# Patient Record
Sex: Female | Born: 1945 | Race: White | Hispanic: No | Smoking: Never smoker
Health system: Southern US, Community
[De-identification: ages and names within clinical notes are randomized; demographics above are authoritative.]

## PROBLEM LIST (undated history)

## (undated) ENCOUNTER — Emergency Department (HOSPITAL_COMMUNITY): Discharge status: Absent without leave | Payer: Medicare Other

## (undated) DIAGNOSIS — G459 Transient cerebral ischemic attack, unspecified: Secondary | ICD-10-CM

## (undated) DIAGNOSIS — I10 Essential (primary) hypertension: Secondary | ICD-10-CM

## (undated) DIAGNOSIS — E559 Vitamin D deficiency, unspecified: Secondary | ICD-10-CM

## (undated) DIAGNOSIS — M75100 Unspecified rotator cuff tear or rupture of unspecified shoulder, not specified as traumatic: Secondary | ICD-10-CM

## (undated) DIAGNOSIS — D649 Anemia, unspecified: Secondary | ICD-10-CM

## (undated) DIAGNOSIS — E039 Hypothyroidism, unspecified: Secondary | ICD-10-CM

## (undated) DIAGNOSIS — N200 Calculus of kidney: Secondary | ICD-10-CM

## (undated) DIAGNOSIS — E785 Hyperlipidemia, unspecified: Secondary | ICD-10-CM

## (undated) DIAGNOSIS — J309 Allergic rhinitis, unspecified: Secondary | ICD-10-CM

## (undated) DIAGNOSIS — E079 Disorder of thyroid, unspecified: Secondary | ICD-10-CM

## (undated) DIAGNOSIS — E119 Type 2 diabetes mellitus without complications: Secondary | ICD-10-CM

## (undated) DIAGNOSIS — K219 Gastro-esophageal reflux disease without esophagitis: Secondary | ICD-10-CM

## (undated) DIAGNOSIS — I4891 Unspecified atrial fibrillation: Secondary | ICD-10-CM

## (undated) HISTORY — PX: COLONOSCOPY: SHX174

## (undated) HISTORY — PX: CHOLECYSTECTOMY: SHX55

---

## 2005-10-05 ENCOUNTER — Ambulatory Visit: Payer: Self-pay | Admitting: Nurse Practitioner

## 2011-11-25 ENCOUNTER — Inpatient Hospital Stay (HOSPITAL_COMMUNITY)
Admission: EM | Admit: 2011-11-25 | Discharge: 2011-11-26 | DRG: 309 | Disposition: A | Discharge status: Home / Self Care | Payer: Medicare Other | Attending: Internal Medicine | Admitting: Internal Medicine

## 2011-11-25 ENCOUNTER — Inpatient Hospital Stay (HOSPITAL_COMMUNITY): Payer: Medicare Other

## 2011-11-25 ENCOUNTER — Encounter (HOSPITAL_COMMUNITY): Payer: Self-pay

## 2011-11-25 ENCOUNTER — Emergency Department (HOSPITAL_COMMUNITY): Payer: Medicare Other

## 2011-11-25 DIAGNOSIS — E871 Hypo-osmolality and hyponatremia: Secondary | ICD-10-CM

## 2011-11-25 DIAGNOSIS — E861 Hypovolemia: Secondary | ICD-10-CM

## 2011-11-25 DIAGNOSIS — R110 Nausea: Secondary | ICD-10-CM

## 2011-11-25 DIAGNOSIS — D649 Anemia, unspecified: Secondary | ICD-10-CM | POA: Diagnosis present

## 2011-11-25 DIAGNOSIS — I10 Essential (primary) hypertension: Secondary | ICD-10-CM

## 2011-11-25 DIAGNOSIS — Z7982 Long term (current) use of aspirin: Secondary | ICD-10-CM

## 2011-11-25 DIAGNOSIS — E785 Hyperlipidemia, unspecified: Secondary | ICD-10-CM | POA: Diagnosis present

## 2011-11-25 DIAGNOSIS — Z8673 Personal history of transient ischemic attack (TIA), and cerebral infarction without residual deficits: Secondary | ICD-10-CM

## 2011-11-25 DIAGNOSIS — Z882 Allergy status to sulfonamides status: Secondary | ICD-10-CM

## 2011-11-25 DIAGNOSIS — R112 Nausea with vomiting, unspecified: Secondary | ICD-10-CM | POA: Diagnosis present

## 2011-11-25 DIAGNOSIS — M25559 Pain in unspecified hip: Secondary | ICD-10-CM | POA: Diagnosis present

## 2011-11-25 DIAGNOSIS — E559 Vitamin D deficiency, unspecified: Secondary | ICD-10-CM | POA: Diagnosis present

## 2011-11-25 DIAGNOSIS — E119 Type 2 diabetes mellitus without complications: Secondary | ICD-10-CM | POA: Diagnosis present

## 2011-11-25 DIAGNOSIS — Z79899 Other long term (current) drug therapy: Secondary | ICD-10-CM

## 2011-11-25 DIAGNOSIS — E860 Dehydration: Secondary | ICD-10-CM

## 2011-11-25 DIAGNOSIS — I4891 Unspecified atrial fibrillation: Secondary | ICD-10-CM

## 2011-11-25 DIAGNOSIS — Z794 Long term (current) use of insulin: Secondary | ICD-10-CM

## 2011-11-25 DIAGNOSIS — R197 Diarrhea, unspecified: Secondary | ICD-10-CM | POA: Diagnosis present

## 2011-11-25 DIAGNOSIS — I369 Nonrheumatic tricuspid valve disorder, unspecified: Secondary | ICD-10-CM

## 2011-11-25 DIAGNOSIS — K219 Gastro-esophageal reflux disease without esophagitis: Secondary | ICD-10-CM | POA: Diagnosis present

## 2011-11-25 HISTORY — DX: Anemia, unspecified: D64.9

## 2011-11-25 HISTORY — DX: Essential (primary) hypertension: I10

## 2011-11-25 HISTORY — DX: Calculus of kidney: N20.0

## 2011-11-25 HISTORY — DX: Vitamin D deficiency, unspecified: E55.9

## 2011-11-25 HISTORY — DX: Gastro-esophageal reflux disease without esophagitis: K21.9

## 2011-11-25 HISTORY — DX: Hyperlipidemia, unspecified: E78.5

## 2011-11-25 HISTORY — DX: Allergic rhinitis, unspecified: J30.9

## 2011-11-25 HISTORY — DX: Transient cerebral ischemic attack, unspecified: G45.9

## 2011-11-25 HISTORY — DX: Type 2 diabetes mellitus without complications: E11.9

## 2011-11-25 LAB — CARDIAC PANEL(CRET KIN+CKTOT+MB+TROPI)
CK, MB: 2.3 ng/mL (ref 0.3–4.0)
Troponin I: <0.3 ng/mL (ref <0.30)

## 2011-11-25 LAB — CBC WITH DIFFERENTIAL/PLATELET
Basophils Absolute: 0 10*3/uL (ref 0.0–0.1)
HCT: 37.6 % (ref 36.0–46.0)
Lymphocytes Relative: 16 % (ref 12–46)
Lymphs Abs: 2.1 10*3/uL (ref 0.7–4.0)
Monocytes Absolute: 0.9 10*3/uL (ref 0.1–1.0)
Neutro Abs: 10.3 10*3/uL — ABNORMAL HIGH (ref 1.7–7.7)
RBC: 4.22 MIL/uL (ref 3.87–5.11)
RDW: 13.8 % (ref 11.5–15.5)
WBC: 13.5 10*3/uL — ABNORMAL HIGH (ref 4.0–10.5)

## 2011-11-25 LAB — URINALYSIS, ROUTINE W REFLEX MICROSCOPIC
Nitrite: NEGATIVE
Specific Gravity, Urine: 1.025 (ref 1.005–1.030)
pH: 5.5 (ref 5.0–8.0)

## 2011-11-25 LAB — URINE MICROSCOPIC-ADD ON

## 2011-11-25 LAB — BASIC METABOLIC PANEL
CO2: 23 mEq/L (ref 19–32)
Chloride: 91 mEq/L — ABNORMAL LOW (ref 96–112)
Creatinine, Ser: 0.65 mg/dL (ref 0.50–1.10)
Sodium: 130 mEq/L — ABNORMAL LOW (ref 135–145)

## 2011-11-25 LAB — TROPONIN I: Troponin I: <0.3 ng/mL (ref <0.30)

## 2011-11-25 LAB — HEPATIC FUNCTION PANEL
ALT: 9 U/L (ref 0–35)
AST: 11 U/L (ref 0–37)
Bilirubin, Direct: <0.1 mg/dL (ref 0.0–0.3)
Total Bilirubin: 0.4 mg/dL (ref 0.3–1.2)

## 2011-11-25 LAB — GLUCOSE, CAPILLARY

## 2011-11-25 MED ORDER — POTASSIUM CHLORIDE IN NACL 40-0.9 MEQ/L-% IV SOLN: INTRAVENOUS | Status: DC

## 2011-11-25 MED ORDER — MAGNESIUM SULFATE 40 MG/ML IJ SOLN
1.0000 g | INTRAMUSCULAR | Status: AC
  Administered 2011-11-25: 1 g via INTRAVENOUS
  Filled 2011-11-25: qty 50

## 2011-11-25 MED ORDER — SODIUM CHLORIDE 0.9 % IV BOLUS (SEPSIS)
500.0000 mL | Freq: Once | INTRAVENOUS | Status: AC
  Administered 2011-11-25: 500 mL via INTRAVENOUS

## 2011-11-25 MED ORDER — DIGOXIN 0.25 MG/ML IJ SOLN
0.1250 mg | Freq: Once | INTRAMUSCULAR | Status: AC
  Administered 2011-11-25: 0.125 mg via INTRAVENOUS
  Filled 2011-11-25: qty 2

## 2011-11-25 MED ORDER — METOPROLOL TARTRATE 50 MG PO TABS
25.0000 mg | ORAL_TABLET | Freq: Once | ORAL | Status: AC
  Administered 2011-11-25: 25 mg via ORAL
  Filled 2011-11-25: qty 1

## 2011-11-25 MED ORDER — CARVEDILOL 12.5 MG PO TABS
25.0000 mg | ORAL_TABLET | Freq: Two times a day (BID) | ORAL | Status: DC
  Administered 2011-11-25 – 2011-11-26 (×2): 25 mg via ORAL
  Filled 2011-11-25 (×2): qty 2

## 2011-11-25 MED ORDER — LEVALBUTEROL HCL 0.63 MG/3ML IN NEBU: 0.6300 mg | INHALATION_SOLUTION | Freq: Four times a day (QID) | RESPIRATORY_TRACT | Status: DC | PRN

## 2011-11-25 MED ORDER — PANTOPRAZOLE SODIUM 40 MG PO TBEC
40.0000 mg | DELAYED_RELEASE_TABLET | Freq: Every day | ORAL | Status: DC
  Administered 2011-11-25 – 2011-11-26 (×2): 40 mg via ORAL
  Filled 2011-11-25 (×2): qty 1

## 2011-11-25 MED ORDER — ONDANSETRON HCL 4 MG/2ML IJ SOLN: 4.0000 mg | Freq: Four times a day (QID) | INTRAMUSCULAR | Status: DC | PRN

## 2011-11-25 MED ORDER — METOPROLOL TARTRATE 1 MG/ML IV SOLN: 5.0000 mg | INTRAVENOUS | Status: DC | PRN

## 2011-11-25 MED ORDER — ENOXAPARIN SODIUM 40 MG/0.4ML ~~LOC~~ SOLN
40.0000 mg | SUBCUTANEOUS | Status: DC
  Administered 2011-11-25: 40 mg via SUBCUTANEOUS
  Filled 2011-11-25: qty 0.4

## 2011-11-25 MED ORDER — DICLOFENAC SODIUM 1 % TD GEL
Freq: Three times a day (TID) | TRANSDERMAL | Status: DC
  Administered 2011-11-25: 22:00:00 via TOPICAL
  Filled 2011-11-25: qty 100

## 2011-11-25 MED ORDER — POTASSIUM CHLORIDE CRYS ER 20 MEQ PO TBCR
30.0000 meq | EXTENDED_RELEASE_TABLET | Freq: Once | ORAL | Status: AC
  Administered 2011-11-25: 30 meq via ORAL
  Filled 2011-11-25: qty 2

## 2011-11-25 MED ORDER — MAGNESIUM SULFATE 50 % IJ SOLN: 1.0000 g | INTRAVENOUS | Status: DC

## 2011-11-25 MED ORDER — POTASSIUM CHLORIDE IN NACL 20-0.9 MEQ/L-% IV SOLN
INTRAVENOUS | Status: DC
  Administered 2011-11-25 – 2011-11-26 (×3): via INTRAVENOUS

## 2011-11-25 MED ORDER — ACETAMINOPHEN 650 MG RE SUPP: 650.0000 mg | Freq: Four times a day (QID) | RECTAL | Status: DC | PRN

## 2011-11-25 MED ORDER — MAGNESIUM SULFATE IN D5W 10-5 MG/ML-% IV SOLN: 1.0000 g | INTRAVENOUS | Status: DC

## 2011-11-25 MED ORDER — INSULIN ASPART 100 UNIT/ML ~~LOC~~ SOLN
0.0000 [IU] | Freq: Three times a day (TID) | SUBCUTANEOUS | Status: DC
  Administered 2011-11-25 – 2011-11-26 (×3): 3 [IU] via SUBCUTANEOUS

## 2011-11-25 MED ORDER — DIGOXIN 125 MCG PO TABS
0.1250 mg | ORAL_TABLET | Freq: Every day | ORAL | Status: DC
  Administered 2011-11-25 – 2011-11-26 (×2): 0.125 mg via ORAL
  Filled 2011-11-25 (×2): qty 1

## 2011-11-25 MED ORDER — ACETAMINOPHEN 325 MG PO TABS
650.0000 mg | ORAL_TABLET | Freq: Four times a day (QID) | ORAL | Status: DC | PRN
  Administered 2011-11-25 – 2011-11-26 (×2): 650 mg via ORAL
  Filled 2011-11-25 (×2): qty 2

## 2011-11-25 MED ORDER — ASPIRIN EC 81 MG PO TBEC
81.0000 mg | DELAYED_RELEASE_TABLET | Freq: Every day | ORAL | Status: DC
  Administered 2011-11-25 – 2011-11-26 (×2): 81 mg via ORAL
  Filled 2011-11-25: qty 1

## 2011-11-25 MED ORDER — MORPHINE SULFATE 2 MG/ML IJ SOLN: 2.0000 mg | INTRAMUSCULAR | Status: DC | PRN

## 2011-11-25 MED ORDER — INSULIN DETEMIR 100 UNIT/ML ~~LOC~~ SOLN
20.0000 [IU] | Freq: Every day | SUBCUTANEOUS | Status: DC
  Administered 2011-11-25: 20 [IU] via SUBCUTANEOUS
  Filled 2011-11-25: qty 10

## 2011-11-25 MED ORDER — INSULIN ASPART 100 UNIT/ML ~~LOC~~ SOLN: 0.0000 [IU] | Freq: Every day | SUBCUTANEOUS | Status: DC

## 2011-11-25 MED ORDER — METOPROLOL TARTRATE 1 MG/ML IV SOLN
5.0000 mg | Freq: Once | INTRAVENOUS | Status: AC
  Administered 2011-11-25: 5 mg via INTRAVENOUS
  Filled 2011-11-25: qty 5

## 2011-11-25 MED ORDER — HYDROCODONE-ACETAMINOPHEN 5-325 MG PO TABS
1.0000 | ORAL_TABLET | Freq: Four times a day (QID) | ORAL | Status: DC | PRN
  Administered 2011-11-25 – 2011-11-26 (×3): 1 via ORAL
  Filled 2011-11-25 (×3): qty 1

## 2011-11-25 MED ORDER — DILTIAZEM HCL 100 MG IV SOLR
5.0000 mg/h | INTRAVENOUS | Status: DC
  Filled 2011-11-25: qty 100

## 2011-11-25 MED ORDER — DICLOFENAC SODIUM 1 % TD GEL
1.0000 "application " | Freq: Three times a day (TID) | TRANSDERMAL | Status: DC
  Administered 2011-11-25: 1 via TOPICAL
  Filled 2011-11-25: qty 100

## 2011-11-25 NOTE — H&P (Signed)
TU SHIMMEL MRN: [id number] DOB/AGE: 1945-07-17 66 y.o. Primary Care Physician:KATHY PATTERSON. FNP. [facility]. Admit date: 11/25/2011 Chief Complaint: Heart racing, nausea, lightheadedness, and diarrhea. HPI: The patient is a 66 year old woman with a history significant for type 2 diabetes mellitus, hypertension, and hyperlipidemia, who presents to the hospital today with a chief complaint of a sensation of her heart racing, intermittent nausea, lightheadedness, and diarrhea. She has felt her heart racing on and off for years but it would returns to normal when she rested. She has lightheadedness and shortness of breath when she feels her heart racing. She denies associated chest pain. She began feeling nauseated 2-3 days ago when she was sitting in a hot parked car. She has not vomited. She denies abdominal pain. She has had diarrhea which started 2 or 3 days ago. She has had 4-5 loose or watery stools each day. She denies black tarry stools. Occasionally she will see bright red blood, but she thinks that it may have been secondary to drinking tomato juice. She denies recent antibiotic use or travel outside of the country. Her husband is in the nursing home, and apparently, there is a diarrheal illness for some of the residents there. She denies fever but she has had intermittent chills. She denies headache, passing out spells, and swelling in her legs or ankles. She hasn't checked her blood sugars in several days, but has not had any recent low blood sugars. She has had fatigue.  In the emergency department, she is afebrile and tachycardic. Her heart rate is ranging from 100-135 beats per minute. Her blood pressure is within normal limits. She is oxygenating 99% on room air. Her lab data are significant for a WBC of 13.5, sodium of 130, normal troponin I., glucose of 184, and magnesium of 1.4. Her EKG reveals atrial fibrillation with a heart rate of 121 beats per minute and nonspecific ST  and T-wave abnormalities. Her chest x-ray reveals no acute cardiopulmonary abnormalities. She is being admitted for further evaluation and management.  Past Medical History  Diagnosis Date  . DM (diabetes mellitus), type 2   . Hypertension   . Hyperlipidemia   . GERD (gastroesophageal reflux disease)   . Vitamin d deficiency   . Allergic rhinitis   . Nephrolithiasis   . Transient ischemic attack (TIA)   . Anemia     Past Surgical History  Procedure Date  . Cholecystectomy     Prior to Admission medications   Medication Sig Start Date End Date Taking? Authorizing Provider  aspirin EC 81 MG tablet Take 81 mg by mouth daily.   Yes Historical Provider, MD  benazepril (LOTENSIN) 40 MG tablet Take 40 mg by mouth daily.   Yes Historical Provider, MD  carvedilol (COREG) 25 MG tablet Take 25 mg by mouth 2 (two) times daily with a meal.   Yes Historical Provider, MD  chlorthalidone (HYGROTON) 25 MG tablet Take 25 mg by mouth daily.   Yes Historical Provider, MD  HYDROcodone-acetaminophen (NORCO) 5-325 MG per tablet Take 1 tablet by mouth every 6 (six) hours as needed. For pain   Yes Historical Provider, MD  insulin detemir (LEVEMIR) 100 UNIT/ML injection Inject 20 Units into the skin at bedtime.   Yes Historical Provider, MD  metFORMIN (GLUCOPHAGE) 1000 MG tablet Take 1,000 mg by mouth 2 (two) times daily with a meal.   Yes Historical Provider, MD  omeprazole (PRILOSEC) 40 MG capsule Take 40 mg by mouth daily.   Yes Historical Provider,  MD  pravastatin (PRAVACHOL) 40 MG tablet Take 80 mg by mouth daily.   Yes Historical Provider, MD  Vitamin D, Ergocalciferol, (DRISDOL) 50000 UNITS CAPS Take 50,000 Units by mouth every 7 (seven) days.   Yes Historical Provider, MD    Allergies:  Allergies  Allergen Reactions  . Angiotensin Receptor Blockers   . Calcium Channel Blockers   . Simvastatin   . Sulfa Antibiotics Itching and Rash    Family history: Her father is 18 years old. He has  diabetes mellitus. Her mother died of Alzheimer's disease at 84 years of age.   Social History: She is married. She lives in [place]. She has 3 children. She is employed as a Solicitor at [employer]. She denies tobacco, alcohol, and illicit drug use.           ROS: As above in history present illness, otherwise negative.  PHYSICAL EXAM: Blood pressure 120/64, pulse 135, temperature 97.5 F (36.4 C), temperature source Oral, resp. rate 22, height 5\' 3"  (1.6 m), weight 65.318 kg (144 lb), SpO2 99.00%. General: Pleasant alert overweight 66 year old Caucasian woman, sitting up in bed, in no acute distress. HEENT: Head is normocephalic, nontraumatic. Pupils are equal, round, and reactive to light. Extraocular movements are intact. Conjunctivae are clear. Sclerae are white. Oropharynx reveals mildly dry mucous membranes. No posterior exudates or erythema. Neck: Supple, no adenopathy, no thyromegaly, no JVD. Lungs: Decreased breath sounds in the bases, otherwise clear. Heart: Irregular, irregular, with tachycardia. Abdomen: Mildly obese, positive bowel sounds, soft, nontender, nondistended. GU and rectal are deferred. Extremities: No acute hot red joints. Pedal pulses palpable bilaterally. No pedal edema. Neurologic: She is alert and oriented x3. Cranial nerves II through XII are intact. Strength is 5 over 5 throughout. Sensation is intact. Psychiatric: She is alert and oriented x3. Speech is clear. Affect is flat. She is Manufacturing engineer.  Basic Metabolic Panel:  Basename 11/25/11 1310 11/25/11 1110  NA -- 130*  K -- 3.5  CL -- 91*  CO2 -- 23  GLUCOSE -- 184*  BUN -- 15  CREATININE -- 0.65  CALCIUM -- 9.8  MG 1.4* --  PHOS -- --   Liver Function Tests: No results found for this basename: AST:2,ALT:2,ALKPHOS:2,BILITOT:2,PROT:2,ALBUMIN:2 in the last 72 hours No results found for this basename: LIPASE:2,AMYLASE:2 in the last 72 hours No results found for this basename:  AMMONIA:2 in the last 72 hours CBC:  Basename 11/25/11 1110  WBC 13.5*  NEUTROABS 10.3*  HGB 12.7  HCT 37.6  MCV 89.1  PLT 331   Cardiac Enzymes:  Basename 11/25/11 1310 11/25/11 1110  CKTOTAL 64 --  CKMB 2.3 --  CKMBINDEX -- --  TROPONINI <0.30 <0.30   BNP: No results found for this basename: PROBNP:3 in the last 72 hours D-Dimer: No results found for this basename: DDIMER:2 in the last 72 hours CBG:  Basename 11/25/11 1209  GLUCAP 142*   Hemoglobin A1C: No results found for this basename: HGBA1C in the last 72 hours Fasting Lipid Panel: No results found for this basename: CHOL,HDL,LDLCALC,TRIG,CHOLHDL,LDLDIRECT in the last 72 hours Thyroid Function Tests: No results found for this basename: TSH,T4TOTAL,FREET4,T3FREE,THYROIDAB in the last 72 hours Anemia Panel: No results found for this basename: VITAMINB12,FOLATE,FERRITIN,TIBC,IRON,RETICCTPCT in the last 72 hours Coagulation: No results found for this basename: LABPROT:2,INR:2 in the last 72 hours Urine Drug Screen: Drugs of Abuse  No results found for this basename: labopia, cocainscrnur, labbenz, amphetmu, thcu, labbarb    Alcohol Level: No results found for  this basename: ETH:2 in the last 72 hours Urinalysis:  Basename 11/25/11 1153  COLORURINE YELLOW  LABSPEC 1.025  PHURINE 5.5  GLUCOSEU NEGATIVE  HGBUR TRACE*  BILIRUBINUR NEGATIVE  KETONESUR NEGATIVE  PROTEINUR NEGATIVE  UROBILINOGEN 0.2  NITRITE NEGATIVE  LEUKOCYTESUR SMALL*   Misc. Labs:  EKG: Atrial fibrillation, nonspecific ST and T-wave abnormalities, and with a heart rate of 121 beats per minute  No results found for this or any previous visit (from the past 240 hour(s)).   Results for orders placed during the hospital encounter of 11/25/11 (from the past 48 hour(s))  CBC WITH DIFFERENTIAL     Status: Abnormal   Collection Time   11/25/11 11:10 AM      Component Value Range Comment   WBC 13.5 (*) 4.0 - 10.5 K/uL    RBC 4.22  3.87 -  5.11 MIL/uL    Hemoglobin 12.7  12.0 - 15.0 g/dL    HCT 91.4  78.2 - 95.6 %    MCV 89.1  78.0 - 100.0 fL    MCH 30.1  26.0 - 34.0 pg    MCHC 33.8  30.0 - 36.0 g/dL    RDW 21.3  08.6 - 57.8 %    Platelets 331  150 - 400 K/uL    Neutrophils Relative 76  43 - 77 %    Neutro Abs 10.3 (*) 1.7 - 7.7 K/uL    Lymphocytes Relative 16  12 - 46 %    Lymphs Abs 2.1  0.7 - 4.0 K/uL    Monocytes Relative 7  3 - 12 %    Monocytes Absolute 0.9  0.1 - 1.0 K/uL    Eosinophils Relative 1  0 - 5 %    Eosinophils Absolute 0.2  0.0 - 0.7 K/uL    Basophils Relative 0  0 - 1 %    Basophils Absolute 0.0  0.0 - 0.1 K/uL   BASIC METABOLIC PANEL     Status: Abnormal   Collection Time   11/25/11 11:10 AM      Component Value Range Comment   Sodium 130 (*) 135 - 145 mEq/L    Potassium 3.5  3.5 - 5.1 mEq/L    Chloride 91 (*) 96 - 112 mEq/L    CO2 23  19 - 32 mEq/L    Glucose, Bld 184 (*) 70 - 99 mg/dL    BUN 15  6 - 23 mg/dL    Creatinine, Ser 4.69  0.50 - 1.10 mg/dL    Calcium 9.8  8.4 - 62.9 mg/dL    GFR calc non Af Amer >90  >90 mL/min    GFR calc Af Amer >90  >90 mL/min   TROPONIN I     Status: Normal   Collection Time   11/25/11 11:10 AM      Component Value Range Comment   Troponin I <0.30  <0.30 ng/mL   URINALYSIS, ROUTINE W REFLEX MICROSCOPIC     Status: Abnormal   Collection Time   11/25/11 11:53 AM      Component Value Range Comment   Color, Urine YELLOW  YELLOW    APPearance CLEAR  CLEAR    Specific Gravity, Urine 1.025  1.005 - 1.030    pH 5.5  5.0 - 8.0    Glucose, UA NEGATIVE  NEGATIVE mg/dL    Hgb urine dipstick TRACE (*) NEGATIVE    Bilirubin Urine NEGATIVE  NEGATIVE    Ketones, ur NEGATIVE  NEGATIVE mg/dL  Protein, ur NEGATIVE  NEGATIVE mg/dL    Urobilinogen, UA 0.2  0.0 - 1.0 mg/dL    Nitrite NEGATIVE  NEGATIVE    Leukocytes, UA SMALL (*) NEGATIVE   URINE MICROSCOPIC-ADD ON     Status: Abnormal   Collection Time   11/25/11 11:53 AM      Component Value Range Comment    Squamous Epithelial / LPF FEW (*) RARE    WBC, UA 3-6  <3 WBC/hpf    RBC / HPF 0-2  <3 RBC/hpf   GLUCOSE, CAPILLARY     Status: Abnormal   Collection Time   11/25/11 12:09 PM      Component Value Range Comment   Glucose-Capillary 142 (*) 70 - 99 mg/dL   CARDIAC PANEL(CRET KIN+CKTOT+MB+TROPI)     Status: Normal   Collection Time   11/25/11  1:10 PM      Component Value Range Comment   Total CK 64  7 - 177 U/L    CK, MB 2.3  0.3 - 4.0 ng/mL    Troponin I <0.30  <0.30 ng/mL    Relative Index RELATIVE INDEX IS INVALID  0.0 - 2.5   MAGNESIUM     Status: Abnormal   Collection Time   11/25/11  1:10 PM      Component Value Range Comment   Magnesium 1.4 (*) 1.5 - 2.5 mg/dL     Dg Chest Port 1 View  11/25/2011  *RADIOLOGY REPORT*  Clinical Data: Shortness of breath, fatigue, nausea and vomiting.  PORTABLE CHEST - 1 VIEW  Comparison: None.  Findings: Trachea is midline.  Heart size within normal limits. Lungs are clear.  No pleural fluid.  IMPRESSION: No acute findings.  Original Report Authenticated By: Reyes Ivan, M.D.    Impression:  Active Problems:  Atrial fibrillation with rapid ventricular response  Hyponatremia  Nausea  Diarrhea  Hypomagnesemia  Diabetes mellitus type II  Hypertension   1. Newly diagnosed atrial fibrillation with rapid ventricular response. Given her history, it sounds as if she may have had paroxysmal atrial fibrillation for a number of years. She is hemodynamically stable.  2. Hyponatremia. This is likely hypovolemic hyponatremia. Hypovolemia may be contributing to her rapid heart rate.  3. Diarrhea.. It is unclear if this is an infectious diarrhea or other. She is on chronic metformin therapy and has had a cholecystectomy both of which can lead to loose stools.  4. Nausea.  5. Hypomagnesemia and borderline hypokalemia.  6. Hypertension. She is treated chronically with benazepril, carvedilol, and chlorthalidone. Her blood pressure is within normal  limits, but relatively low-normal.  7. Type 2 diabetes mellitus. She is treated chronically with Levemir and metformin.  Plan: 1. The patient was to be given IV Cardizem followed by a Cardizem drip. But, she reports that she is allergic to calcium channel blockers. She reports flushing and a red face as the side effects. Instead, she was given 5 mg of IV metoprolol. 2. We'll give metoprolol 25 mg orally once. We'll give 0.125 mg of IV digoxin. Will restart Coreg twice a day. We'll continue oral dosing of digoxin. Will consider amiodarone. 3. Cardiology consult for assistance with evaluation and management. 4. IV fluid hydration. 5. We'll give IV magnesium. We'll give oral potassium. 6. Supportive treatment with as needed analgesics and as needed antiemetics. 7. We'll hold metformin and pravastatin due to the diarrhea and nausea. 8 For further evaluation, will order a lipase, hepatic function panel, react enzymes, TSH, free T4, and  2-D echocardiogram. We'll check a C. difficile PCR.        Trana Ressler 11/25/2011, 2:49 PM

## 2011-11-25 NOTE — ED Notes (Signed)
Pt reports working outside on Monday.  Pt reports having a "dizzy spell", and reports feeling like her heart was racing.  Pt reports fatigue, nausea, vomitting, and sob since then.  Pt denies chest pain, only reports tachycardia.

## 2011-11-25 NOTE — ED Notes (Signed)
Dr. Fisher in room with pt

## 2011-11-25 NOTE — Progress Notes (Signed)
*  PRELIMINARY RESULTS* Echocardiogram 2D Echocardiogram has been performed.  Caswell Corwin 11/25/2011, 2:40 PM

## 2011-11-25 NOTE — ED Provider Notes (Signed)
History   This chart was scribed for Joya Gaskins, MD by Clarita Crane. The patient was seen in room APA06/APA06. Patient's care was started at 1046.    CSN: [id number]  Arrival date & time 11/25/11  1046   First MD Initiated Contact with Patient 11/25/11 1054      Chief Complaint  Patient presents with  . Tachycardia  . Fatigue  . Nausea     HPI Amber Herman is a 66 y.o. female who presents to the Emergency Department complaining of waxing and waning moderate to severe fatigue with associated nausea, vomiting, palpitations, SOB onset 3 days ago and persistent since. Patient notes symptoms are mildly relieved with resting. Denies chest pain, coughing, syncope, diaphoresis, fever, chills, abdominal pain, back pain. Patient with h/o diabetes, HTN, HLD, GERD, TIA, anemia, thyroid disease, cholecystectomy. Denies history of irregular heart beat and MI.  Past Medical History  Diagnosis Date  . Diabetes mellitus   . Hypertension   . Hyperlipidemia   . GERD (gastroesophageal reflux disease)   . Vitamin d deficiency   . Allergic rhinitis   . Nephrolithiasis   . Transient ischemic attack (TIA)   . Anemia   . Thyroid disease     Past Surgical History  Procedure Date  . Cholecystectomy     No family history on file.  History  Substance Use Topics  . Smoking status: Never Smoker   . Smokeless tobacco: Not on file  . Alcohol Use: No    OB History    Grav Para Term Preterm Abortions TAB SAB Ect Mult Living                  Review of Systems A complete 10 system review of systems was obtained and all systems are negative except as noted in the HPI and PMH.   Allergies  Angiotensin receptor blockers; Calcium channel blockers; Simvastatin; and Sulfa antibiotics  Home Medications   Current Outpatient Rx  Name Route Sig Dispense Refill  . ASPIRIN EC 81 MG PO TBEC Oral Take 81 mg by mouth daily.    Marland Kitchen BENAZEPRIL HCL 40 MG PO TABS Oral Take 40 mg by mouth daily.      Marland Kitchen CARVEDILOL 25 MG PO TABS Oral Take 25 mg by mouth 2 (two) times daily with a meal.    . CHLORTHALIDONE 25 MG PO TABS Oral Take 25 mg by mouth daily.    Marland Kitchen HYDROCODONE-ACETAMINOPHEN 5-325 MG PO TABS Oral Take 1 tablet by mouth every 6 (six) hours as needed. For pain    . INSULIN DETEMIR 100 UNIT/ML Amesti SOLN Subcutaneous Inject 20 Units into the skin at bedtime.    Marland Kitchen METFORMIN HCL 1000 MG PO TABS Oral Take 1,000 mg by mouth 2 (two) times daily with a meal.    . OMEPRAZOLE 40 MG PO CPDR Oral Take 40 mg by mouth daily.    Marland Kitchen PRAVASTATIN SODIUM 40 MG PO TABS Oral Take 80 mg by mouth daily.    Marland Kitchen VITAMIN D (ERGOCALCIFEROL) 50000 UNITS PO CAPS Oral Take 50,000 Units by mouth every 7 (seven) days.      BP 152/64  Pulse 124  Temp 97.5 F (36.4 C) (Oral)  Resp 20  Ht 5\' 3"  (1.6 m)  Wt 144 lb (65.318 kg)  BMI 25.51 kg/m2  SpO2 98%  Physical Exam CONSTITUTIONAL: Well developed/well nourished HEAD AND FACE: Normocephalic/atraumatic EYES: EOMI/PERRL ENMT: Mucous membranes moist NECK: supple no meningeal signs SPINE:entire spine nontender WU:[id number],  S1/S2 noted, no murmurs/rubs/gallops noted, irregular heart beat LUNGS: Lungs are clear to auscultation bilaterally, no apparent distress ABDOMEN: soft, nontender, no rebound or guarding NEURO: Pt is awake/alert, moves all extremitiesx4 EXTREMITIES: pulses normal, full ROM SKIN: warm, color normal PSYCH: no abnormalities of mood noted  ED Course  Procedures   CRITICAL CARE Performed by: Joya Gaskins   Total critical care time: 37  Critical care time was exclusive of separately billable procedures and treating other patients.  Critical care was necessary to treat or prevent imminent or life-threatening deterioration.  Critical care was time spent personally by me on the following activities: development of treatment plan with patient and/or surrogate as well as nursing, discussions with consultants, evaluation of patient's  response to treatment, examination of patient, obtaining history from patient or surrogate, ordering and performing treatments and interventions, ordering and review of laboratory studies, ordering and review of radiographic studies, pulse oximetry and re-evaluation of patient's condition.   DIAGNOSTIC STUDIES: Oxygen Saturation is 98% on room air, normal by my interpretation.    COORDINATION OF CARE:    Results for orders placed during the hospital encounter of 11/25/11  CBC WITH DIFFERENTIAL      Component Value Range   WBC 13.5 (*) 4.0 - 10.5 K/uL   RBC 4.22  3.87 - 5.11 MIL/uL   Hemoglobin 12.7  12.0 - 15.0 g/dL   HCT 96.2  95.2 - 84.1 %   MCV 89.1  78.0 - 100.0 fL   MCH 30.1  26.0 - 34.0 pg   MCHC 33.8  30.0 - 36.0 g/dL   RDW 32.4  40.1 - 02.7 %   Platelets 331  150 - 400 K/uL   Neutrophils Relative 76  43 - 77 %   Neutro Abs 10.3 (*) 1.7 - 7.7 K/uL   Lymphocytes Relative 16  12 - 46 %   Lymphs Abs 2.1  0.7 - 4.0 K/uL   Monocytes Relative 7  3 - 12 %   Monocytes Absolute 0.9  0.1 - 1.0 K/uL   Eosinophils Relative 1  0 - 5 %   Eosinophils Absolute 0.2  0.0 - 0.7 K/uL   Basophils Relative 0  0 - 1 %   Basophils Absolute 0.0  0.0 - 0.1 K/uL  BASIC METABOLIC PANEL      Component Value Range   Sodium 130 (*) 135 - 145 mEq/L   Potassium 3.5  3.5 - 5.1 mEq/L   Chloride 91 (*) 96 - 112 mEq/L   CO2 23  19 - 32 mEq/L   Glucose, Bld 184 (*) 70 - 99 mg/dL   BUN 15  6 - 23 mg/dL   Creatinine, Ser 2.53  0.50 - 1.10 mg/dL   Calcium 9.8  8.4 - 66.4 mg/dL   GFR calc non Af Amer >90  >90 mL/min   GFR calc Af Amer >90  >90 mL/min  TROPONIN I      Component Value Range   Troponin I <0.30  <0.30 ng/mL  URINALYSIS, ROUTINE W REFLEX MICROSCOPIC      Component Value Range   Color, Urine YELLOW  YELLOW   APPearance CLEAR  CLEAR   Specific Gravity, Urine 1.025  1.005 - 1.030   pH 5.5  5.0 - 8.0   Glucose, UA NEGATIVE  NEGATIVE mg/dL   Hgb urine dipstick TRACE (*) NEGATIVE    Bilirubin Urine NEGATIVE  NEGATIVE   Ketones, ur NEGATIVE  NEGATIVE mg/dL   Protein, ur NEGATIVE  NEGATIVE  mg/dL   Urobilinogen, UA 0.2  0.0 - 1.0 mg/dL   Nitrite NEGATIVE  NEGATIVE   Leukocytes, UA SMALL (*) NEGATIVE  GLUCOSE, CAPILLARY      Component Value Range   Glucose-Capillary 142 (*) 70 - 99 mg/dL  URINE MICROSCOPIC-ADD ON      Component Value Range   Squamous Epithelial / LPF FEW (*) RARE   WBC, UA 3-6  <3 WBC/hpf   RBC / HPF 0-2  <3 RBC/hpf    Dg Chest Port 1 View  11/25/2011  *RADIOLOGY REPORT*  Clinical Data: Shortness of breath, fatigue, nausea and vomiting.  PORTABLE CHEST - 1 VIEW  Comparison: None.  Findings: Trachea is midline.  Heart size within normal limits. Lungs are clear.  No pleural fluid.  IMPRESSION: No acute findings.  Original Report Authenticated By: Reyes Ivan, M.D.  12:45 PM Pt dehydrated, given IV fluids but still tachycardic with afib Has allergy to CA channel blockers Will give lopressor due to persistent afib with rvr Pt stabilized in the ED Will admit D/w dr Sherrie Mustache for admission   MDM  Nursing notes including past medical history and social history reviewed and considered in documentation All labs/vitals reviewed and considered xrays reviewed and considered     Date: 11/25/2011  Rate: 121  Rhythm: atrial fibrillation  QRS Axis: normal  Intervals: normal  ST/T Wave abnormalities: nonspecific ST changes  Conduction Disutrbances:none  Narrative Interpretation:   Old EKG Reviewed: none available     I personally performed the services described in this documentation, which was scribed in my presence. The recorded information has been reviewed and considered.      Joya Gaskins, MD 11/25/11 1246

## 2011-11-26 ENCOUNTER — Other Ambulatory Visit: Payer: Self-pay

## 2011-11-26 ENCOUNTER — Encounter (HOSPITAL_COMMUNITY): Payer: Self-pay | Admitting: Cardiology

## 2011-11-26 DIAGNOSIS — E118 Type 2 diabetes mellitus with unspecified complications: Secondary | ICD-10-CM

## 2011-11-26 DIAGNOSIS — E119 Type 2 diabetes mellitus without complications: Secondary | ICD-10-CM

## 2011-11-26 DIAGNOSIS — I4891 Unspecified atrial fibrillation: Secondary | ICD-10-CM

## 2011-11-26 DIAGNOSIS — I10 Essential (primary) hypertension: Secondary | ICD-10-CM

## 2011-11-26 DIAGNOSIS — D649 Anemia, unspecified: Secondary | ICD-10-CM | POA: Diagnosis not present

## 2011-11-26 DIAGNOSIS — E1165 Type 2 diabetes mellitus with hyperglycemia: Secondary | ICD-10-CM

## 2011-11-26 DIAGNOSIS — E871 Hypo-osmolality and hyponatremia: Secondary | ICD-10-CM

## 2011-11-26 LAB — CBC
MCH: 29.7 pg (ref 26.0–34.0)
MCHC: 33 g/dL (ref 30.0–36.0)
Platelets: 261 10*3/uL (ref 150–400)
RBC: 3.43 MIL/uL — ABNORMAL LOW (ref 3.87–5.11)
RDW: 14.2 % (ref 11.5–15.5)

## 2011-11-26 LAB — COMPREHENSIVE METABOLIC PANEL
AST: 9 U/L (ref 0–37)
Alkaline Phosphatase: 41 U/L (ref 39–117)
CO2: 26 mEq/L (ref 19–32)
Chloride: 104 mEq/L (ref 96–112)
Creatinine, Ser: 0.6 mg/dL (ref 0.50–1.10)
GFR calc non Af Amer: >90 mL/min (ref >90)
Total Bilirubin: 0.4 mg/dL (ref 0.3–1.2)

## 2011-11-26 LAB — MAGNESIUM: Magnesium: 1.7 mg/dL (ref 1.5–2.5)

## 2011-11-26 LAB — GLUCOSE, CAPILLARY: Glucose-Capillary: 175 mg/dL — ABNORMAL HIGH (ref 70–99)

## 2011-11-26 LAB — CARDIAC PANEL(CRET KIN+CKTOT+MB+TROPI)
CK, MB: 2 ng/mL (ref 0.3–4.0)
Total CK: 53 U/L (ref 7–177)

## 2011-11-26 LAB — PROTIME-INR
INR: 1.06 (ref 0.00–1.49)
Prothrombin Time: 14 seconds (ref 11.6–15.2)

## 2011-11-26 LAB — HEMOGLOBIN A1C: Hgb A1c MFr Bld: 7.5 % — ABNORMAL HIGH (ref <5.7)

## 2011-11-26 MED ORDER — MULTI-VITAMIN/MINERALS PO TABS: 1.0000 | ORAL_TABLET | Freq: Every day | ORAL | Status: DC

## 2011-11-26 MED ORDER — WARFARIN SODIUM 5 MG PO TABS
5.0000 mg | ORAL_TABLET | Freq: Once | ORAL | Status: AC
  Administered 2011-11-26: 5 mg via ORAL
  Filled 2011-11-26: qty 1

## 2011-11-26 MED ORDER — DICLOFENAC SODIUM 1 % TD GEL: 1.0000 "application " | Freq: Three times a day (TID) | TRANSDERMAL | Status: DC | PRN

## 2011-11-26 MED ORDER — ENOXAPARIN SODIUM 80 MG/0.8ML ~~LOC~~ SOLN
70.0000 mg | Freq: Two times a day (BID) | SUBCUTANEOUS | Status: DC
  Administered 2011-11-26: 70 mg via SUBCUTANEOUS
  Filled 2011-11-26: qty 0.8

## 2011-11-26 MED ORDER — WARFARIN SODIUM 2.5 MG PO TABS: 5.0000 mg | ORAL_TABLET | Freq: Once | ORAL | Status: DC

## 2011-11-26 MED ORDER — DIGOXIN 125 MCG PO TABS: 0.1250 mg | ORAL_TABLET | Freq: Every day | ORAL | Status: DC

## 2011-11-26 MED ORDER — WARFARIN VIDEO
Freq: Once | Status: AC
Start: 2011-11-26 — End: 2011-11-26
  Administered 2011-11-26: 12:00:00

## 2011-11-26 MED ORDER — DIGOXIN 0.25 MG/ML IJ SOLN
0.1250 mg | INTRAMUSCULAR | Status: AC
  Administered 2011-11-26: 0.125 mg via INTRAVENOUS
  Filled 2011-11-26: qty 2

## 2011-11-26 MED ORDER — HYDROCODONE-ACETAMINOPHEN 5-325 MG PO TABS: 1.0000 | ORAL_TABLET | Freq: Four times a day (QID) | ORAL | Status: DC | PRN

## 2011-11-26 MED ORDER — CARVEDILOL 12.5 MG PO TABS
37.5000 mg | ORAL_TABLET | Freq: Two times a day (BID) | ORAL | Status: DC
  Administered 2011-11-26: 37.5 mg via ORAL
  Filled 2011-11-26: qty 1
  Filled 2011-11-26: qty 3

## 2011-11-26 MED ORDER — PATIENT'S GUIDE TO USING COUMADIN BOOK
Freq: Once | Status: AC
Start: 2011-11-26 — End: 2011-11-26
  Administered 2011-11-26: 12:00:00
  Filled 2011-11-26: qty 1

## 2011-11-26 MED ORDER — WARFARIN - PHARMACIST DOSING INPATIENT: Freq: Every day | Status: DC

## 2011-11-26 MED ORDER — CARVEDILOL 25 MG PO TABS: 37.5000 mg | ORAL_TABLET | Freq: Two times a day (BID) | ORAL | Status: DC

## 2011-11-26 MED ORDER — CARVEDILOL 12.5 MG PO TABS
12.5000 mg | ORAL_TABLET | Freq: Once | ORAL | Status: AC
  Administered 2011-11-26: 12.5 mg via ORAL

## 2011-11-26 MED ORDER — INSULIN DETEMIR 100 UNIT/ML ~~LOC~~ SOLN: 25.0000 [IU] | Freq: Every day | SUBCUTANEOUS | Status: DC

## 2011-11-26 NOTE — Consult Note (Signed)
Clinical Summary Amber Herman is a 66 y.o.female Presently admitted to the hospital for further evaluation of palpitations, lightheadedness, and recent nausea and diarrhea. She has been diagnosed with atrial fibrillation, likely paroxysmal, with description of palpitations occurring over several months. She has had persistent atrial fibrillation with rapid ventricular response during admission, in association with electrolyte abnormalities. She missed 3 doses of her typical Coreg, has been placed back on Coreg with addition of digoxin. She does report an allergy to calcium channel blockers. This morning she states that she feels better, heart rates are still in the 100 to 110 range at rest.  Echocardiogram from yesterday revealed an LVEF of 60-65% with mild left atrial enlargement, no major valvular abnormalities. ECG review showing atrial fibrillation with borderline increased voltage and nonspecific ST-T changes.  Thromboembolic risk was reviewed with CHADS2 score of 4 and CHADS-VASC score of 6. She reports no major bleeding problems, has no known hepatic or renal disease.   Allergies  Allergen Reactions  . Angiotensin Receptor Blockers   . Calcium Channel Blockers   . Simvastatin   . Sulfa Antibiotics Itching and Rash    Medications    . aspirin EC  81 mg Oral Daily  . carvedilol  37.5 mg Oral BID WC  . diclofenac sodium  1 application Topical TID  . digoxin  0.125 mg Intravenous Once  . digoxin  0.125 mg Oral Daily  . enoxaparin (LOVENOX) injection  70 mg Subcutaneous Q12H  . insulin aspart  0-15 Units Subcutaneous TID WC  . insulin aspart  0-5 Units Subcutaneous QHS  . insulin detemir  20 Units Subcutaneous QHS  . magnesium sulfate  1 g Intravenous NOW  . metoprolol  5 mg Intravenous Once  . metoprolol  25 mg Oral Once  . pantoprazole  40 mg Oral Q1200  . patient's guide to using coumadin book   Does not apply Once  . potassium chloride  30 mEq Oral Once  . sodium chloride   500 mL Intravenous Once  . warfarin  5 mg Oral ONCE-1800  . warfarin   Does not apply Once  . Warfarin - Pharmacist Dosing Inpatient   Does not apply q1800  . DISCONTD: carvedilol  25 mg Oral BID WC  . DISCONTD: diclofenac sodium   Topical TID  . DISCONTD: enoxaparin  40 mg Subcutaneous Q24H  . DISCONTD: magnesium sulfate LVP 250-500 ml  1 g Intravenous NOW  . DISCONTD: magnesium sulfate 1 - 4 g bolus IVPB  1 g Intravenous NOW    Past Medical History  Diagnosis Date  . DM (diabetes mellitus), type 2   . Hypertension   . Hyperlipidemia   . GERD (gastroesophageal reflux disease)   . Vitamin d deficiency   . Allergic rhinitis   . Nephrolithiasis   . Transient ischemic attack (TIA)   . Anemia   . Palpitations     Past Surgical History  Procedure Date  . Cholecystectomy     History reviewed. No pertinent family history.  Social History Amber Herman reports that she has been smoking Cigarettes.  She does not have any smokeless tobacco history on file. Ms. Lehrmann reports that she does not drink alcohol.  Review of Systems Reports no falls. No exertional chest pain. No regular exercise at this time. Reports stable appetite. No cough, fevers or chills. Otherwise negative except as outlined.  Physical Examination Blood pressure 116/84, pulse 90, temperature 98.1 F (36.7 C), temperature source Oral, resp. rate 19, height  5\' 3"  (1.6 m), weight 144 lb (65.318 kg), SpO2 96.00%.  Overweight woman in no acute distress. HEENT: Conjunctiva and lids normal, oropharynx clear with moist mucosa. Neck: Supple, no elevated JVP or carotid bruits, no thyromegaly. Lungs: Clear to auscultation, nonlabored breathing at rest. Cardiac: Irregularly irregular, no S3 or significant systolic murmur, no pericardial rub. Abdomen: Soft, nontender, bowel sounds present, no guarding or rebound. Extremities: No pitting edema, distal pulses 2+. Skin: Warm and dry. Musculoskeletal: No  kyphosis. Neuropsychiatric: Alert and oriented x3, affect grossly appropriate.   Lab Results Basic Metabolic Panel:  Lab 11/26/11 1610 11/26/11 0555 11/25/11 1310 11/25/11 1110  NA -- 138 -- 130*  K -- 4.0 -- 3.5  CL -- 104 -- 91*  CO2 -- 26 -- 23  GLUCOSE -- 127* -- 184*  BUN -- 7 -- 15  CREATININE -- 0.60 -- 0.65  CALCIUM -- 8.8 -- 9.8  MG 1.7 -- 1.4* --  PHOS -- -- -- --   Liver Function Tests:  Lab 11/26/11 0555 11/25/11 1512  AST 9 11  ALT 8 9  ALKPHOS 41 44  BILITOT 0.4 0.4  PROT 6.2 6.4  ALBUMIN 3.1* 3.3*    Lab 11/25/11 1512  LIPASE 33  AMYLASE --   CBC:  Lab 11/26/11 0555 11/25/11 1110  WBC 8.8 13.5*  NEUTROABS -- 10.3*  HGB 10.2* 12.7  HCT 30.9* 37.6  MCV 90.1 89.1  PLT 261 331   Cardiac Enzymes:  Lab 11/26/11 0032 11/25/11 1835 11/25/11 1310 11/25/11 1110  CKTOTAL 53 61 64 --  CKMB 2.0 2.3 2.3 --  CKMBINDEX -- -- -- --  TROPONINI <0.30 <0.30 <0.30 <0.30   CBG:  Lab 11/26/11 0737 11/25/11 2122 11/25/11 1646 11/25/11 1209  GLUCAP 104* 167* 156* 142*    Imaging Chest x-ray from 6/26 reports no acute findings.   Impression  1. Newly diagnosed atrial fibrillation, although likely paroxysmal based on longer standing history of intermittent palpitations. CHADS2 score is 4 and CHADS-VASC score is 6, with history of diabetes mellitus, hypertension, and apparent stroke in the past. LV function is normal with mild left atrial enlargement by echocardiogram. Cardiac markers are normal. She feels better symptomatically, although heart rate is not optimally controlled as yet.  2. Electrolyte abnormalities including hyponatremia, hypomagnesemia, hypokalemia. Improved with intervention.  3. Hypertension. Blood pressure looks to be well controlled.  4. Type 2 diabetes mellitus.  5. Reported history of TIA versus mild stroke in the past.  Recommendations  Situation discussed with the patient including implications of atrial fibrillation relating to  management strategies, thromboembolic risk, and followup. Recommend increasing Coreg to 37.5 mg twice daily and continue digoxin, aiming for better heart rate control. Anticoagulant therapy should be considered for stroke risk reduction. Coumadin is being initiated with plan for her to follow up in our Coumadin clinic on Monday. It is not clear that we will need to proceed with cardioversion, although this can be considered depending on how she does symptomatically. She may be able to be discharged later this afternoon if heart rate comes down more, otherwise would aim for tomorrow morning.  Jonelle Sidle, M.D., F.A.C.C.

## 2011-11-26 NOTE — Progress Notes (Signed)
UR Chart Review Completed  

## 2011-11-26 NOTE — Consult Note (Addendum)
ANTICOAGULATION CONSULT NOTE - Initial Consult  Pharmacy Consult for Lovenox and Coumadin Indication: atrial fibrillation  Allergies  Allergen Reactions  . Angiotensin Receptor Blockers   . Calcium Channel Blockers   . Simvastatin   . Sulfa Antibiotics Itching and Rash   Patient Measurements: Height: 5\' 3"  (160 cm) Weight: 144 lb (65.318 kg) IBW/kg (Calculated) : 52.4   Vital Signs: Temp: 98.1 F (36.7 C) (06/27 0606) BP: 116/84 mmHg (06/27 0606) Pulse Rate: 91  (06/27 0606)  Labs:  Basename 11/26/11 0555 11/26/11 0032 11/25/11 1835 11/25/11 1310 11/25/11 1115 11/25/11 1110  HGB 10.2* -- -- -- -- 12.7  HCT 30.9* -- -- -- -- 37.6  PLT 261 -- -- -- -- 331  APTT -- -- -- -- 32 --  LABPROT -- -- -- -- 13.0 --  INR -- -- -- -- 0.96 --  HEPARINUNFRC -- -- -- -- -- --  CREATININE 0.60 -- -- -- -- 0.65  CKTOTAL -- 53 61 64 -- --  CKMB -- 2.0 2.3 2.3 -- --  TROPONINI -- <0.30 <0.30 <0.30 -- --   Estimated Creatinine Clearance: 62.9 ml/min (by C-G formula based on Cr of 0.6).  Medical History: Past Medical History  Diagnosis Date  . DM (diabetes mellitus), type 2   . Hypertension   . Hyperlipidemia   . GERD (gastroesophageal reflux disease)   . Vitamin d deficiency   . Allergic rhinitis   . Nephrolithiasis   . Transient ischemic attack (TIA)   . Anemia   . Palpitations    Medications:  Scheduled:    . aspirin EC  81 mg Oral Daily  . carvedilol  25 mg Oral BID WC  . diclofenac sodium  1 application Topical TID  . digoxin  0.125 mg Intravenous Once  . digoxin  0.125 mg Oral Daily  . enoxaparin (LOVENOX) injection  70 mg Subcutaneous Q12H  . insulin aspart  0-15 Units Subcutaneous TID WC  . insulin aspart  0-5 Units Subcutaneous QHS  . insulin detemir  20 Units Subcutaneous QHS  . magnesium sulfate  1 g Intravenous NOW  . metoprolol  5 mg Intravenous Once  . metoprolol  25 mg Oral Once  . pantoprazole  40 mg Oral Q1200  . potassium chloride  30 mEq Oral Once    . sodium chloride  500 mL Intravenous Once  . DISCONTD: diclofenac sodium   Topical TID  . DISCONTD: enoxaparin  40 mg Subcutaneous Q24H  . DISCONTD: magnesium sulfate LVP 250-500 ml  1 g Intravenous NOW  . DISCONTD: magnesium sulfate 1 - 4 g bolus IVPB  1 g Intravenous NOW   Assessment: Renal fxn OK INR 0.96 on 6/26  Goal of Therapy:  Anti-Xa level 0.6-1.2 units/ml 4hrs after LMWH dose given Monitor platelets by anticoagulation protocol: Yes INR 2-3   Plan:  Lovenox 1mg /Kg sq q12hrs until INR therapeutic Coumadin 5mg  today x 1 INR daily CBC per protocol  Margo Aye, Valli Randol A 11/26/2011,9:17 AM

## 2011-11-26 NOTE — Progress Notes (Signed)
Ambulated patient in hallways with daughter; pt tolerated well; heart rate increased slightly from 110s to as high as 144; Dr. Sherrie Mustache aware, IV digoxin given; pt states she feel fine, no complaints at this time

## 2011-11-26 NOTE — Discharge Summary (Signed)
Physician Discharge Summary  Amber Herman MRN: [id number] DOB/AGE: 1945/07/06 66 y.o.  PCP: Ninfa Linden, NP   Admit date: 11/25/2011 Discharge date: 11/26/2011  Discharge Diagnoses:  1. Newly diagnosed paroxysmal atrial fibrillation with rapid ventricular response. The patient's rhythm converted to normal sinus prior to discharge. Coumadin started. INR at the time of discharge 1.06. 2. Hyponatremia secondary to hypovolemia. Resolved. 3. Hypomagnesemia, resolved. 4. Diarrhea. Resolved. 5. Nausea. Resolved. 6. Type 2 diabetes mellitus. Hemoglobin A1c 7.5. 7. Hypertension with low normal blood pressures during the hospitalization. 8. Left hip pain. 9. Anemia. Outpatient GI evaluation is recommended. Defer appointment to primary care provider. Hemoglobin at the time of discharge 10.2.   Medication List  As of 11/26/2011  5:20 PM   STOP taking these medications         aspirin EC 81 MG tablet      benazepril 40 MG tablet      chlorthalidone 25 MG tablet         TAKE these medications         carvedilol 25 MG tablet   Commonly known as: COREG   Take 1.5 tablets (37.5 mg total) by mouth 2 (two) times daily with a meal.      diclofenac sodium 1 % Gel   Commonly known as: VOLTAREN   Apply 1 application topically 3 (three) times daily as needed (For joint pain).      digoxin 0.125 MG tablet   Commonly known as: LANOXIN   Take 1 tablet (0.125 mg total) by mouth daily. For heart rate control.      HYDROcodone-acetaminophen 5-325 MG per tablet   Commonly known as: NORCO   Take 1 tablet by mouth every 6 (six) hours as needed. For pain      insulin detemir 100 UNIT/ML injection   Commonly known as: LEVEMIR   Inject 25 Units into the skin at bedtime.      metFORMIN 1000 MG tablet   Commonly known as: GLUCOPHAGE   Take 1,000 mg by mouth 2 (two) times daily with a meal.      multivitamin with minerals tablet   Take 1 tablet by mouth daily.      omeprazole 40 MG  capsule   Commonly known as: PRILOSEC   Take 40 mg by mouth daily.      pravastatin 40 MG tablet   Commonly known as: PRAVACHOL   Take 80 mg by mouth daily.      Vitamin D (Ergocalciferol) 50000 UNITS Caps   Commonly known as: DRISDOL   Take 50,000 Units by mouth every 7 (seven) days.      warfarin 2.5 MG tablet   Commonly known as: COUMADIN   Take 2 tablets (5 mg total) by mouth one time only at 6 PM. STARTING TOMORROW. THE DOSING IS SUBJECT TO CHANGE.            Discharge Condition: Improved.  Disposition: Home.   Consults: Nona Dell, M.D.   Significant Diagnostic Studies: Dg Hip Complete Left  11/25/2011  *RADIOLOGY REPORT*  Clinical Data: History of left hip pain.  No history of injury.  LEFT HIP - COMPLETE 2+ VIEW  Comparison: None.  Findings: SI joints appear intact.  Hip joint spaces are preserved. No fracture, dislocation, bony destruction, or calcific bursitis is evident.  There is very minimal acetabular marginal osteophyte spurring.  IMPRESSION: Very minimal spurring of the acetabulum.  No fracture or bony destruction.  No calcific bursitis.  Original Report Authenticated By: Crawford Givens, M.D.   Dg Chest Port 1 View  11/25/2011  *RADIOLOGY REPORT*  Clinical Data: Shortness of breath, fatigue, nausea and vomiting.  PORTABLE CHEST - 1 VIEW  Comparison: None.  Findings: Trachea is midline.  Heart size within normal limits. Lungs are clear.  No pleural fluid.  IMPRESSION: No acute findings.  Original Report Authenticated By: Reyes Ivan, M.D.   ECHO:Study Conclusions  - Left ventricle: The cavity size was normal. Systolic function was normal. The estimated ejection fraction was in the range of 60% to 65%. Wall motion was normal; there were no regional wall motion abnormalities. - Left atrium: The atrium was mildly dilated. - Atrial septum: No defect or patent foramen ovale was identified. - Pericardium, extracardiac: A trivial pericardial effusion was  identified. Transthoracic echocardiography. M-mode, complete 2D, spectral Doppler, and color Doppler. Height: Height: 162.6cm. Height: 64in. Weight: Weight: 65.3kg. Weight: 143.7lb. Body mass index: BMI: 24.7kg/m^2. Body surface area: BSA: 1.2m^2. Patient status: Inpatient. Location: Emergency department.     Microbiology: No results found for this or any previous visit (from the past 240 hour(s)).   Labs: Results for orders placed during the hospital encounter of 11/25/11 (from the past 48 hour(s))  CBC WITH DIFFERENTIAL     Status: Abnormal   Collection Time   11/25/11 11:10 AM      Component Value Range Comment   WBC 13.5 (*) 4.0 - 10.5 K/uL    RBC 4.22  3.87 - 5.11 MIL/uL    Hemoglobin 12.7  12.0 - 15.0 g/dL    HCT 45.4  09.8 - 11.9 %    MCV 89.1  78.0 - 100.0 fL    MCH 30.1  26.0 - 34.0 pg    MCHC 33.8  30.0 - 36.0 g/dL    RDW 14.7  82.9 - 56.2 %    Platelets 331  150 - 400 K/uL    Neutrophils Relative 76  43 - 77 %    Neutro Abs 10.3 (*) 1.7 - 7.7 K/uL    Lymphocytes Relative 16  12 - 46 %    Lymphs Abs 2.1  0.7 - 4.0 K/uL    Monocytes Relative 7  3 - 12 %    Monocytes Absolute 0.9  0.1 - 1.0 K/uL    Eosinophils Relative 1  0 - 5 %    Eosinophils Absolute 0.2  0.0 - 0.7 K/uL    Basophils Relative 0  0 - 1 %    Basophils Absolute 0.0  0.0 - 0.1 K/uL   BASIC METABOLIC PANEL     Status: Abnormal   Collection Time   11/25/11 11:10 AM      Component Value Range Comment   Sodium 130 (*) 135 - 145 mEq/L    Potassium 3.5  3.5 - 5.1 mEq/L    Chloride 91 (*) 96 - 112 mEq/L    CO2 23  19 - 32 mEq/L    Glucose, Bld 184 (*) 70 - 99 mg/dL    BUN 15  6 - 23 mg/dL    Creatinine, Ser 1.30  0.50 - 1.10 mg/dL    Calcium 9.8  8.4 - 86.5 mg/dL    GFR calc non Af Amer >90  >90 mL/min    GFR calc Af Amer >90  >90 mL/min   TROPONIN I     Status: Normal   Collection Time   11/25/11 11:10 AM      Component Value Range Comment  Troponin I <0.30  <0.30 ng/mL   APTT     Status:  Normal   Collection Time   11/25/11 11:15 AM      Component Value Range Comment   aPTT 32  24 - 37 seconds   PROTIME-INR     Status: Normal   Collection Time   11/25/11 11:15 AM      Component Value Range Comment   Prothrombin Time 13.0  11.6 - 15.2 seconds    INR 0.96  0.00 - 1.49   URINALYSIS, ROUTINE W REFLEX MICROSCOPIC     Status: Abnormal   Collection Time   11/25/11 11:53 AM      Component Value Range Comment   Color, Urine YELLOW  YELLOW    APPearance CLEAR  CLEAR    Specific Gravity, Urine 1.025  1.005 - 1.030    pH 5.5  5.0 - 8.0    Glucose, UA NEGATIVE  NEGATIVE mg/dL    Hgb urine dipstick TRACE (*) NEGATIVE    Bilirubin Urine NEGATIVE  NEGATIVE    Ketones, ur NEGATIVE  NEGATIVE mg/dL    Protein, ur NEGATIVE  NEGATIVE mg/dL    Urobilinogen, UA 0.2  0.0 - 1.0 mg/dL    Nitrite NEGATIVE  NEGATIVE    Leukocytes, UA SMALL (*) NEGATIVE   URINE MICROSCOPIC-ADD ON     Status: Abnormal   Collection Time   11/25/11 11:53 AM      Component Value Range Comment   Squamous Epithelial / LPF FEW (*) RARE    WBC, UA 3-6  <3 WBC/hpf    RBC / HPF 0-2  <3 RBC/hpf   GLUCOSE, CAPILLARY     Status: Abnormal   Collection Time   11/25/11 12:09 PM      Component Value Range Comment   Glucose-Capillary 142 (*) 70 - 99 mg/dL   CARDIAC PANEL(CRET KIN+CKTOT+MB+TROPI)     Status: Normal   Collection Time   11/25/11  1:10 PM      Component Value Range Comment   Total CK 64  7 - 177 U/L    CK, MB 2.3  0.3 - 4.0 ng/mL    Troponin I <0.30  <0.30 ng/mL    Relative Index RELATIVE INDEX IS INVALID  0.0 - 2.5   MAGNESIUM     Status: Abnormal   Collection Time   11/25/11  1:10 PM      Component Value Range Comment   Magnesium 1.4 (*) 1.5 - 2.5 mg/dL   TSH     Status: Normal   Collection Time   11/25/11  1:10 PM      Component Value Range Comment   TSH 2.216  0.350 - 4.500 uIU/mL   T4, FREE     Status: Normal   Collection Time   11/25/11  1:10 PM      Component Value Range Comment   Free T4  1.11  0.80 - 1.80 ng/dL   LIPASE, BLOOD     Status: Normal   Collection Time   11/25/11  3:12 PM      Component Value Range Comment   Lipase 33  11 - 59 U/L   HEPATIC FUNCTION PANEL     Status: Abnormal   Collection Time   11/25/11  3:12 PM      Component Value Range Comment   Total Protein 6.4  6.0 - 8.3 g/dL    Albumin 3.3 (*) 3.5 - 5.2 g/dL    AST 11  0 -  37 U/L    ALT 9  0 - 35 U/L    Alkaline Phosphatase 44  39 - 117 U/L    Total Bilirubin 0.4  0.3 - 1.2 mg/dL    Bilirubin, Direct <1.6  0.0 - 0.3 mg/dL    Indirect Bilirubin NOT CALCULATED  0.3 - 0.9 mg/dL   GLUCOSE, CAPILLARY     Status: Abnormal   Collection Time   11/25/11  4:46 PM      Component Value Range Comment   Glucose-Capillary 156 (*) 70 - 99 mg/dL    Comment 1 Documented in Chart      Comment 2 Notify RN     CARDIAC PANEL(CRET KIN+CKTOT+MB+TROPI)     Status: Normal   Collection Time   11/25/11  6:35 PM      Component Value Range Comment   Total CK 61  7 - 177 U/L    CK, MB 2.3  0.3 - 4.0 ng/mL    Troponin I <0.30  <0.30 ng/mL    Relative Index RELATIVE INDEX IS INVALID  0.0 - 2.5   GLUCOSE, CAPILLARY     Status: Abnormal   Collection Time   11/25/11  9:22 PM      Component Value Range Comment   Glucose-Capillary 167 (*) 70 - 99 mg/dL    Comment 1 Notify RN     CARDIAC PANEL(CRET KIN+CKTOT+MB+TROPI)     Status: Normal   Collection Time   11/26/11 12:32 AM      Component Value Range Comment   Total CK 53  7 - 177 U/L    CK, MB 2.0  0.3 - 4.0 ng/mL    Troponin I <0.30  <0.30 ng/mL    Relative Index RELATIVE INDEX IS INVALID  0.0 - 2.5   COMPREHENSIVE METABOLIC PANEL     Status: Abnormal   Collection Time   11/26/11  5:55 AM      Component Value Range Comment   Sodium 138  135 - 145 mEq/L DELTA CHECK NOTED   Potassium 4.0  3.5 - 5.1 mEq/L    Chloride 104  96 - 112 mEq/L    CO2 26  19 - 32 mEq/L    Glucose, Bld 127 (*) 70 - 99 mg/dL    BUN 7  6 - 23 mg/dL    Creatinine, Ser 1.09  0.50 - 1.10 mg/dL     Calcium 8.8  8.4 - 10.5 mg/dL    Total Protein 6.2  6.0 - 8.3 g/dL    Albumin 3.1 (*) 3.5 - 5.2 g/dL    AST 9  0 - 37 U/L    ALT 8  0 - 35 U/L    Alkaline Phosphatase 41  39 - 117 U/L    Total Bilirubin 0.4  0.3 - 1.2 mg/dL    GFR calc non Af Amer >90  >90 mL/min    GFR calc Af Amer >90  >90 mL/min   CBC     Status: Abnormal   Collection Time   11/26/11  5:55 AM      Component Value Range Comment   WBC 8.8  4.0 - 10.5 K/uL    RBC 3.43 (*) 3.87 - 5.11 MIL/uL    Hemoglobin 10.2 (*) 12.0 - 15.0 g/dL    HCT 60.4 (*) 54.0 - 46.0 %    MCV 90.1  78.0 - 100.0 fL    MCH 29.7  26.0 - 34.0 pg    MCHC 33.0  30.0 -  36.0 g/dL    RDW 40.9  81.1 - 91.4 %    Platelets 261  150 - 400 K/uL   HEMOGLOBIN A1C     Status: Abnormal   Collection Time   11/26/11  5:55 AM      Component Value Range Comment   Hemoglobin A1C 7.5 (*) <5.7 %    Mean Plasma Glucose 169 (*) <117 mg/dL   MAGNESIUM     Status: Normal   Collection Time   11/26/11  6:00 AM      Component Value Range Comment   Magnesium 1.7  1.5 - 2.5 mg/dL   GLUCOSE, CAPILLARY     Status: Abnormal   Collection Time   11/26/11  7:37 AM      Component Value Range Comment   Glucose-Capillary 104 (*) 70 - 99 mg/dL    Comment 1 Documented in Chart      Comment 2 Notify RN     GLUCOSE, CAPILLARY     Status: Abnormal   Collection Time   11/26/11 11:10 AM      Component Value Range Comment   Glucose-Capillary 180 (*) 70 - 99 mg/dL    Comment 1 Documented in Chart      Comment 2 Notify RN     PROTIME-INR     Status: Normal   Collection Time   11/26/11 11:41 AM      Component Value Range Comment   Prothrombin Time 14.0  11.6 - 15.2 seconds    INR 1.06  0.00 - 1.49   GLUCOSE, CAPILLARY     Status: Abnormal   Collection Time   11/26/11  4:32 PM      Component Value Range Comment   Glucose-Capillary 175 (*) 70 - 99 mg/dL    Comment 1 Documented in Chart      Comment 2 Notify RN        HPI : The patient is a 67 year old woman with a history  significant for palpitations, type 2 diabetes mellitus, and hypertension, who presented to the emergency department with a chief complaint of her heart racing, nausea, lightheadedness, and diarrhea. In the emergency department, she was noted to be afebrile and tachycardic. Her heart rate was ranging from 100-135 beats per minute. Her EKG revealed atrial fibrillation with a heart rate of 121 beats per minute and nonspecific ST and T-wave abnormalities. She was oxygenating 99% on room air. Her lab data were significant for a WBC of 13.5, sodium of 130, normal troponin I., glucose of 184, and magnesium of 1.4. She was admitted for further evaluation and management.  HOSPITAL COURSE: The patient was given a liter of IV fluids in the emergency department as it appeared that she was volume depleted. She was also noted to have hyponatremia for which the normal saline was ordered. She was going to be started on a Cardizem drip, however, she has an allergy to calcium channel blockers. Therefore, she was given a 5 mg IV dose of metoprolol and one oral dose of metoprolol. Subsequently, she was restarted on carvedilol. IV digoxin was given in the emergency department, followed by oral daily dosing of digoxin. Benazepril and chlorthalidone were withheld due  to low- normal blood pressures and hypovolemia. One gram of magnesium sulfate was given intravenously. Potassium chloride supplementation was started for borderline hypokalemia. Sliding scale NovoLog and Lantus were ordered while metformin was withheld. She was started on symptomatic treatment with as needed IV analgesics and as needed antiemetics. Stool for C.  difficile PCR was ordered. However, the patient's diarrhea completely resolved with symptomatic treatment. For further evaluation, a number of studies were ordered. Her cardiac enzymes were all within normal limits. Her TSH was within normal limits at 2.2 and her free T4 was within normal limits at 1.1. Her 2-D  echocardiogram revealed an ejection fraction of 60-65% and significant valvular abnormalities. Her lipase was within normal limits. Her liver transaminases were within normal limits.  She was started on full dose Lovenox and Coumadin. Cardiologist, Dr. Diona Browner was consulted. He calculated her CHADS2 score to be 4 and CHADS-VASC. Score to be 6. He agreed with anticoagulation. He recommended increasing the carvedilol to 37.5 mg twice a day for better heart rate control. He agreed with continuing digoxin.  The patient complained of left hip pain which she attributed to the bed. An x-ray of her left hip was ordered and it revealed mild spurring. She was treated with analgesics and a topical anti-inflammatory, Voltaren gel. The pain subsided and she was able to ambulate without significant discomfort.  The patient remained hemodynamically stable and afebrile. Her serum sodium and magnesium corrected. Her blood pressure was still on the low normal side, and therefore, she was advised to not take chlorthalidone and benazepril until her blood pressure was reassessed by cardiology or her primary care provider Ms. Jarold Motto. She voiced understanding. Her nausea completely resolved. Her diarrhea completely resolved.  At the time of discharge, the patient's rhythm converted to normal sinus rhythm per telemetry strip reviewed. Just before discharge, S1, S2, was auscultated with a heart rate estimated in the 80s. When she ambulated in the room by the dictating physician, her heart rate increased to the upper 90s lower 100s. She was asymptomatic. She was discharged on 5 mg of Coumadin. As above, she remained on the higher dose of carvedilol and digoxin for rate control. She will followup with the Coumadin clinic next week for further evaluation and management. She was instructed to discuss gastroenterology consultation in the outpatient setting with her primary care provider as her hemoglobin did decrease to 10.2, in  part, because of hemodilution from IV fluids.   Discharge Exam: Blood pressure 132/81, pulse 81, temperature 97.1 F (36.2 C), temperature source Oral, resp. rate 18, height 5\' 3"  (1.6 m), weight 65.318 kg (144 lb), SpO2 98.00%. Lungs: Clear to auscultation bilaterally. Heart: S1, S2, with no murmurs rubs or gallops. Abdomen: Positive bowel sounds, soft, nontender, nondistended. Extremities: No pedal edema. Neurologic: She is alert and oriented x2. Cranial nerves II through XII are intact.   Discharge Orders    Future Appointments: Provider: Department: Dept Phone: Center:   11/30/2011 2:20 PM [facility] [phone] [facility]     Future Orders Please Complete By Expires   Diet - low sodium heart healthy      Diet Carb Modified      Increase activity slowly      Discharge instructions      Comments:   AVOID ASPIRIN AND ASPIRIN LIKE PRODUCTS.  YOU CAN TAKE TYLENOL OR HYDROCODONE AS DIRECTED FOR PAIN. DO NOT TAKE CHLORTHALIDONE AND BENAZEPRIL UNTIL THE CARDIOLOGIST OR YOUR PRIMARY CARE PROVIDER RECHECK YOUR BLOOD PRESSURE. IF YOU HAVE RECTAL BLEEDING OR BLACK TARRY STOOLS, CALL YOUR PRIMARY CARE PROVIDER OR CARDIOLOGIST.      Follow-up Information    Follow up with [facility] on 11/30/2011. (2:20pm  )    Contact information:   [phone] [place] [facility]       Follow up with  Ninfa Linden, NP on 12/02/2011. (AT 1:00 PM)    Contact information:   [address] [place] [address] [phone]          Discharge time: Greater than 35 minutes.  Signed: Gabbi Whetstone 11/26/2011, 5:20 PM

## 2011-11-26 NOTE — Progress Notes (Signed)
Pt discharged home via family; Pt and family given and explained all discharge instructions, carenotes, and prescriptions; pt and family stated understanding and denied questions/concerns; all f/u appointments in place; IV removed without complicaitons; pt stable at time of discharge; talked with patient and daughter extensively about medications and follow-up appointments

## 2011-11-30 ENCOUNTER — Encounter (HOSPITAL_COMMUNITY): Payer: Self-pay | Admitting: *Deleted

## 2011-11-30 ENCOUNTER — Ambulatory Visit (INDEPENDENT_AMBULATORY_CARE_PROVIDER_SITE_OTHER): Payer: Medicare Other | Admitting: Adult Health

## 2011-11-30 ENCOUNTER — Other Ambulatory Visit: Payer: Self-pay | Admitting: Adult Health

## 2011-11-30 ENCOUNTER — Emergency Department (HOSPITAL_COMMUNITY)
Admission: EM | Admit: 2011-11-30 | Discharge: 2011-11-30 | Disposition: A | Discharge status: Home / Self Care | Payer: Medicare Other | Attending: Emergency Medicine | Admitting: Emergency Medicine

## 2011-11-30 ENCOUNTER — Encounter: Payer: Self-pay | Admitting: Adult Health

## 2011-11-30 ENCOUNTER — Ambulatory Visit (INDEPENDENT_AMBULATORY_CARE_PROVIDER_SITE_OTHER): Payer: Medicare Other | Admitting: *Deleted

## 2011-11-30 ENCOUNTER — Encounter (HOSPITAL_COMMUNITY): Payer: Self-pay | Admitting: Radiology

## 2011-11-30 ENCOUNTER — Emergency Department (HOSPITAL_COMMUNITY): Payer: Medicare Other

## 2011-11-30 VITALS — BP 210/80 | HR 73 | Resp 18 | Ht 64.0 in | Wt 149.0 lb

## 2011-11-30 DIAGNOSIS — R160 Hepatomegaly, not elsewhere classified: Secondary | ICD-10-CM | POA: Insufficient documentation

## 2011-11-30 DIAGNOSIS — E119 Type 2 diabetes mellitus without complications: Secondary | ICD-10-CM

## 2011-11-30 DIAGNOSIS — M7918 Myalgia, other site: Secondary | ICD-10-CM

## 2011-11-30 DIAGNOSIS — I4891 Unspecified atrial fibrillation: Secondary | ICD-10-CM

## 2011-11-30 DIAGNOSIS — I10 Essential (primary) hypertension: Secondary | ICD-10-CM

## 2011-11-30 DIAGNOSIS — Z7901 Long term (current) use of anticoagulants: Secondary | ICD-10-CM

## 2011-11-30 DIAGNOSIS — I880 Nonspecific mesenteric lymphadenitis: Secondary | ICD-10-CM | POA: Insufficient documentation

## 2011-11-30 DIAGNOSIS — D259 Leiomyoma of uterus, unspecified: Secondary | ICD-10-CM | POA: Insufficient documentation

## 2011-11-30 DIAGNOSIS — Z79899 Other long term (current) drug therapy: Secondary | ICD-10-CM | POA: Insufficient documentation

## 2011-11-30 DIAGNOSIS — R0789 Other chest pain: Secondary | ICD-10-CM | POA: Insufficient documentation

## 2011-11-30 DIAGNOSIS — M549 Dorsalgia, unspecified: Secondary | ICD-10-CM | POA: Insufficient documentation

## 2011-11-30 DIAGNOSIS — E785 Hyperlipidemia, unspecified: Secondary | ICD-10-CM | POA: Insufficient documentation

## 2011-11-30 DIAGNOSIS — R110 Nausea: Secondary | ICD-10-CM

## 2011-11-30 DIAGNOSIS — R079 Chest pain, unspecified: Secondary | ICD-10-CM | POA: Insufficient documentation

## 2011-11-30 DIAGNOSIS — J189 Pneumonia, unspecified organism: Secondary | ICD-10-CM | POA: Insufficient documentation

## 2011-11-30 DIAGNOSIS — IMO0001 Reserved for inherently not codable concepts without codable children: Secondary | ICD-10-CM | POA: Insufficient documentation

## 2011-11-30 DIAGNOSIS — Z8673 Personal history of transient ischemic attack (TIA), and cerebral infarction without residual deficits: Secondary | ICD-10-CM | POA: Insufficient documentation

## 2011-11-30 LAB — CBC WITH DIFFERENTIAL/PLATELET
Basophils Absolute: 0 10*3/uL (ref 0.0–0.1)
Basophils Relative: 0 % (ref 0–1)
Lymphocytes Relative: 21 % (ref 12–46)
MCHC: 32.6 g/dL (ref 30.0–36.0)
Monocytes Absolute: 0.8 10*3/uL (ref 0.1–1.0)
Neutro Abs: 6.7 10*3/uL (ref 1.7–7.7)
Neutrophils Relative %: 68 % (ref 43–77)
Platelets: 311 10*3/uL (ref 150–400)
RDW: 13.7 % (ref 11.5–15.5)
WBC: 9.9 10*3/uL (ref 4.0–10.5)

## 2011-11-30 LAB — URINE MICROSCOPIC-ADD ON

## 2011-11-30 LAB — COMPREHENSIVE METABOLIC PANEL
ALT: 11 U/L (ref 0–35)
AST: 11 U/L (ref 0–37)
Albumin: 3.4 g/dL — ABNORMAL LOW (ref 3.5–5.2)
CO2: 24 mEq/L (ref 19–32)
Chloride: 99 mEq/L (ref 96–112)
Creatinine, Ser: 0.58 mg/dL (ref 0.50–1.10)
Potassium: 4 mEq/L (ref 3.5–5.1)
Sodium: 138 mEq/L (ref 135–145)
Total Bilirubin: 0.1 mg/dL — ABNORMAL LOW (ref 0.3–1.2)

## 2011-11-30 LAB — URINALYSIS, ROUTINE W REFLEX MICROSCOPIC
Glucose, UA: NEGATIVE mg/dL
Ketones, ur: NEGATIVE mg/dL
Leukocytes, UA: NEGATIVE
pH: 6 (ref 5.0–8.0)

## 2011-11-30 LAB — MAGNESIUM: Magnesium: 1.7 mg/dL (ref 1.5–2.5)

## 2011-11-30 LAB — PROTIME-INR
INR: 1.22 (ref 0.00–1.49)
Prothrombin Time: 15.7 seconds — ABNORMAL HIGH (ref 11.6–15.2)

## 2011-11-30 LAB — POCT INR: INR: 1.3

## 2011-11-30 LAB — PRO B NATRIURETIC PEPTIDE: Pro B Natriuretic peptide (BNP): 460.2 pg/mL — ABNORMAL HIGH (ref 0–125)

## 2011-11-30 MED ORDER — MOXIFLOXACIN HCL IN NACL 400 MG/250ML IV SOLN
400.0000 mg | Freq: Once | INTRAVENOUS | Status: AC
  Administered 2011-11-30: 400 mg via INTRAVENOUS
  Filled 2011-11-30: qty 250

## 2011-11-30 MED ORDER — MOXIFLOXACIN HCL 400 MG PO TABS: 400.0000 mg | ORAL_TABLET | Freq: Every day | ORAL | Status: DC

## 2011-11-30 MED ORDER — IOHEXOL 350 MG/ML SOLN
100.0000 mL | Freq: Once | INTRAVENOUS | Status: AC | PRN
  Administered 2011-11-30: 100 mL via INTRAVENOUS

## 2011-11-30 MED ORDER — CYCLOBENZAPRINE HCL 10 MG PO TABS
10.0000 mg | ORAL_TABLET | Freq: Once | ORAL | Status: AC
  Administered 2011-11-30: 10 mg via ORAL
  Filled 2011-11-30: qty 1

## 2011-11-30 MED ORDER — FLUCONAZOLE 100 MG PO TABS
150.0000 mg | ORAL_TABLET | Freq: Every day | ORAL | Status: DC
  Administered 2011-11-30: 200 mg via ORAL
  Filled 2011-11-30: qty 2

## 2011-11-30 MED ORDER — LEVOFLOXACIN 500 MG PO TABS: 500.0000 mg | ORAL_TABLET | Freq: Every day | ORAL | Status: AC

## 2011-11-30 MED ORDER — SODIUM CHLORIDE 0.9 % IV BOLUS (SEPSIS)
700.0000 mL | Freq: Once | INTRAVENOUS | Status: AC
  Administered 2011-11-30: 17:00:00 via INTRAVENOUS

## 2011-11-30 MED ORDER — HYDROCODONE-ACETAMINOPHEN 5-325 MG PO TABS: 2.0000 | ORAL_TABLET | Freq: Four times a day (QID) | ORAL | Status: AC | PRN

## 2011-11-30 MED ORDER — CYCLOBENZAPRINE HCL 10 MG PO TABS: 10.0000 mg | ORAL_TABLET | Freq: Three times a day (TID) | ORAL | Status: AC | PRN

## 2011-11-30 MED ORDER — HYDROCODONE-ACETAMINOPHEN 5-325 MG PO TABS
2.0000 | ORAL_TABLET | Freq: Once | ORAL | Status: AC
  Administered 2011-11-30: 2 via ORAL
  Filled 2011-11-30: qty 2

## 2011-11-30 NOTE — ED Notes (Signed)
Pt stable at discharge; instructed to finish AB till all gone; Pt instructed not to take tylenol with Norco

## 2011-11-30 NOTE — Assessment & Plan Note (Signed)
She states that her home  BG is running in the 140's. She will need to be better controlled and evaluated by PCP.

## 2011-11-30 NOTE — ED Notes (Signed)
Pt c/o pain in her lower back that "moves across her back". States that sometimes it moves from her lower back to her shoulder and then sometimes to her chest. C/o BM's that are green in color. Denies vomiting or diarrhea. C/o occasional nausea. Alert and oriented x 3. Skin warm and dry. Color pink. Breath sounds clear and equal bilaterally. Cardiac monitor showing NSR. Denies shortness of breath or dizziness.

## 2011-11-30 NOTE — ED Notes (Signed)
Ambulate pt with pulse oximetry in place. Pt walked around the nurses desk 2 times. Pt remained at 100% the whole time. Pt needed no assistance ambulating.

## 2011-11-30 NOTE — Discharge Instructions (Signed)
Take the antibiotic until gone. Take the norco and flexeril for your back pain. Stop the cipro. Recheck with Ninfa Linden if not improving in the next week.

## 2011-11-30 NOTE — ED Provider Notes (Signed)
History   This chart was scribed for Ward Givens, MD by Sofie Rower. The patient was seen in room APA06/APA06 and the patient's care was started at 2:39 PM      CSN: [id number]  Arrival date & time 11/30/11  1408   First MD Initiated Contact with Patient 11/30/11 1423      Chief Complaint  Patient presents with  . Chest Pain    (Consider location/radiation/quality/duration/timing/severity/associated sxs/prior treatment) HPI  Amber Herman is a 66 y.o. female who presents to the Emergency Department complaining of back pain.  Patient relates she was seen 6 days ago and was admitted to the hospital for chest pain, irregular heartbeat, and dehydration. She relates she was having severe dyspnea on exertion. She was treated for a low potassium, magnesium and low sodium. She relates she started having back pain and she had x-rays done that showed a "spur on her back or hip. She was started on digoxin and Coumadin for her heart problems and they increased her dose of carvediol. She was discharged 4 days ago and the following day she called her health care provider and [facility] and was prescribed Cipro for a presumed urinary tract infection. She relates she's been having burning in her chest and she was in the hospital. She states she's nauseated and has decreased appetite. She states her breasts "feel full" she states her back hurts with any kind of movement and feels better when she lays down. She denies any fever or urinary symptoms. She relates when she was discharged they stopped her aspirin, benazepril, and her chloraladine.  PCP is Ninfa Linden in [place], [place].   Past Medical History  Diagnosis Date  . DM (diabetes mellitus), type 2   . Hypertension   . Hyperlipidemia   . GERD (gastroesophageal reflux disease)   . Vitamin d deficiency   . Allergic rhinitis   . Nephrolithiasis   . Transient ischemic attack (TIA)   . Anemia   . Atrial fibrillation with rapid ventricular  response 11/25/2011    Past Surgical History  Procedure Date  . Cholecystectomy     History reviewed. No pertinent family history.  History  Substance Use Topics  . Smoking status: Never Smoker   . Smokeless tobacco: Not on file  . Alcohol Use: No   Pt does not smoke, Pt does not drink. Pt works at [employer]. Pt husband is in a nursing home.   OB History    Grav Para Term Preterm Abortions TAB SAB Ect Mult Living                  Review of Systems  All other systems reviewed and are negative.    10 Systems reviewed and all are negative for acute change except as noted in the HPI.    Allergies  Angiotensin receptor blockers; Calcium channel blockers; Simvastatin; and Sulfa antibiotics  Home Medications   Current Outpatient Rx  Name Route Sig Dispense Refill  . CARVEDILOL 25 MG PO TABS Oral Take 1.5 tablets (37.5 mg total) by mouth 2 (two) times daily with a meal. 9 tablet 2  . DIGOXIN 0.125 MG PO TABS Oral Take 1 tablet (0.125 mg total) by mouth daily. For heart rate control. 30 tablet 2  . HYDROCODONE-ACETAMINOPHEN 5-325 MG PO TABS Oral Take 1 tablet by mouth every 6 (six) hours as needed. For pain 30 tablet 0  . INSULIN DETEMIR 100 UNIT/ML  SOLN Subcutaneous Inject 25 Units into  the skin at bedtime. 10 mL   . METFORMIN HCL 1000 MG PO TABS Oral Take 1,000 mg by mouth 2 (two) times daily with a meal.    . OMEPRAZOLE 40 MG PO CPDR Oral Take 40 mg by mouth daily.    Marland Kitchen PRAVASTATIN SODIUM 40 MG PO TABS Oral Take 80 mg by mouth daily.    Marland Kitchen VITAMIN D (ERGOCALCIFEROL) 50000 UNITS PO CAPS Oral Take 50,000 Units by mouth every 7 (seven) days.    . WARFARIN SODIUM 2.5 MG PO TABS Oral Take 2 tablets (5 mg total) by mouth one time only at 6 PM. STARTING TOMORROW. THE DOSING IS SUBJECT TO CHANGE. 60 tablet 2    BP 194/68  Pulse 77  Temp 98.3 F (36.8 C) (Oral)  Resp 22  Ht 5\' 4"  (1.626 m)  Wt 149 lb (67.586 kg)  BMI 25.58 kg/m2  SpO2 100%  Vital signs normal  except hypertension   Physical Exam  Nursing note and vitals reviewed. Constitutional: She appears well-developed and well-nourished.  HENT:  Head: Normocephalic and atraumatic.  Right Ear: External ear normal.  Left Ear: External ear normal.  Nose: Nose normal.  Mouth/Throat: Oropharynx is clear and moist.  Eyes: Conjunctivae and EOM are normal. Pupils are equal, round, and reactive to light.  Neck: Normal range of motion. Neck supple.  Cardiovascular: Normal rate, regular rhythm and normal heart sounds.   Pulmonary/Chest: Effort normal and breath sounds normal. No respiratory distress. She has no wheezes. She has no rales.  Abdominal: Soft.  Musculoskeletal: Normal range of motion. She exhibits tenderness (Left SIJ, non tender in the lumbar or thoracic spine, midline. ). She exhibits no edema.       Indicates pain across her sacrum then come up her back into her upper chest  Neurological: She is alert.  Skin: Skin is warm and dry.  Psychiatric: Her behavior is normal.       hostile    ED Course  Procedures (including critical care time)   Medications  moxifloxacin (AVELOX) IVPB 400 mg (400 mg Intravenous New Bag/Given 11/30/11 2012)  cyclobenzaprine (FLEXERIL) tablet 10 mg (10 mg Oral Given 11/30/11 1509)  sodium chloride 0.9 % bolus 700 mL (  Intravenous Given 11/30/11 1654)  iohexol (OMNIPAQUE) 350 MG/ML injection 100 mL (100 mL Intravenous Contrast Given 11/30/11 1820)  HYDROcodone-acetaminophen (NORCO) 5-325 MG per tablet 2 tablet (2 tablet Oral Given 11/30/11 2012)     DIAGNOSTIC STUDIES: Oxygen Saturation is 100% on room air, normal by my interpretation.    COORDINATION OF CARE:    2:49PM- EDP at bedside discusses treatment plan concerning evaluation of previous 6/27 hospital visit.   Results for orders placed during the hospital encounter of 11/30/11  CBC WITH DIFFERENTIAL      Component Value Range   WBC 9.9  4.0 - 10.5 K/uL   RBC 3.41 (*) 3.87 - 5.11 MIL/uL    Hemoglobin 10.1 (*) 12.0 - 15.0 g/dL   HCT 19.1 (*) 47.8 - 29.5 %   MCV 90.9  78.0 - 100.0 fL   MCH 29.6  26.0 - 34.0 pg   MCHC 32.6  30.0 - 36.0 g/dL   RDW 62.1  30.8 - 65.7 %   Platelets 311  150 - 400 K/uL   Neutrophils Relative 68  43 - 77 %   Neutro Abs 6.7  1.7 - 7.7 K/uL   Lymphocytes Relative 21  12 - 46 %   Lymphs Abs 2.1  0.7 -  4.0 K/uL   Monocytes Relative 8  3 - 12 %   Monocytes Absolute 0.8  0.1 - 1.0 K/uL   Eosinophils Relative 3  0 - 5 %   Eosinophils Absolute 0.3  0.0 - 0.7 K/uL   Basophils Relative 0  0 - 1 %   Basophils Absolute 0.0  0.0 - 0.1 K/uL  COMPREHENSIVE METABOLIC PANEL      Component Value Range   Sodium 138  135 - 145 mEq/L   Potassium 4.0  3.5 - 5.1 mEq/L   Chloride 99  96 - 112 mEq/L   CO2 24  19 - 32 mEq/L   Glucose, Bld 161 (*) 70 - 99 mg/dL   BUN 9  6 - 23 mg/dL   Creatinine, Ser 4.09  0.50 - 1.10 mg/dL   Calcium 9.7  8.4 - 81.1 mg/dL   Total Protein 7.1  6.0 - 8.3 g/dL   Albumin 3.4 (*) 3.5 - 5.2 g/dL   AST 11  0 - 37 U/L   ALT 11  0 - 35 U/L   Alkaline Phosphatase 53  39 - 117 U/L   Total Bilirubin 0.1 (*) 0.3 - 1.2 mg/dL   GFR calc non Af Amer >90  >90 mL/min   GFR calc Af Amer >90  >90 mL/min  TROPONIN I      Component Value Range   Troponin I <0.30  <0.30 ng/mL  URINALYSIS, ROUTINE W REFLEX MICROSCOPIC      Component Value Range   Color, Urine YELLOW  YELLOW   APPearance CLEAR  CLEAR   Specific Gravity, Urine >1.030 (*) 1.005 - 1.030   pH 6.0  5.0 - 8.0   Glucose, UA NEGATIVE  NEGATIVE mg/dL   Hgb urine dipstick SMALL (*) NEGATIVE   Bilirubin Urine NEGATIVE  NEGATIVE   Ketones, ur NEGATIVE  NEGATIVE mg/dL   Protein, ur TRACE (*) NEGATIVE mg/dL   Urobilinogen, UA 0.2  0.0 - 1.0 mg/dL   Nitrite NEGATIVE  NEGATIVE   Leukocytes, UA NEGATIVE  NEGATIVE  PRO B NATRIURETIC PEPTIDE      Component Value Range   Pro B Natriuretic peptide (BNP) 460.2 (*) 0 - 125 pg/mL  D-DIMER, QUANTITATIVE      Component Value Range   D-Dimer, Quant  0.53 (*) 0.00 - 0.48 ug/mL-FEU  APTT      Component Value Range   aPTT 39 (*) 24 - 37 seconds  PROTIME-INR      Component Value Range   Prothrombin Time 15.7 (*) 11.6 - 15.2 seconds   INR 1.22  0.00 - 1.49  DIGOXIN LEVEL      Component Value Range   Digoxin Level 0.6 (*) 0.8 - 2.0 ng/mL  MAGNESIUM      Component Value Range   Magnesium 1.7  1.5 - 2.5 mg/dL  URINE MICROSCOPIC-ADD ON      Component Value Range   Squamous Epithelial / LPF FEW (*) RARE   WBC, UA 3-6  <3 WBC/hpf   RBC / HPF 3-6  <3 RBC/hpf    Laboratory interpretation all normal except mild anemia   Dg Chest 2 View  11/30/2011  *RADIOLOGY REPORT*  Clinical Data: Shortness of breath  CHEST - 2 VIEW  Comparison: 11/25/2011  Findings: Heart size is normal.  No pleural effusion or edema.  No airspace consolidation identified.  Plate-like atelectasis versus scarring noted in the left midlung and left base.  No airspace consolidation identified.  IMPRESSION:  1.  Left base atelectasis versus scarring.  Original Report Authenticated By: Rosealee Albee, M.D.   Dg Lumbar Spine Complete  11/30/2011  *RADIOLOGY REPORT*  Clinical Data: Pain  LUMBAR SPINE - COMPLETE 4+ VIEW  Comparison: None.  Findings: The lumbar vertebrae are in normal alignment.  There is degenerative disc disease at the L5 S1 level with loss of disc space, sclerosis, and spurring.  No compression deformity is seen. The SI joints appear normal.  IMPRESSION: Degenerative disc disease at L5-S1.  Normal alignment.  Original Report Authenticated By: Juline Patch, M.D.   Dg Pelvis 1-2 Views  11/30/2011  *RADIOLOGY REPORT*  Clinical Data: Chest pain  PELVIS - 1-2 VIEW  Comparison: Pelvis film of 11/25/2011  Findings: There is no change in the previously described mild acetabular spurring superiorly.  No acute abnormality is seen.  The pelvic rami are intact.  IMPRESSION: No acute abnormality.  No change in superior acetabular spurring.  Original Report Authenticated By:  Juline Patch, M.D.   Dg Hip Complete Left  11/25/2011  *RADIOLOGY REPORT*  Clinical Data: History of left hip pain.  No history of injury.  LEFT HIP - COMPLETE 2+ VIEW  Comparison: None.  Findings: SI joints appear intact.  Hip joint spaces are preserved. No fracture, dislocation, bony destruction, or calcific bursitis is evident.  There is very minimal acetabular marginal osteophyte spurring.  IMPRESSION: Very minimal spurring of the acetabulum.  No fracture or bony destruction.  No calcific bursitis.  Original Report Authenticated By: Crawford Givens, M.D.   Ct Angio Chest W/cm &/or Wo Cm  11/30/2011  *RADIOLOGY REPORT*  Clinical Data: Chest pain.  Elevated D-dimer.  CT ANGIOGRAPHY CHEST  Technique:  Multidetector CT imaging of the chest using the standard protocol during bolus administration of intravenous contrast. Multiplanar reconstructed images including MIPs were obtained and reviewed to evaluate the vascular anatomy.  Contrast: OMNIPAQUE IOHEXOL 350 MG/ML SOLN  Comparison: Chest x-ray dated 11/30/2011  Findings: There are no pulmonary emboli.  There are no consolidative infiltrates. Tiny bilateral effusions.  Slight cardiomegaly.  There is a very faint area of infiltrate in the posterior aspect of the right upper lobe as well as a similar area of minimal infiltrate at both lung bases.  There is slight atelectasis in the lingula.  No hilar or mediastinal adenopathy of significance.  Tiny pericardial effusion.  IMPRESSION:  1.  No pulmonary emboli. 2.  Cardiomegaly. 3.  Tiny areas of minimal infiltrate in the right upper lobe and at both lung bases posteriorly.  Original Report Authenticated By: Gwynn Burly, M.D.   Ct Abdomen Pelvis W Contrast  11/30/2011  *RADIOLOGY REPORT*  Clinical Data: Flank pain.  Chest pain.  Low back pain.  CT ABDOMEN AND PELVIS WITH CONTRAST  Technique:  Multidetector CT imaging of the abdomen and pelvis was performed following the standard protocol during bolus  administration of intravenous contrast.  Contrast: OMNIPAQUE IOHEXOL 350 MG/ML SOLN  Comparison: None.  Findings: The gallbladder has been removed.  The patient has hepatomegaly with no focal lesions.  No dilated bile ducts.  Spleen, pancreas, adrenal glands, and kidneys are normal. No renal or ureteral calculi.  There are scattered diverticula in the left side of the colon.  There are multiple fibroids in the uterus.  Ovaries are normal.  No free air or free fluid.  There is slight mesenteric adenopathy.  These are tiny lymph nodes suggesting mesenteric adenitis.  Does the patient have any symptoms of gastroenteritis?  No significant osseous abnormality.  The appendix is not visible.  IMPRESSION:  1.  Multiple tiny lymph nodes in the mesentery suggesting mesenteric adenitis. 2.  Hepatomegaly. 3.  Multiple fibroids in the uterus. 4.  Otherwise, benign-appearing abdomen and pelvis.  Original Report Authenticated By: Gwynn Burly, M.D.   Dg Chest Port 1 View  11/25/2011  *RADIOLOGY REPORT*  Clinical Data: Shortness of breath, fatigue, nausea and vomiting.  PORTABLE CHEST - 1 VIEW  Comparison: None.  Findings: Trachea is midline.  Heart size within normal limits. Lungs are clear.  No pleural fluid.  IMPRESSION: No acute findings.  Original Report Authenticated By: Reyes Ivan, M.D.     Date: 11/30/2011  Rate: 70  Rhythm: normal sinus rhythm  QRS Axis: normal  Intervals: normal  ST/T Wave abnormalities: nonspecific ST changes  Conduction Disutrbances:none  Narrative Interpretation:   Old EKG Reviewed: none available    1. Musculoskeletal pain   2. Community acquired pneumonia    New Prescriptions   CYCLOBENZAPRINE (FLEXERIL) 10 MG TABLET    Take 1 tablet (10 mg total) by mouth 3 (three) times daily as needed for muscle spasms.   HYDROCODONE-ACETAMINOPHEN (NORCO) 5-325 MG PER TABLET    Take 2 tablets by mouth every 6 (six) hours as needed for pain.   LEVOFLOXACIN (LEVAQUIN) 500 MG  TABLET    Take 1 tablet (500 mg total) by mouth daily.   Plan discharge  Devoria Albe, MD, FACEP    MDM   I personally performed the services described in this documentation, which was scribed in my presence. The recorded information has been reviewed and considered.  Devoria Albe, MD, Armando Gang    Ward Givens, MD 11/30/11 2110

## 2011-11-30 NOTE — Assessment & Plan Note (Signed)
Chest discomfort may be related to elevated BP. I have advised that she start back on antihypertensive, however she refused. She wishes to be put back into the hospital and refuses OP treatment. I have advised that she will be to be seen in ER if she feels that badly. She verbalizes understanding. We have taken her via W/C to triage. I have spoken with ER about this patient and they are expecting her.

## 2011-11-30 NOTE — Progress Notes (Addendum)
HPI: Amber Herman is a 66 y/o patient seen by Dr. Diona Browner in consultation on 6/27 for newly diagnosed atrial fibrillation with possible history of PAF over time based on her history. This was noted in the setting of electrolyte abnormalities including hyponatremia, hypomagnesemia, and hypokalemia. Her antihypertensive regimen was modified by Hospitalist service, removal of benazepril and chlorthalidone given concerns about hypovolemia, and she was placed on Coreg and ultimately digoxin for treatment of rapid atrial fibrillation. Anticoagulation was recommended in light of CHADS-VASC score of 6, and Coumadin was initiated with plan to follow up in our office today.  Of note, at time of consultation she remained in atrial fibrillation with elevated ventricular response, however per discharge summary she had reverted to sinus rhythm. She was discharged by Dr. Sherrie Mustache on 6/27.  She comes today hypertensive, nauseated and "feeling worse than I did before I was in the hospital." She complains of nausea, chest tightness around her back, and overall feeling sick.   Allergies  Allergen Reactions  . Angiotensin Receptor Blockers   . Calcium Channel Blockers   . Simvastatin   . Sulfa Antibiotics Itching and Rash    Current Outpatient Prescriptions  Medication Sig Dispense Refill  . carvedilol (COREG) 25 MG tablet Take 1.5 tablets (37.5 mg total) by mouth 2 (two) times daily with a meal.  9 tablet  2  . digoxin (LANOXIN) 0.125 MG tablet Take 1 tablet (0.125 mg total) by mouth daily. For heart rate control.  30 tablet  2  . HYDROcodone-acetaminophen (NORCO) 5-325 MG per tablet Take 1 tablet by mouth every 6 (six) hours as needed. For pain  30 tablet  0  . insulin detemir (LEVEMIR) 100 UNIT/ML injection Inject 25 Units into the skin at bedtime.  10 mL    . metFORMIN (GLUCOPHAGE) 1000 MG tablet Take 1,000 mg by mouth 2 (two) times daily with a meal.      . omeprazole (PRILOSEC) 40 MG capsule Take 40 mg by  mouth daily.      . pravastatin (PRAVACHOL) 40 MG tablet Take 80 mg by mouth daily.      . Vitamin D, Ergocalciferol, (DRISDOL) 50000 UNITS CAPS Take 50,000 Units by mouth every 7 (seven) days.      Marland Kitchen warfarin (COUMADIN) 2.5 MG tablet Take 2 tablets (5 mg total) by mouth one time only at 6 PM. STARTING TOMORROW. THE DOSING IS SUBJECT TO CHANGE.  60 tablet  2    Past Medical History  Diagnosis Date  . DM (diabetes mellitus), type 2   . Hypertension   . Hyperlipidemia   . GERD (gastroesophageal reflux disease)   . Vitamin d deficiency   . Allergic rhinitis   . Nephrolithiasis   . Transient ischemic attack (TIA)   . Anemia   . Atrial fibrillation with rapid ventricular response 11/25/2011    Past Surgical History  Procedure Date  . Cholecystectomy     [id number] of systems complete and found to be negative unless listed above  PHYSICAL EXAM BP 210/80  Pulse 73  Resp 18  Ht 5\' 4"  (1.626 m)  Wt 149 lb (67.586 kg)  BMI 25.58 kg/m2  General: Well developed, well nourished, in no acute distress Head: Eyes PERRLA, No xanthomas.   Normal cephalic and atramatic  Lungs: Clear bilaterally to auscultation and percussion. Heart: HRRR S1 S2, without MRG.  Pulses are 2+ & equal.            No carotid bruit. No JVD.  No abdominal bruits. No femoral bruits. Abdomen: Bowel sounds are positive, abdomen soft and non-tender without masses or                  Hernia's noted. Msk:  Back normal, normal gait. Normal strength and tone for age. Extremities: No clubbing, cyanosis or edema.  DP +1 Neuro: Alert and oriented X 3. Psych:  Good affect, responds angrily and cannot be consoled.  ASSESSMENT AND PLAN

## 2011-11-30 NOTE — Assessment & Plan Note (Signed)
This may be related to cipro or use of digoxin. Dig level should be drawn with ER labs.

## 2011-11-30 NOTE — ED Notes (Signed)
Pt given diet Sprite to drink.

## 2011-11-30 NOTE — ED Notes (Signed)
Chest and back pain, d/c fromAnnie Herman 6/27 Says she was having the same sx when she was d/c .sent here from [facility] clinic

## 2012-03-23 ENCOUNTER — Encounter: Payer: Self-pay | Admitting: *Deleted

## 2013-01-25 ENCOUNTER — Encounter: Payer: Self-pay | Admitting: Pharmacist

## 2014-01-10 ENCOUNTER — Ambulatory Visit: Payer: Self-pay | Admitting: *Deleted

## 2014-01-10 DIAGNOSIS — I4891 Unspecified atrial fibrillation: Secondary | ICD-10-CM

## 2014-01-10 DIAGNOSIS — Z7901 Long term (current) use of anticoagulants: Secondary | ICD-10-CM

## 2014-06-29 ENCOUNTER — Observation Stay (HOSPITAL_COMMUNITY)
Admission: EM | Admit: 2014-06-29 | Discharge: 2014-06-30 | Disposition: A | Discharge status: Home / Self Care | Payer: Medicare Other | Attending: Internal Medicine | Admitting: Internal Medicine

## 2014-06-29 ENCOUNTER — Emergency Department (HOSPITAL_COMMUNITY): Payer: Medicare Other

## 2014-06-29 ENCOUNTER — Encounter (HOSPITAL_COMMUNITY): Payer: Self-pay | Admitting: *Deleted

## 2014-06-29 DIAGNOSIS — D649 Anemia, unspecified: Secondary | ICD-10-CM | POA: Diagnosis present

## 2014-06-29 DIAGNOSIS — R4701 Aphasia: Secondary | ICD-10-CM | POA: Diagnosis present

## 2014-06-29 DIAGNOSIS — E559 Vitamin D deficiency, unspecified: Secondary | ICD-10-CM | POA: Insufficient documentation

## 2014-06-29 DIAGNOSIS — Z87442 Personal history of urinary calculi: Secondary | ICD-10-CM | POA: Insufficient documentation

## 2014-06-29 DIAGNOSIS — Z8709 Personal history of other diseases of the respiratory system: Secondary | ICD-10-CM | POA: Insufficient documentation

## 2014-06-29 DIAGNOSIS — G459 Transient cerebral ischemic attack, unspecified: Secondary | ICD-10-CM | POA: Diagnosis not present

## 2014-06-29 DIAGNOSIS — E119 Type 2 diabetes mellitus without complications: Secondary | ICD-10-CM | POA: Diagnosis not present

## 2014-06-29 DIAGNOSIS — I4891 Unspecified atrial fibrillation: Secondary | ICD-10-CM | POA: Insufficient documentation

## 2014-06-29 DIAGNOSIS — E785 Hyperlipidemia, unspecified: Secondary | ICD-10-CM | POA: Diagnosis not present

## 2014-06-29 DIAGNOSIS — I10 Essential (primary) hypertension: Secondary | ICD-10-CM | POA: Diagnosis not present

## 2014-06-29 DIAGNOSIS — K219 Gastro-esophageal reflux disease without esophagitis: Secondary | ICD-10-CM | POA: Insufficient documentation

## 2014-06-29 LAB — RAPID URINE DRUG SCREEN, HOSP PERFORMED
Amphetamines: NOT DETECTED
Barbiturates: NOT DETECTED
Benzodiazepines: NOT DETECTED
Cocaine: NOT DETECTED
Opiates: NOT DETECTED
Tetrahydrocannabinol: NOT DETECTED

## 2014-06-29 LAB — I-STAT TROPONIN, ED: Troponin i, poc: 0 ng/mL (ref 0.00–0.08)

## 2014-06-29 LAB — DIFFERENTIAL
Basophils Absolute: 0 10*3/uL (ref 0.0–0.1)
Basophils Relative: 0 % (ref 0–1)
EOS PCT: 2 % (ref 0–5)
Eosinophils Absolute: 0.2 10*3/uL (ref 0.0–0.7)
Lymphocytes Relative: 32 % (ref 12–46)
Lymphs Abs: 2.7 10*3/uL (ref 0.7–4.0)
Monocytes Absolute: 0.7 10*3/uL (ref 0.1–1.0)
Monocytes Relative: 8 % (ref 3–12)
Neutro Abs: 4.9 10*3/uL (ref 1.7–7.7)
Neutrophils Relative %: 58 % (ref 43–77)

## 2014-06-29 LAB — PROTIME-INR
INR: 0.92 (ref 0.00–1.49)
PROTHROMBIN TIME: 12.5 s (ref 11.6–15.2)

## 2014-06-29 LAB — URINALYSIS, ROUTINE W REFLEX MICROSCOPIC
BILIRUBIN URINE: NEGATIVE
Glucose, UA: NEGATIVE mg/dL
Ketones, ur: NEGATIVE mg/dL
NITRITE: NEGATIVE
PH: 6 (ref 5.0–8.0)
PROTEIN: NEGATIVE mg/dL
Specific Gravity, Urine: 1.01 (ref 1.005–1.030)
UROBILINOGEN UA: 0.2 mg/dL (ref 0.0–1.0)

## 2014-06-29 LAB — COMPREHENSIVE METABOLIC PANEL
ALT: 16 U/L (ref 0–35)
AST: 17 U/L (ref 0–37)
Albumin: 4.2 g/dL (ref 3.5–5.2)
Alkaline Phosphatase: 44 U/L (ref 39–117)
Anion gap: 7 (ref 5–15)
BUN: 19 mg/dL (ref 6–23)
CALCIUM: 9 mg/dL (ref 8.4–10.5)
CO2: 28 mmol/L (ref 19–32)
CREATININE: 0.7 mg/dL (ref 0.50–1.10)
Chloride: 100 mmol/L (ref 96–112)
GFR calc Af Amer: >90 mL/min (ref >90)
GFR calc non Af Amer: 87 mL/min — ABNORMAL LOW (ref >90)
Glucose, Bld: 107 mg/dL — ABNORMAL HIGH (ref 70–99)
Potassium: 3.1 mmol/L — ABNORMAL LOW (ref 3.5–5.1)
SODIUM: 135 mmol/L (ref 135–145)
TOTAL PROTEIN: 7.7 g/dL (ref 6.0–8.3)
Total Bilirubin: 0.4 mg/dL (ref 0.3–1.2)

## 2014-06-29 LAB — I-STAT CHEM 8, ED
BUN: 18 mg/dL (ref 6–23)
CREATININE: 0.7 mg/dL (ref 0.50–1.10)
Calcium, Ion: 1.2 mmol/L (ref 1.13–1.30)
Chloride: 97 mmol/L (ref 96–112)
Glucose, Bld: 102 mg/dL — ABNORMAL HIGH (ref 70–99)
HCT: 36 % (ref 36.0–46.0)
Hemoglobin: 12.2 g/dL (ref 12.0–15.0)
POTASSIUM: 3.2 mmol/L — AB (ref 3.5–5.1)
Sodium: 137 mmol/L (ref 135–145)
TCO2: 25 mmol/L (ref 0–100)

## 2014-06-29 LAB — CBC
HEMATOCRIT: 33 % — AB (ref 36.0–46.0)
Hemoglobin: 10.7 g/dL — ABNORMAL LOW (ref 12.0–15.0)
MCH: 29.7 pg (ref 26.0–34.0)
MCHC: 32.4 g/dL (ref 30.0–36.0)
MCV: 91.7 fL (ref 78.0–100.0)
Platelets: 297 10*3/uL (ref 150–400)
RBC: 3.6 MIL/uL — AB (ref 3.87–5.11)
RDW: 13 % (ref 11.5–15.5)
WBC: 8.5 10*3/uL (ref 4.0–10.5)

## 2014-06-29 LAB — RETICULOCYTES
RBC.: 3.61 MIL/uL — ABNORMAL LOW (ref 3.87–5.11)
Retic Count, Absolute: 43.3 10*3/uL (ref 19.0–186.0)
Retic Ct Pct: 1.2 % (ref 0.4–3.1)

## 2014-06-29 LAB — APTT: aPTT: 30 seconds (ref 24–37)

## 2014-06-29 LAB — URINE MICROSCOPIC-ADD ON

## 2014-06-29 LAB — GLUCOSE, CAPILLARY: GLUCOSE-CAPILLARY: 233 mg/dL — AB (ref 70–99)

## 2014-06-29 LAB — ETHANOL

## 2014-06-29 MED ORDER — INSULIN DETEMIR 100 UNIT/ML ~~LOC~~ SOLN
28.0000 [IU] | Freq: Every day | SUBCUTANEOUS | Status: DC
  Filled 2014-06-29 (×3): qty 0.28

## 2014-06-29 MED ORDER — VITAMIN D (ERGOCALCIFEROL) 1.25 MG (50000 UNIT) PO CAPS: 50000.0000 [IU] | ORAL_CAPSULE | ORAL | Status: DC

## 2014-06-29 MED ORDER — CHLORTHALIDONE 25 MG PO TABS
25.0000 mg | ORAL_TABLET | Freq: Every day | ORAL | Status: DC
  Administered 2014-06-30: 25 mg via ORAL
  Filled 2014-06-29 (×3): qty 1

## 2014-06-29 MED ORDER — FLUOCINONIDE 0.05 % EX SOLN
1.0000 "application " | Freq: Every day | CUTANEOUS | Status: DC
  Filled 2014-06-29: qty 60

## 2014-06-29 MED ORDER — BENAZEPRIL HCL 10 MG PO TABS
20.0000 mg | ORAL_TABLET | Freq: Every day | ORAL | Status: DC
  Administered 2014-06-30: 20 mg via ORAL
  Filled 2014-06-29: qty 1
  Filled 2014-06-29: qty 2
  Filled 2014-06-29: qty 1

## 2014-06-29 MED ORDER — INSULIN ASPART 100 UNIT/ML ~~LOC~~ SOLN: 0.0000 [IU] | Freq: Every day | SUBCUTANEOUS | Status: DC

## 2014-06-29 MED ORDER — CARVEDILOL 12.5 MG PO TABS
25.0000 mg | ORAL_TABLET | Freq: Two times a day (BID) | ORAL | Status: DC
  Administered 2014-06-30: 25 mg via ORAL
  Filled 2014-06-29: qty 1
  Filled 2014-06-29: qty 2
  Filled 2014-06-29 (×2): qty 1

## 2014-06-29 MED ORDER — INSULIN ASPART 100 UNIT/ML ~~LOC~~ SOLN
0.0000 [IU] | Freq: Three times a day (TID) | SUBCUTANEOUS | Status: DC
  Administered 2014-06-30: 3 [IU] via SUBCUTANEOUS

## 2014-06-29 MED ORDER — STROKE: EARLY STAGES OF RECOVERY BOOK
Freq: Once | Status: DC
  Filled 2014-06-29: qty 1

## 2014-06-29 MED ORDER — METFORMIN HCL 500 MG PO TABS
1000.0000 mg | ORAL_TABLET | Freq: Two times a day (BID) | ORAL | Status: DC
  Administered 2014-06-30: 1000 mg via ORAL
  Filled 2014-06-29: qty 2

## 2014-06-29 MED ORDER — ASPIRIN 325 MG PO TABS
325.0000 mg | ORAL_TABLET | Freq: Every day | ORAL | Status: DC
  Administered 2014-06-30: 325 mg via ORAL
  Filled 2014-06-29: qty 1

## 2014-06-29 MED ORDER — PRAVASTATIN SODIUM 40 MG PO TABS
40.0000 mg | ORAL_TABLET | Freq: Every day | ORAL | Status: DC
  Filled 2014-06-29 (×3): qty 1

## 2014-06-29 MED ORDER — HEPARIN SODIUM (PORCINE) 5000 UNIT/ML IJ SOLN
5000.0000 [IU] | Freq: Three times a day (TID) | INTRAMUSCULAR | Status: DC
  Administered 2014-06-30: 5000 [IU] via SUBCUTANEOUS
  Filled 2014-06-29 (×3): qty 1

## 2014-06-29 NOTE — ED Notes (Signed)
Patient was at work when she began having difficulty getting words out.  States is lasted 30 minutes or so.  No deficit noted at present.  Denies n/v, weakness, vision changes or dizziness.  Reports L sided headache 2/10.

## 2014-06-29 NOTE — ED Provider Notes (Signed)
CSN: [id number]     Arrival date & time 06/29/14  1437 History   First MD Initiated Contact with Patient 06/29/14 1511     Chief Complaint  Patient presents with  . Aphasia     HPI Pt was seen at 1525. Per pt and her family, c/o sudden onset and resolution of one episode of aphasia that occurred today approximately 1200. 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 States she "just couldn't say the words I was thinking." Family states pt was "talking garbled" and "gibberish." States her symptoms lasted for approximately 30 minutes before resolving completely. Pt states she has hx of similar symptoms, but did not seek medical care; "assuming I was having mini-strokes." Denies return of symptoms. Denies visual changes, no focal motor weakness, no tingling/numbness in extremities, no ataxia, no facial droop. Denies CP/SOB, no abd pain, no N/V/D, no back pain. Pt endorses she has had a left sided "dull headache" for the past several days. Pt states her scalp "hurts" when she touches it." States she was evaluated by her PMD for same and "told it was a muscle pain." Denies headache was sudden or maximal at onset or at any time. Denies change in headache. Pt has not taken any meds for pain.     Past Medical History  Diagnosis Date  . DM (diabetes mellitus), type 2   . Hypertension   . Hyperlipidemia   . GERD (gastroesophageal reflux disease)   . Vitamin D deficiency   . Allergic rhinitis   . Nephrolithiasis   . Transient ischemic attack (TIA)   . Anemia   . Atrial fibrillation with rapid ventricular response 11/25/2011   Past Surgical History  Procedure Laterality Date  . Cholecystectomy      History  Substance Use Topics  . Smoking status: Never Smoker   . Smokeless tobacco: Not on file  . Alcohol Use: No    Review of Systems ROS: Statement: All systems negative except as marked or noted in the HPI; Constitutional: Negative for fever and chills. ; ; Eyes: Negative for  eye pain, redness and discharge. ; ; ENMT: Negative for ear pain, hoarseness, nasal congestion, sinus pressure and sore throat. ; ; Cardiovascular: Negative for chest pain, palpitations, diaphoresis, dyspnea and peripheral edema. ; ; Respiratory: Negative for cough, wheezing and stridor. ; ; Gastrointestinal: Negative for nausea, vomiting, diarrhea, abdominal pain, blood in stool, hematemesis, jaundice and rectal bleeding. . ; ; Genitourinary: Negative for dysuria, flank pain and hematuria. ; ; Musculoskeletal: Negative for back pain and neck pain. Negative for swelling and trauma.; ; Skin: Negative for pruritus, rash, abrasions, blisters, bruising and skin lesion.; ; Neuro: +aphasia. Negative for lightheadedness and neck stiffness. Negative for weakness, altered level of consciousness , altered mental status, extremity weakness, paresthesias, involuntary movement, seizure and syncope.      Allergies  Angiotensin receptor blockers; Calcium channel blockers; Simvastatin; and Sulfa antibiotics  Home Medications   Prior to Admission medications   Medication Sig Start Date End Date Taking? Authorizing Provider  benazepril (LOTENSIN) 20 MG tablet Take 20 mg by mouth daily. 06/27/14  Yes Historical Provider, MD  carvedilol (COREG) 25 MG tablet Take 1.5 tablets (37.5 mg total) by mouth 2 (two) times daily with a meal. Patient taking differently: Take 25 mg by mouth 2 (two) times daily with a meal.  11/26/11  Yes Rexene Alberts, MD  chlorthalidone (HYGROTON) 25 MG tablet Take 25 mg by mouth daily. 06/27/14  Yes Historical Provider, MD  fluocinonide (LIDEX) 0.05 % external solution Apply 1 application topically at bedtime.  06/20/14  Yes Historical Provider, MD  insulin detemir (LEVEMIR) 100 UNIT/ML injection Inject 28 Units into the skin at bedtime.  11/26/11  Yes Rexene Alberts, MD  metFORMIN (GLUCOPHAGE) 1000 MG tablet Take 1,000 mg by mouth 2 (two) times daily with a meal.   Yes Historical Provider, MD   pravastatin (PRAVACHOL) 40 MG tablet Take 40 mg by mouth at bedtime.    Yes Historical Provider, MD  Vitamin D, Ergocalciferol, (DRISDOL) 50000 UNITS CAPS Take 50,000 Units by mouth every Monday, Wednesday, and Friday.    Yes Historical Provider, MD   BP 158/50 mmHg  Pulse 60  Temp(Src) 98.2 F (36.8 C) (Oral)  Resp 16  Ht 5\' 4"  (1.626 m)  Wt 145 lb (65.772 kg)  BMI 24.88 kg/m2  SpO2 100% Physical Exam  1530: Physical examination:  Nursing notes reviewed; Vital signs and O2 SAT reviewed;  Constitutional: Well developed, Well nourished, Well hydrated, In no acute distress; Head:  Normocephalic, atraumatic; Eyes: EOMI, PERRL, No scleral icterus; ENMT: Mouth and pharynx normal, Mucous membranes moist; Neck: Supple, Full range of motion, No lymphadenopathy; Cardiovascular: Regular rate and rhythm, No gallop; Respiratory: Breath sounds clear & equal bilaterally, No rales, rhonchi, wheezes.  Speaking full sentences with ease, Normal respiratory effort/excursion; Chest: Nontender, Movement normal; Abdomen: Soft, Nontender, Nondistended, Normal bowel sounds; Genitourinary: No CVA tenderness; Extremities: Pulses normal, No tenderness, No edema, No calf edema or asymmetry.; Neuro: AA&Ox3, Major CN grossly intact. Speech clear.  No facial droop.  No nystagmus. No gross visual field deficits. Grips equal. Strength 5/5 equal bilat UE's and LE's.  DTR 2/4 equal bilat UE's and LE's.  No gross sensory deficits.  Normal cerebellar testing bilat UE's (finger-nose) and LE's (heel-shin)..; Skin: Color normal, Warm, Dry.   ED Course  Procedures     EKG Interpretation   Date/Time:  Friday June 29 2014 14:47:41 EST Ventricular Rate:  64 PR Interval:  144 QRS Duration: 83 QT Interval:  399 QTC Calculation: 412 R Axis:   41 Text Interpretation:  Sinus rhythm Borderline repolarization abnormality  Nonspecific ST and T wave abnormality Baseline wander Artifact When  compared with ECG of 11/25/2011 Normal  sinus rhythm has replaced Atrial  fibrillation Confirmed by [facility]  MD, Nunzio Cory [phone]) on 06/29/2014  3:55:54 PM      MDM  MDM Reviewed: previous chart, nursing note and vitals Reviewed previous: labs and ECG Interpretation: labs, ECG, CT scan and MRI     Results for orders placed or performed during the hospital encounter of 06/29/14  Protime-INR  Result Value Ref Range   Prothrombin Time 12.5 11.6 - 15.2 seconds   INR 0.92 0.00 - 1.49  APTT  Result Value Ref Range   aPTT 30 24 - 37 seconds  CBC  Result Value Ref Range   WBC 8.5 4.0 - 10.5 K/uL   RBC 3.60 (L) 3.87 - 5.11 MIL/uL   Hemoglobin 10.7 (L) 12.0 - 15.0 g/dL   HCT 33.0 (L) 36.0 - 46.0 %   MCV 91.7 78.0 - 100.0 fL   MCH 29.7 26.0 - 34.0 pg   MCHC 32.4 30.0 - 36.0 g/dL   RDW 13.0 11.5 - 15.5 %   Platelets 297 150 - 400 K/uL  Differential  Result Value Ref Range   Neutrophils Relative % 58 43 - 77 %   Neutro Abs 4.9 1.7 - 7.7 K/uL  Lymphocytes Relative 32 12 - 46 %   Lymphs Abs 2.7 0.7 - 4.0 K/uL   Monocytes Relative 8 3 - 12 %   Monocytes Absolute 0.7 0.1 - 1.0 K/uL   Eosinophils Relative 2 0 - 5 %   Eosinophils Absolute 0.2 0.0 - 0.7 K/uL   Basophils Relative 0 0 - 1 %   Basophils Absolute 0.0 0.0 - 0.1 K/uL  Comprehensive metabolic panel  Result Value Ref Range   Sodium 135 135 - 145 mmol/L   Potassium 3.1 (L) 3.5 - 5.1 mmol/L   Chloride 100 96 - 112 mmol/L   CO2 28 19 - 32 mmol/L   Glucose, Bld 107 (H) 70 - 99 mg/dL   BUN 19 6 - 23 mg/dL   Creatinine, Ser 0.70 0.50 - 1.10 mg/dL   Calcium 9.0 8.4 - 10.5 mg/dL   Total Protein 7.7 6.0 - 8.3 g/dL   Albumin 4.2 3.5 - 5.2 g/dL   AST 17 0 - 37 U/L   ALT 16 0 - 35 U/L   Alkaline Phosphatase 44 39 - 117 U/L   Total Bilirubin 0.4 0.3 - 1.2 mg/dL   GFR calc non Af Amer 87 (L) >90 mL/min   GFR calc Af Amer >90 >90 mL/min   Anion gap 7 5 - 15  I-Stat Chem 8, ED  Result Value Ref Range   Sodium 137 135 - 145 mmol/L   Potassium 3.2 (L) 3.5 - 5.1  mmol/L   Chloride 97 96 - 112 mmol/L   BUN 18 6 - 23 mg/dL   Creatinine, Ser 0.70 0.50 - 1.10 mg/dL   Glucose, Bld 102 (H) 70 - 99 mg/dL   Calcium, Ion 1.20 1.13 - 1.30 mmol/L   TCO2 25 0 - 100 mmol/L   Hemoglobin 12.2 12.0 - 15.0 g/dL   HCT 36.0 36.0 - 46.0 %  I-Stat Troponin, ED (not at [facility])  Result Value Ref Range   Troponin i, poc 0.00 0.00 - 0.08 ng/mL   Comment 3           Ct Head Wo Contrast 06/29/2014   CLINICAL DATA:  Slurred speech beginning 3 hr ago.  History of TIAs.  EXAM: CT HEAD WITHOUT CONTRAST  TECHNIQUE: Contiguous axial images were obtained from the base of the skull through the vertex without intravenous contrast.  COMPARISON:  None  FINDINGS: Ventricles normal in size, for this patient's age, and normal in configuration.  No parenchymal masses or mass effect.  No evidence of an infarct.  Patchy white matter hypoattenuation noted consistent with mild chronic microvascular ischemic change.  No extra-axial masses or abnormal fluid collections.  There is no intracranial hemorrhage.  Visualized sinuses and mastoid air cells are clear. No skull lesion.  IMPRESSION: 1. No acute intracranial abnormalities. 2. Mild age related volume loss. Mild chronic microvascular ischemic change.   Electronically Signed   By: Lajean Manes M.D.   On: 06/29/2014 16:47   Mr Brain Wo Contrast 06/29/2014   CLINICAL DATA:  Episode of aphasia several hr ago. No previous history of stroke.  EXAM: MRI HEAD WITHOUT CONTRAST  TECHNIQUE: Multiplanar, multiecho pulse sequences of the brain and surrounding structures were obtained without intravenous contrast.  COMPARISON:  None.  FINDINGS: Diffusion imaging does not show any acute or subacute infarction. The brainstem and cerebellum are normal. Within the cerebral hemispheres, there are chronic small-vessel ischemic changes affecting the deep and subcortical white matter. No cortical or large vessel territory infarction. No  mass lesion, sign of hemorrhage,  hydrocephalus or extra-axial collection. No pituitary mass. No inflammatory sinus disease. No skull or skullbase lesion.  IMPRESSION: No acute or reversible finding. Moderate chronic small-vessel ischemic changes of the cerebral hemispheric white matter.   Electronically Signed   By: Nelson Chimes M.D.   On: 06/29/2014 16:24    1845:  Neuro exam unchanged. Dx and testing d/w pt and family.  Questions answered.  Verb understanding, agreeable to admit.  T/C to [facility] Dr. Anastasio Champion, case discussed, including:  HPI, pertinent PM/SHx, VS/PE, dx testing, ED course and treatment:  Agreeable to admit, requests to write temporary orders, obtain observation tele bed to team APAdmits.   Francine Graven, DO 07/02/14 1321

## 2014-06-29 NOTE — H&P (Signed)
[facility] History and Physical  Amber Herman QIO:[id number] DOB: 21-Jan-1946 DOA: 06/29/2014  Referring physician: ER PCP: Erma Pinto, FNP   Chief Complaint: Dysphasia  HPI: Amber Herman is a 69 y.o. female  This is a 69 year old lady who today at approximately 1 PM noticed sudden onset of dysphasia. It lasted approximately 15 minutes or so before resolving. She was unable to say the words that she was thinking of. Apparently, family members say that she was talking gibberish. She says that she had an episode like this many years ago. She thinks that her blood glucose may have been low at the time but she cannot say why. She did not check her blood sugar. She denies any facial weakness, numbness or tingling in her extremities or any weakness of her legs or arms. She denies any chest pain, palpitations or limb weakness. She does have a previous history of paroxysmal atrial fibrillation. She is not on any anticoagulation. She is diabetic and hypertensive and also has hyperlipidemia. She is obese. She feels back to her usual self at the present time but was concerned about the symptoms. She is now being admitted for further investigation of possible TIA.   Review of Systems:  Apart from symptoms above, all systems negative.  Past Medical History  Diagnosis Date  . DM (diabetes mellitus), type 2   . Hypertension   . Hyperlipidemia   . GERD (gastroesophageal reflux disease)   . Vitamin D deficiency   . Allergic rhinitis   . Nephrolithiasis   . Transient ischemic attack (TIA)   . Anemia   . Atrial fibrillation with rapid ventricular response 11/25/2011   Past Surgical History  Procedure Laterality Date  . Cholecystectomy     Social History:  reports that she has never smoked. She does not have any smokeless tobacco history on file. She reports that she does not drink alcohol or use illicit drugs.  Allergies  Allergen Reactions  . Angiotensin Receptor Blockers   .  Calcium Channel Blockers   . Simvastatin   . Sulfa Antibiotics Itching and Rash    Family history: there is no apparent family history of cerebrovascular disease.  Prior to Admission medications   Medication Sig Start Date End Date Taking? Authorizing Provider  benazepril (LOTENSIN) 20 MG tablet Take 20 mg by mouth daily. 06/27/14  Yes Historical Provider, MD  carvedilol (COREG) 25 MG tablet Take 1.5 tablets (37.5 mg total) by mouth 2 (two) times daily with a meal. Patient taking differently: Take 25 mg by mouth 2 (two) times daily with a meal.  11/26/11  Yes Rexene Alberts, MD  chlorthalidone (HYGROTON) 25 MG tablet Take 25 mg by mouth daily. 06/27/14  Yes Historical Provider, MD  fluocinonide (LIDEX) 0.05 % external solution Apply 1 application topically at bedtime.  06/20/14  Yes Historical Provider, MD  insulin detemir (LEVEMIR) 100 UNIT/ML injection Inject 28 Units into the skin at bedtime.  11/26/11  Yes Rexene Alberts, MD  metFORMIN (GLUCOPHAGE) 1000 MG tablet Take 1,000 mg by mouth 2 (two) times daily with a meal.   Yes Historical Provider, MD  pravastatin (PRAVACHOL) 40 MG tablet Take 40 mg by mouth at bedtime.    Yes Historical Provider, MD  Vitamin D, Ergocalciferol, (DRISDOL) 50000 UNITS CAPS Take 50,000 Units by mouth every Monday, Wednesday, and Friday.    Yes Historical Provider, MD   Physical Exam: Filed Vitals:   06/29/14 1623 06/29/14 1630 06/29/14 1717 06/29/14 1930  BP: 158/50  158/50 167/63 157/57  Pulse: 60 61 63 61  Temp:      TempSrc:      Resp:   16   Height:      Weight:      SpO2: 100% 99% 98% 98%    Wt Readings from Last 3 Encounters:  06/29/14 65.772 kg (145 lb)  06/29/14 65.772 kg (145 lb)  11/30/11 67.586 kg (149 lb)    General:  Appears calm and comfortable. She is alert and oriented. Her speech is perfectly normal without any evidence of dysphasia at the present time. There is no dysarthria. Eyes: PERRL, normal lids, irises & conjunctiva ENT: grossly  normal hearing, lips & tongue Neck: no LAD, masses or thyromegaly Cardiovascular: RRR, no m/r/g. No LE edema. Telemetry: SR, no arrhythmias  Respiratory: CTA bilaterally, no w/r/r. Normal respiratory effort. Abdomen: soft, ntnd Skin: no rash or induration seen on limited exam Musculoskeletal: grossly normal tone BUE/BLE Psychiatric: grossly normal mood and affect, speech fluent and appropriate Neurologic: grossly non-focal.          Labs on Admission:  Basic Metabolic Panel:  Recent Labs Lab 06/29/14 1515 06/29/14 1612  NA 135 137  K 3.1* 3.2*  CL 100 97  CO2 28  --   GLUCOSE 107* 102*  BUN 19 18  CREATININE 0.70 0.70  CALCIUM 9.0  --    Liver Function Tests:  Recent Labs Lab 06/29/14 1515  AST 17  ALT 16  ALKPHOS 44  BILITOT 0.4  PROT 7.7  ALBUMIN 4.2   No results for input(s): LIPASE, AMYLASE in the last 168 hours. No results for input(s): AMMONIA in the last 168 hours. CBC:  Recent Labs Lab 06/29/14 1515 06/29/14 1612  WBC 8.5  --   NEUTROABS 4.9  --   HGB 10.7* 12.2  HCT 33.0* 36.0  MCV 91.7  --   PLT 297  --    Cardiac Enzymes: No results for input(s): CKTOTAL, CKMB, CKMBINDEX, TROPONINI in the last 168 hours.  BNP (last 3 results) No results for input(s): PROBNP in the last 8760 hours. CBG: No results for input(s): GLUCAP in the last 168 hours.  Radiological Exams on Admission: Ct Head Wo Contrast  06/29/2014   CLINICAL DATA:  Slurred speech beginning 3 hr ago.  History of TIAs.  EXAM: CT HEAD WITHOUT CONTRAST  TECHNIQUE: Contiguous axial images were obtained from the base of the skull through the vertex without intravenous contrast.  COMPARISON:  None  FINDINGS: Ventricles normal in size, for this patient's age, and normal in configuration.  No parenchymal masses or mass effect.  No evidence of an infarct.  Patchy white matter hypoattenuation noted consistent with mild chronic microvascular ischemic change.  No extra-axial masses or abnormal  fluid collections.  There is no intracranial hemorrhage.  Visualized sinuses and mastoid air cells are clear. No skull lesion.  IMPRESSION: 1. No acute intracranial abnormalities. 2. Mild age related volume loss. Mild chronic microvascular ischemic change.   Electronically Signed   By: Lajean Manes M.D.   On: 06/29/2014 16:47   Mr Brain Wo Contrast  06/29/2014   CLINICAL DATA:  Episode of aphasia several hr ago. No previous history of stroke.  EXAM: MRI HEAD WITHOUT CONTRAST  TECHNIQUE: Multiplanar, multiecho pulse sequences of the brain and surrounding structures were obtained without intravenous contrast.  COMPARISON:  None.  FINDINGS: Diffusion imaging does not show any acute or subacute infarction. The brainstem and cerebellum are normal. Within the cerebral hemispheres,  there are chronic small-vessel ischemic changes affecting the deep and subcortical white matter. No cortical or large vessel territory infarction. No mass lesion, sign of hemorrhage, hydrocephalus or extra-axial collection. No pituitary mass. No inflammatory sinus disease. No skull or skullbase lesion.  IMPRESSION: No acute or reversible finding. Moderate chronic small-vessel ischemic changes of the cerebral hemispheric white matter.   Electronically Signed   By: Nelson Chimes M.D.   On: 06/29/2014 16:24      Assessment/Plan   1. Possible TIA. It is not entirely clear whether this is a TIA or not. One has to assume that it is until proven otherwise. Bilateral carotid Dopplers will be useful. MRI brain scan does not show an acute stroke. She'll be started on aspirin. 2. Hypertension. Continue home medications. 3. Type 2 diabetes mellitus. Continue with home medications and sliding scale of insulin. 4. History of anemia. Check anemia panel.  Further recommendations will depend on patient's hospital progress.   Code Status: Full code.   DVT Prophylaxis: Heparin.  Family Communication: I discussed the plan with the patient at  the bedside.  Disposition Plan: Home when medically stable.  Time spent: 60 minutes.  Doree Albee [facility] Pager [phone].

## 2014-06-29 NOTE — ED Notes (Signed)
Pt sitting upright on stretcher in NAD, speech clear. States she's hungry and has a mild headache "probably because I haven't eaten anything today. Dr Anastasio Champion with pt now

## 2014-06-30 ENCOUNTER — Observation Stay (HOSPITAL_COMMUNITY): Payer: Medicare Other

## 2014-06-30 DIAGNOSIS — E118 Type 2 diabetes mellitus with unspecified complications: Secondary | ICD-10-CM

## 2014-06-30 DIAGNOSIS — G459 Transient cerebral ischemic attack, unspecified: Secondary | ICD-10-CM

## 2014-06-30 LAB — LIPID PANEL
CHOLESTEROL: 147 mg/dL (ref 0–200)
HDL: 35 mg/dL — ABNORMAL LOW (ref >39)
LDL CALC: 83 mg/dL (ref 0–99)
Total CHOL/HDL Ratio: 4.2 RATIO
Triglycerides: 146 mg/dL (ref <150)
VLDL: 29 mg/dL (ref 0–40)

## 2014-06-30 LAB — GLUCOSE, CAPILLARY
Glucose-Capillary: 131 mg/dL — ABNORMAL HIGH (ref 70–99)
Glucose-Capillary: 80 mg/dL (ref 70–99)

## 2014-06-30 MED ORDER — ASPIRIN EC 81 MG PO TBEC: 81.0000 mg | DELAYED_RELEASE_TABLET | Freq: Every day | ORAL | Status: DC

## 2014-06-30 MED ORDER — POTASSIUM CHLORIDE CRYS ER 20 MEQ PO TBCR
40.0000 meq | EXTENDED_RELEASE_TABLET | Freq: Once | ORAL | Status: AC
  Administered 2014-06-30: 40 meq via ORAL
  Filled 2014-06-30: qty 2

## 2014-06-30 NOTE — Progress Notes (Signed)
Echocardiogram 2D Echocardiogram has been performed.  Amber Herman 06/30/2014, 9:30 AM

## 2014-06-30 NOTE — Discharge Summary (Signed)
Physician Discharge Summary  Amber Herman [place] OVF:[id number] DOB: 1945/12/13 DOA: 06/29/2014  PCP: Renee Rival, NP  Admit date: 06/29/2014 Discharge date: 06/30/2014  Time spent: 9minutes  Recommendations for Outpatient Follow-up:  1. Follow-up with primary care physician in 1-2 weeks  Discharge Diagnoses:  Active Problems:   Diabetes mellitus, type II   Hypertension   Anemia   TIA (transient ischemic attack)   Discharge Condition: improved  Diet recommendation: low salt, low carb  Filed Weights   06/29/14 1452 06/29/14 2051  Weight: 65.772 kg (145 lb) 66.951 kg (147 lb 9.6 oz)    History of present illness:  This patient was admitted to the hospital with transient dysphasia. She was having difficulty expressing her words. There was no reported changes of vision, extremity weakness/numbness, difficulty with gait, dysphagia or any other complaints. Her episode lasted approximately 10-15 minutes and then self resolved. She initially thought that perhaps her sugar had gone low, although she did not check her blood sugar. She had eaten a candy bar prior to the initiation of her symptoms.  Hospital Course:  Due to concerns of underlying TIA, patient was admitted to the hospital for further workup. MRI of the brain did not show any acute findings. Echocardiogram was also unremarkable, as was carotid Dopplers. Hemoglobin A1c is currently in processing and followed up by primary care physician. The patient is already on a statin. LDL was found to be 83 on lipid panel. The patient was started on daily aspirin. She does not have any deficits at this time is requesting discharge home. She'll follow up with her primary care physician in 1-2 weeks.  Procedures:  Echo: - Left ventricle: The cavity size was normal. Wall thickness was increased in a pattern of moderate LVH. Systolic function was normal. The estimated ejection fraction was in the range of 60% to 65%. Wall motion was  normal; there were no regional wall motion abnormalities. Doppler parameters are consistent with abnormal left ventricular relaxation (grade 1 diastolic dysfunction). - Aortic valve: Valve area (Vmax): 2.59 cm^2.  Consultations:    Discharge Exam: Filed Vitals:   06/29/14 2357  BP: 130/57  Pulse: 76  Temp: 98.5 F (36.9 C)  Resp: 17    General: NAD Cardiovascular: S1, S2 RRR Respiratory: CTA B  Discharge Instructions   Discharge Instructions    Call MD for:  difficulty breathing, headache or visual disturbances    Complete by:  As directed      Diet - low sodium heart healthy    Complete by:  As directed      Diet Carb Modified    Complete by:  As directed      Increase activity slowly    Complete by:  As directed           Discharge Medication List as of 06/30/2014  2:56 PM    START taking these medications   Details  aspirin EC 81 MG tablet Take 1 tablet (81 mg total) by mouth daily., Starting 06/30/2014, Until Discontinued, Print      CONTINUE these medications which have NOT CHANGED   Details  benazepril (LOTENSIN) 20 MG tablet Take 20 mg by mouth daily., Starting 06/27/2014, Until Discontinued, Historical Med    carvedilol (COREG) 25 MG tablet Take 1.5 tablets (37.5 mg total) by mouth 2 (two) times daily with a meal., Starting 11/26/2011, Until Discontinued, Print    chlorthalidone (HYGROTON) 25 MG tablet Take 25 mg by mouth daily., Starting 06/27/2014, Until Discontinued, Historical  Med    fluocinonide (LIDEX) 0.05 % external solution Apply 1 application topically at bedtime. , Starting 06/20/2014, Until Discontinued, Historical Med    insulin detemir (LEVEMIR) 100 UNIT/ML injection Inject 28 Units into the skin at bedtime. , Starting 11/26/2011, Until Discontinued, Historical Med    metFORMIN (GLUCOPHAGE) 1000 MG tablet Take 1,000 mg by mouth 2 (two) times daily with a meal., Until Discontinued, Historical Med    pravastatin (PRAVACHOL) 40 MG tablet  Take 40 mg by mouth at bedtime. , Until Discontinued, Historical Med    Vitamin D, Ergocalciferol, (DRISDOL) 50000 UNITS CAPS Take 50,000 Units by mouth every Monday, Wednesday, and Friday. , Until Discontinued, Historical Med       Allergies  Allergen Reactions  . Angiotensin Receptor Blockers   . Calcium Channel Blockers   . Simvastatin   . Sulfa Antibiotics Itching and Rash   Follow-up Information    Follow up with Renee Rival, NP. Schedule an appointment as soon as possible for a visit in 2 weeks.   Specialty:  Nurse Practitioner   Contact information:   [address] [place] [address] [phone]        The results of significant diagnostics from this hospitalization (including imaging, microbiology, ancillary and laboratory) are listed below for reference.    Significant Diagnostic Studies: Ct Head Wo Contrast  06/29/2014   CLINICAL DATA:  Slurred speech beginning 3 hr ago.  History of TIAs.  EXAM: CT HEAD WITHOUT CONTRAST  TECHNIQUE: Contiguous axial images were obtained from the base of the skull through the vertex without intravenous contrast.  COMPARISON:  None  FINDINGS: Ventricles normal in size, for this patient's age, and normal in configuration.  No parenchymal masses or mass effect.  No evidence of an infarct.  Patchy white matter hypoattenuation noted consistent with mild chronic microvascular ischemic change.  No extra-axial masses or abnormal fluid collections.  There is no intracranial hemorrhage.  Visualized sinuses and mastoid air cells are clear. No skull lesion.  IMPRESSION: 1. No acute intracranial abnormalities. 2. Mild age related volume loss. Mild chronic microvascular ischemic change.   Electronically Signed   By: Lajean Manes M.D.   On: 06/29/2014 16:47   Mr Brain Wo Contrast  06/29/2014   CLINICAL DATA:  Episode of aphasia several hr ago. No previous history of stroke.  EXAM: MRI HEAD WITHOUT CONTRAST  TECHNIQUE: Multiplanar, multiecho  pulse sequences of the brain and surrounding structures were obtained without intravenous contrast.  COMPARISON:  None.  FINDINGS: Diffusion imaging does not show any acute or subacute infarction. The brainstem and cerebellum are normal. Within the cerebral hemispheres, there are chronic small-vessel ischemic changes affecting the deep and subcortical white matter. No cortical or large vessel territory infarction. No mass lesion, sign of hemorrhage, hydrocephalus or extra-axial collection. No pituitary mass. No inflammatory sinus disease. No skull or skullbase lesion.  IMPRESSION: No acute or reversible finding. Moderate chronic small-vessel ischemic changes of the cerebral hemispheric white matter.   Electronically Signed   By: Nelson Chimes M.D.   On: 06/29/2014 16:24   US Carotid Duplex Bilateral  06/30/2014   CLINICAL DATA:  Transient ischemic attack.  Dysphagia.  EXAM: BILATERAL CAROTID DUPLEX ULTRASOUND  TECHNIQUE: Pearline Cables scale imaging, color Doppler and duplex ultrasound were performed of bilateral carotid and vertebral arteries in the neck.  COMPARISON:  None.  FINDINGS: Criteria: Quantification of carotid stenosis is based on velocity parameters that correlate the residual internal carotid diameter with NASCET-based stenosis levels,  using the diameter of the distal internal carotid lumen as the denominator for stenosis measurement.  The following velocity measurements were obtained:  RIGHT  ICA:  111/31 cm/sec  CCA:  16/10 cm/sec  SYSTOLIC ICA/CCA RATIO:  1.4  DIASTOLIC ICA/CCA RATIO:  2.7  ECA:  148 cm/sec  LEFT  ICA:  129/38 cm/sec  CCA:  96/04 cm/sec  SYSTOLIC ICA/CCA RATIO:  1.4  DIASTOLIC ICA/CCA RATIO:  2.4  ECA:  121 cm/sec  RIGHT CAROTID ARTERY: Mild plaque formation is noted in the right common carotid artery. Mild eccentric calcified and irregular plaque is seen involving the proximal right internal carotid artery consistent with less than 50% diameter stenosis based on ultrasound and Doppler  criteria.  RIGHT VERTEBRAL ARTERY:  Antegrade flow is noted.  LEFT CAROTID ARTERY: Mild eccentric plaque formation is noted in the left carotid bulb. Moderate irregular and calcified plaque is noted in the proximal left internal carotid artery consistent with 50-69% stenosis based on Doppler and ultrasound criteria.  LEFT VERTEBRAL ARTERY:  Antegrade flow is noted.  IMPRESSION: Mild calcified plaque formation is noted in the proximal right internal carotid artery consistent with less than 50% diameter stenosis.  Moderate calcified plaque formation is noted in the proximal left internal carotid artery consistent with 50-69% stenosis.   Electronically Signed   By: Sabino Dick M.D.   On: 06/30/2014 12:22    Microbiology: No results found for this or any previous visit (from the past 240 hour(s)).   Labs: Basic Metabolic Panel:  Recent Labs Lab 06/29/14 1515 06/29/14 1612  NA 135 137  K 3.1* 3.2*  CL 100 97  CO2 28  --   GLUCOSE 107* 102*  BUN 19 18  CREATININE 0.70 0.70  CALCIUM 9.0  --    Liver Function Tests:  Recent Labs Lab 06/29/14 1515  AST 17  ALT 16  ALKPHOS 44  BILITOT 0.4  PROT 7.7  ALBUMIN 4.2   No results for input(s): LIPASE, AMYLASE in the last 168 hours. No results for input(s): AMMONIA in the last 168 hours. CBC:  Recent Labs Lab 06/29/14 1515 06/29/14 1612  WBC 8.5  --   NEUTROABS 4.9  --   HGB 10.7* 12.2  HCT 33.0* 36.0  MCV 91.7  --   PLT 297  --    Cardiac Enzymes: No results for input(s): CKTOTAL, CKMB, CKMBINDEX, TROPONINI in the last 168 hours. BNP: BNP (last 3 results) No results for input(s): PROBNP in the last 8760 hours. CBG:  Recent Labs Lab 06/29/14 2042 06/30/14 0746 06/30/14 1138  GLUCAP 233* 131* 80       Signed:  Teagen Bucio  [facility] 06/30/2014, 7:27 PM

## 2014-07-01 LAB — IRON AND TIBC
IRON: 78 ug/dL (ref 42–145)
Saturation Ratios: 24 % (ref 20–55)
TIBC: 329 ug/dL (ref 250–470)
UIBC: 251 ug/dL (ref 125–400)

## 2014-07-01 LAB — VITAMIN B12: Vitamin B-12: 213 pg/mL (ref 211–911)

## 2014-07-01 LAB — FERRITIN: Ferritin: 75 ng/mL (ref 10–291)

## 2014-07-01 LAB — FOLATE: FOLATE: 18.9 ng/mL

## 2014-07-02 LAB — HEMOGLOBIN A1C
HEMOGLOBIN A1C: 8 % — AB (ref 4.8–5.6)
MEAN PLASMA GLUCOSE: 183 mg/dL

## 2017-02-26 ENCOUNTER — Encounter (HOSPITAL_COMMUNITY): Payer: Self-pay | Admitting: *Deleted

## 2017-02-26 ENCOUNTER — Emergency Department (HOSPITAL_COMMUNITY): Payer: Medicare Other

## 2017-02-26 ENCOUNTER — Inpatient Hospital Stay (HOSPITAL_COMMUNITY)
Admission: EM | Admit: 2017-02-26 | Discharge: 2017-03-01 | DRG: 690 | Disposition: A | Discharge status: Home / Self Care | Payer: Medicare Other | Attending: Internal Medicine | Admitting: Internal Medicine

## 2017-02-26 DIAGNOSIS — Z882 Allergy status to sulfonamides status: Secondary | ICD-10-CM

## 2017-02-26 DIAGNOSIS — E871 Hypo-osmolality and hyponatremia: Secondary | ICD-10-CM | POA: Diagnosis present

## 2017-02-26 DIAGNOSIS — E119 Type 2 diabetes mellitus without complications: Secondary | ICD-10-CM

## 2017-02-26 DIAGNOSIS — N12 Tubulo-interstitial nephritis, not specified as acute or chronic: Principal | ICD-10-CM | POA: Diagnosis present

## 2017-02-26 DIAGNOSIS — M545 Low back pain, unspecified: Secondary | ICD-10-CM

## 2017-02-26 DIAGNOSIS — I10 Essential (primary) hypertension: Secondary | ICD-10-CM | POA: Diagnosis present

## 2017-02-26 DIAGNOSIS — I480 Paroxysmal atrial fibrillation: Secondary | ICD-10-CM

## 2017-02-26 DIAGNOSIS — R112 Nausea with vomiting, unspecified: Secondary | ICD-10-CM | POA: Diagnosis present

## 2017-02-26 DIAGNOSIS — Z888 Allergy status to other drugs, medicaments and biological substances status: Secondary | ICD-10-CM

## 2017-02-26 DIAGNOSIS — N3000 Acute cystitis without hematuria: Secondary | ICD-10-CM | POA: Diagnosis present

## 2017-02-26 DIAGNOSIS — Z8673 Personal history of transient ischemic attack (TIA), and cerebral infarction without residual deficits: Secondary | ICD-10-CM

## 2017-02-26 DIAGNOSIS — Z79899 Other long term (current) drug therapy: Secondary | ICD-10-CM

## 2017-02-26 DIAGNOSIS — E785 Hyperlipidemia, unspecified: Secondary | ICD-10-CM | POA: Diagnosis present

## 2017-02-26 DIAGNOSIS — E860 Dehydration: Secondary | ICD-10-CM | POA: Diagnosis present

## 2017-02-26 DIAGNOSIS — Z7984 Long term (current) use of oral hypoglycemic drugs: Secondary | ICD-10-CM

## 2017-02-26 DIAGNOSIS — K219 Gastro-esophageal reflux disease without esophagitis: Secondary | ICD-10-CM | POA: Diagnosis present

## 2017-02-26 DIAGNOSIS — Z7982 Long term (current) use of aspirin: Secondary | ICD-10-CM

## 2017-02-26 DIAGNOSIS — Z23 Encounter for immunization: Secondary | ICD-10-CM

## 2017-02-26 LAB — CBC WITH DIFFERENTIAL/PLATELET
BASOS ABS: 0 10*3/uL (ref 0.0–0.1)
Basophils Relative: 0 %
EOS ABS: 0.1 10*3/uL (ref 0.0–0.7)
EOS PCT: 1 %
HCT: 31.6 % — ABNORMAL LOW (ref 36.0–46.0)
HEMOGLOBIN: 10.8 g/dL — AB (ref 12.0–15.0)
Lymphocytes Relative: 9 %
Lymphs Abs: 1.3 10*3/uL (ref 0.7–4.0)
MCH: 30 pg (ref 26.0–34.0)
MCHC: 34.2 g/dL (ref 30.0–36.0)
MCV: 87.8 fL (ref 78.0–100.0)
MONO ABS: 1.4 10*3/uL — AB (ref 0.1–1.0)
Monocytes Relative: 10 %
Neutro Abs: 11.8 10*3/uL — ABNORMAL HIGH (ref 1.7–7.7)
Neutrophils Relative %: 80 %
Platelets: 321 10*3/uL (ref 150–400)
RBC: 3.6 MIL/uL — ABNORMAL LOW (ref 3.87–5.11)
RDW: 12.4 % (ref 11.5–15.5)
WBC: 14.7 10*3/uL — ABNORMAL HIGH (ref 4.0–10.5)

## 2017-02-26 LAB — URINALYSIS, ROUTINE W REFLEX MICROSCOPIC
Bacteria, UA: NONE SEEN
Bilirubin Urine: NEGATIVE
Glucose, UA: NEGATIVE mg/dL
Hgb urine dipstick: NEGATIVE
Ketones, ur: 20 mg/dL — AB
NITRITE: NEGATIVE
Protein, ur: NEGATIVE mg/dL
Specific Gravity, Urine: 1.012 (ref 1.005–1.030)
pH: 6 (ref 5.0–8.0)

## 2017-02-26 LAB — COMPREHENSIVE METABOLIC PANEL
ALBUMIN: 3.8 g/dL (ref 3.5–5.0)
ALK PHOS: 53 U/L (ref 38–126)
ALT: 12 U/L — ABNORMAL LOW (ref 14–54)
ANION GAP: 12 (ref 5–15)
AST: 15 U/L (ref 15–41)
BILIRUBIN TOTAL: 1 mg/dL (ref 0.3–1.2)
BUN: 15 mg/dL (ref 6–20)
CALCIUM: 9 mg/dL (ref 8.9–10.3)
CO2: 24 mmol/L (ref 22–32)
Chloride: 88 mmol/L — ABNORMAL LOW (ref 101–111)
Creatinine, Ser: 0.72 mg/dL (ref 0.44–1.00)
GFR calc Af Amer: >60 mL/min (ref >60)
GLUCOSE: 177 mg/dL — AB (ref 65–99)
POTASSIUM: 3.4 mmol/L — AB (ref 3.5–5.1)
Sodium: 124 mmol/L — ABNORMAL LOW (ref 135–145)
TOTAL PROTEIN: 7.6 g/dL (ref 6.5–8.1)

## 2017-02-26 LAB — MAGNESIUM: Magnesium: 1.4 mg/dL — ABNORMAL LOW (ref 1.7–2.4)

## 2017-02-26 LAB — I-STAT CG4 LACTIC ACID, ED: Lactic Acid, Venous: 0.83 mmol/L (ref 0.5–1.9)

## 2017-02-26 MED ORDER — SODIUM CHLORIDE 0.9 % IV BOLUS (SEPSIS)
1000.0000 mL | Freq: Once | INTRAVENOUS | Status: AC
  Administered 2017-02-26: 1000 mL via INTRAVENOUS

## 2017-02-26 MED ORDER — MORPHINE SULFATE (PF) 4 MG/ML IV SOLN
4.0000 mg | Freq: Once | INTRAVENOUS | Status: AC
  Administered 2017-02-26: 4 mg via INTRAVENOUS
  Filled 2017-02-26: qty 1

## 2017-02-26 MED ORDER — ONDANSETRON HCL 4 MG/2ML IJ SOLN
4.0000 mg | Freq: Once | INTRAMUSCULAR | Status: AC
  Administered 2017-02-26: 4 mg via INTRAVENOUS
  Filled 2017-02-26: qty 2

## 2017-02-26 MED ORDER — CEFTRIAXONE SODIUM 1 G IJ SOLR
1.0000 g | Freq: Once | INTRAMUSCULAR | Status: AC
  Administered 2017-02-26: 1 g via INTRAVENOUS
  Filled 2017-02-26: qty 10

## 2017-02-26 MED ORDER — SODIUM CHLORIDE 0.9 % IV SOLN
INTRAVENOUS | Status: DC
  Administered 2017-02-26: 1000 mL via INTRAVENOUS

## 2017-02-26 NOTE — ED Triage Notes (Signed)
Nausea and vomiting onset today, diagnosed with UTI yesterday

## 2017-02-26 NOTE — ED Provider Notes (Signed)
[facility] DEPT Provider Note   CSN: [id number] Arrival date & time: 02/26/17  1947     History   Chief Complaint Chief Complaint  Patient presents with  . Nausea  . Emesis    HPI Amber Herman is a 71 y.o. female.  Patient presents with recurrent nausea and vomiting since this morning. Patient is diagnosed with UTI yesterday however unable to keep any of her medicines down. No fevers or chills. Patient has history of atrial fibrillation, diabetes, reflux high blood pressure. Patient is on aspirin. Pt has back pain with sxs as well.  .      Past Medical History:  Diagnosis Date  . Allergic rhinitis   . Anemia   . Atrial fibrillation with rapid ventricular response ([place]) 11/25/2011  . DM (diabetes mellitus), type 2 ([place])   . GERD (gastroesophageal reflux disease)   . Hyperlipidemia   . Hypertension   . Nephrolithiasis   . Transient ischemic attack (TIA)   . Vitamin D deficiency     Patient Active Problem List   Diagnosis Date Noted  . TIA (transient ischemic attack) 06/29/2014  . Long term (current) use of anticoagulants 11/30/2011  . Anemia 11/26/2011  . Atrial fibrillation with rapid ventricular response ([place]) 11/25/2011  . Hyponatremia 11/25/2011  . Nausea 11/25/2011  . Diarrhea 11/25/2011  . Hypomagnesemia 11/25/2011  . Diabetes mellitus, type II ([place]) 11/25/2011  . Hypertension 11/25/2011    Past Surgical History:  Procedure Laterality Date  . CHOLECYSTECTOMY      OB History    No data available       Home Medications    Prior to Admission medications   Medication Sig Start Date End Date Taking? Authorizing Provider  amoxicillin-clavulanate (AUGMENTIN) 875-125 MG tablet Take 1 tablet by mouth 2 (two) times daily. Take 1 tablet twice daily for 7 days. 01/18/17  Yes [provider]  aspirin EC 81 MG tablet Take 1 tablet (81 mg total) by mouth daily. 06/30/14  Yes Kathie Dike, MD  carvedilol (COREG) 25 MG tablet Take 1.5 tablets  (37.5 mg total) by mouth 2 (two) times daily with a meal. Patient taking differently: Take 25 mg by mouth 2 (two) times daily with a meal.  11/26/11  Yes Rexene Alberts, MD  chlorthalidone (HYGROTON) 50 MG tablet Take 50 mg by mouth daily.   Yes [provider]  fluocinonide (LIDEX) 0.05 % external solution Apply 1 application topically at bedtime.  06/20/14  Yes [provider]  metFORMIN (GLUCOPHAGE) 1000 MG tablet Take 1,000 mg by mouth 2 (two) times daily with a meal.   Yes [provider]  NIFEdipine (PROCARDIA-XL/ADALAT CC) 30 MG 24 hr tablet Take 1 tablet by mouth daily. Take 1 tablet by mouth once a day 02/18/17  Yes [provider]  Potassium Chloride Crys ER (KLOR-CON M20 PO) Take 1 tablet by mouth daily.   Yes [provider]  pravastatin (PRAVACHOL) 40 MG tablet Take 40 mg by mouth at bedtime.    Yes [provider]  sulfamethoxazole-trimethoprim (BACTRIM DS,SEPTRA DS) 800-160 MG tablet Take 1 tablet by mouth 2 (two) times daily. Take 1 tablet by mouth twice a day for 10 days.   Yes [provider]  TRESIBA FLEXTOUCH 200 UNIT/ML SOPN Inject 36 Units into the skin at bedtime. 02/10/17  Yes [provider]  Vitamin D, Ergocalciferol, (DRISDOL) 50000 UNITS CAPS Take 50,000 Units by mouth every Monday, Wednesday, and Friday.    Yes [provider]  benazepril (LOTENSIN) 20 MG tablet Take 20 mg by mouth daily. 06/27/14   [provider]  chlorthalidone (HYGROTON) 25 MG tablet Take 25 mg by mouth daily. 06/27/14   [provider]    Family History No family history on file.  Social History Social History  Substance Use Topics  . Smoking status: Never Smoker  . Smokeless tobacco: Never Used  . Alcohol use No     Allergies   Angiotensin receptor blockers; Calcium channel blockers; Simvastatin; and Sulfa antibiotics   Review of Systems Review of Systems  Constitutional: Positive for  fatigue. Negative for chills and fever.  HENT: Negative for congestion.   Eyes: Negative for visual disturbance.  Respiratory: Negative for shortness of breath.   Cardiovascular: Negative for chest pain.  Gastrointestinal: Positive for nausea and vomiting. Negative for abdominal pain.  Genitourinary: Positive for flank pain.  Musculoskeletal: Positive for back pain. Negative for neck pain and neck stiffness.  Skin: Negative for rash.  Neurological: Positive for light-headedness. Negative for headaches.     Physical Exam Updated Vital Signs BP (!) 136/57   Pulse 66   Temp 97.9 F (36.6 C)   Resp 18   Ht 5\' 4"  (1.626 m)   Wt 67.1 kg (148 lb)   SpO2 97%   BMI 25.40 kg/m   Physical Exam  Constitutional: She is oriented to person, place, and time. She appears well-developed and well-nourished.  HENT:  Head: Normocephalic and atraumatic.  Dry mm  Eyes: Right eye exhibits no discharge. Left eye exhibits no discharge.  Neck: Normal range of motion. Neck supple. No tracheal deviation present.  Cardiovascular: Normal rate and regular rhythm.   Pulmonary/Chest: Effort normal and breath sounds normal.  Abdominal: Soft. She exhibits no distension. There is no tenderness. There is no guarding.  Musculoskeletal: She exhibits tenderness (mild mid back). She exhibits no edema.  Neurological: She is alert and oriented to person, place, and time.  Skin: Skin is warm. No rash noted.  Psychiatric: She has a normal mood and affect.  Nursing note and vitals reviewed.    ED Treatments / Results  Labs (all labs ordered are listed, but only abnormal results are displayed) Labs Reviewed  CBC WITH DIFFERENTIAL/PLATELET - Abnormal; Notable for the following:       Result Value   WBC 14.7 (*)    RBC 3.60 (*)    Hemoglobin 10.8 (*)    HCT 31.6 (*)    Neutro Abs 11.8 (*)    Monocytes Absolute 1.4 (*)    All other components within normal limits  COMPREHENSIVE METABOLIC PANEL - Abnormal;  Notable for the following:    Sodium 124 (*)    Potassium 3.4 (*)    Chloride 88 (*)    Glucose, Bld 177 (*)    ALT 12 (*)    All other components within normal limits  URINALYSIS, ROUTINE W REFLEX MICROSCOPIC - Abnormal; Notable for the following:    Ketones, ur 20 (*)    Leukocytes, UA TRACE (*)    Squamous Epithelial / LPF 0-5 (*)    All other components within normal limits  URINE CULTURE  MAGNESIUM  I-STAT CG4 LACTIC ACID, ED    EKG  EKG Interpretation None       Radiology Ct Renal Stone Study  Result Date: 02/26/2017 CLINICAL DATA:  71 y/o F; nausea and vomiting today. Diagnosed with urinary tract infection yesterday. Stone disease suspected. EXAM: CT ABDOMEN AND PELVIS WITHOUT CONTRAST TECHNIQUE: Multidetector  CT imaging of the abdomen and pelvis was performed following the standard protocol without IV contrast. COMPARISON:  11/30/2011 CT abdomen and pelvis. FINDINGS: Lower chest: No acute abnormality. Hepatobiliary: No focal liver abnormality is seen. Status post cholecystectomy. No biliary dilatation. Pancreas: Unremarkable. No pancreatic ductal dilatation or surrounding inflammatory changes. Spleen: Normal in size without focal abnormality. Adrenals/Urinary Tract: Adrenal glands are unremarkable. Kidneys are normal, without renal calculi, focal lesion, or hydronephrosis. Mild bilateral perinephric stranding is increased compared with prior CT of abdomen and pelvis. Bladder is unremarkable. Stomach/Bowel: Stomach is within normal limits. Extensive sigmoid diverticulosis. No evidence of bowel wall thickening, distention, or inflammatory changes. Vascular/Lymphatic: Aortic atherosclerosis. No enlarged abdominal or pelvic lymph nodes. Reproductive: Stable myomatous uterus. Other: No abdominal wall hernia or abnormality. No abdominopelvic ascites. Musculoskeletal: Moderate multilevel degenerative changes of the thoracic and lumbar spine with disc space narrowing, disc bulges, facet  arthropathy. No acute fracture identified. IMPRESSION: 1. Mild nonspecific perinephric stranding is increased from prior CT of abdomen and pelvis and may reflect underlying inflammation or infection of the renal collecting systems. No hydronephrosis or nephrolithiasis. Normal bladder. 2. Stable myomatous uterus. 3. Aortic atherosclerosis. 4. Moderate degenerative changes of visible thoracic and lumbar spine. 5. Extensive sigmoid diverticulosis without findings of acute diverticulitis Electronically Signed   By: Kristine Garbe M.D.   On: 02/26/2017 22:38    Procedures Procedures (including critical care time)  Medications Ordered in ED Medications  0.9 %  sodium chloride infusion (1,000 mLs Intravenous New Bag/Given 02/26/17 2137)  cefTRIAXone (ROCEPHIN) 1 g in dextrose 5 % 50 mL IVPB (not administered)  sodium chloride 0.9 % bolus 1,000 mL (0 mLs Intravenous Stopped 02/26/17 2059)  ondansetron (ZOFRAN) injection 4 mg (4 mg Intravenous Given 02/26/17 2007)     Initial Impression / Assessment and Plan / ED Course  I have reviewed the triage vital signs and the nursing notes.  Pertinent labs & imaging results that were available during my care of the patient were reviewed by me and considered in my medical decision making (see chart for details).    Patient presents with worsening nausea and vomiting along with recent urine infection diagnosis. Clinical concern for pyelonephritis. Plan for IV fluids, antiemetics, blood work and admission with IV antibiotics.  Urinalysis not consistent with severe infection. Plan for CT stone study look for other cause of her flank pain and symptoms.  IV fluids for hypnatremia  The patients results and plan were reviewed and discussed.   Any x-rays performed were independently reviewed by myself.   Differential diagnosis were considered with the presenting HPI.  Medications  0.9 %  sodium chloride infusion (1,000 mLs Intravenous New Bag/Given  02/26/17 2137)  cefTRIAXone (ROCEPHIN) 1 g in dextrose 5 % 50 mL IVPB (not administered)  sodium chloride 0.9 % bolus 1,000 mL (0 mLs Intravenous Stopped 02/26/17 2059)  ondansetron (ZOFRAN) injection 4 mg (4 mg Intravenous Given 02/26/17 2007)    Vitals:   02/26/17 1955 02/26/17 2000 02/26/17 2100 02/26/17 2200  BP:  (!) 185/60 (!) 177/58 (!) 136/57  Pulse:  64 65 66  Resp:      Temp: 97.9 F (36.6 C)     SpO2:  98% 99% 97%  Weight:      Height:        Final diagnoses:  Non-intractable vomiting with nausea, unspecified vomiting type  Hyponatremia  Acute cystitis without hematuria    Admission/ observation were discussed with the admitting physician, patient and/or family and they  are comfortable with the plan.    Final Clinical Impressions(s) / ED Diagnoses   Final diagnoses:  Non-intractable vomiting with nausea, unspecified vomiting type  Hyponatremia  Acute cystitis without hematuria    New Prescriptions New Prescriptions   No medications on file     Elnora Morrison, MD 02/26/17 2256

## 2017-02-27 DIAGNOSIS — E119 Type 2 diabetes mellitus without complications: Secondary | ICD-10-CM | POA: Diagnosis present

## 2017-02-27 DIAGNOSIS — R112 Nausea with vomiting, unspecified: Secondary | ICD-10-CM | POA: Diagnosis present

## 2017-02-27 DIAGNOSIS — E871 Hypo-osmolality and hyponatremia: Secondary | ICD-10-CM | POA: Diagnosis present

## 2017-02-27 DIAGNOSIS — Z23 Encounter for immunization: Secondary | ICD-10-CM | POA: Diagnosis not present

## 2017-02-27 DIAGNOSIS — Z79899 Other long term (current) drug therapy: Secondary | ICD-10-CM | POA: Diagnosis not present

## 2017-02-27 DIAGNOSIS — Z7982 Long term (current) use of aspirin: Secondary | ICD-10-CM | POA: Diagnosis not present

## 2017-02-27 DIAGNOSIS — Z8673 Personal history of transient ischemic attack (TIA), and cerebral infarction without residual deficits: Secondary | ICD-10-CM | POA: Diagnosis not present

## 2017-02-27 DIAGNOSIS — N12 Tubulo-interstitial nephritis, not specified as acute or chronic: Principal | ICD-10-CM

## 2017-02-27 DIAGNOSIS — E785 Hyperlipidemia, unspecified: Secondary | ICD-10-CM | POA: Diagnosis present

## 2017-02-27 DIAGNOSIS — I10 Essential (primary) hypertension: Secondary | ICD-10-CM | POA: Diagnosis present

## 2017-02-27 DIAGNOSIS — Z888 Allergy status to other drugs, medicaments and biological substances status: Secondary | ICD-10-CM | POA: Diagnosis not present

## 2017-02-27 DIAGNOSIS — K219 Gastro-esophageal reflux disease without esophagitis: Secondary | ICD-10-CM | POA: Diagnosis present

## 2017-02-27 DIAGNOSIS — Z7984 Long term (current) use of oral hypoglycemic drugs: Secondary | ICD-10-CM | POA: Diagnosis not present

## 2017-02-27 DIAGNOSIS — N3000 Acute cystitis without hematuria: Secondary | ICD-10-CM | POA: Diagnosis present

## 2017-02-27 DIAGNOSIS — E860 Dehydration: Secondary | ICD-10-CM | POA: Diagnosis present

## 2017-02-27 DIAGNOSIS — I480 Paroxysmal atrial fibrillation: Secondary | ICD-10-CM

## 2017-02-27 DIAGNOSIS — M545 Low back pain: Secondary | ICD-10-CM | POA: Diagnosis present

## 2017-02-27 DIAGNOSIS — Z882 Allergy status to sulfonamides status: Secondary | ICD-10-CM | POA: Diagnosis not present

## 2017-02-27 LAB — CBC
HEMATOCRIT: 27.7 % — AB (ref 36.0–46.0)
Hemoglobin: 9.5 g/dL — ABNORMAL LOW (ref 12.0–15.0)
MCH: 30.4 pg (ref 26.0–34.0)
MCHC: 34.3 g/dL (ref 30.0–36.0)
MCV: 88.8 fL (ref 78.0–100.0)
PLATELETS: 296 10*3/uL (ref 150–400)
RBC: 3.12 MIL/uL — AB (ref 3.87–5.11)
RDW: 12.5 % (ref 11.5–15.5)
WBC: 10 10*3/uL (ref 4.0–10.5)

## 2017-02-27 LAB — BASIC METABOLIC PANEL
Anion gap: 8 (ref 5–15)
BUN: 12 mg/dL (ref 6–20)
CHLORIDE: 94 mmol/L — AB (ref 101–111)
CO2: 27 mmol/L (ref 22–32)
CREATININE: 0.76 mg/dL (ref 0.44–1.00)
Calcium: 8.3 mg/dL — ABNORMAL LOW (ref 8.9–10.3)
GFR calc non Af Amer: >60 mL/min (ref >60)
Glucose, Bld: 107 mg/dL — ABNORMAL HIGH (ref 65–99)
POTASSIUM: 3.5 mmol/L (ref 3.5–5.1)
SODIUM: 129 mmol/L — AB (ref 135–145)

## 2017-02-27 LAB — GLUCOSE, CAPILLARY
GLUCOSE-CAPILLARY: 106 mg/dL — AB (ref 65–99)
GLUCOSE-CAPILLARY: 129 mg/dL — AB (ref 65–99)
GLUCOSE-CAPILLARY: 217 mg/dL — AB (ref 65–99)
GLUCOSE-CAPILLARY: 129 mg/dL — AB (ref 65–99)
Glucose-Capillary: 158 mg/dL — ABNORMAL HIGH (ref 65–99)

## 2017-02-27 MED ORDER — ONDANSETRON HCL 4 MG PO TABS: 4.0000 mg | ORAL_TABLET | Freq: Four times a day (QID) | ORAL | Status: DC | PRN

## 2017-02-27 MED ORDER — INSULIN ASPART 100 UNIT/ML ~~LOC~~ SOLN
0.0000 [IU] | Freq: Three times a day (TID) | SUBCUTANEOUS | Status: DC
  Administered 2017-02-27: 1 [IU] via SUBCUTANEOUS
  Administered 2017-02-27: 3 [IU] via SUBCUTANEOUS
  Administered 2017-02-28 – 2017-03-01 (×4): 1 [IU] via SUBCUTANEOUS

## 2017-02-27 MED ORDER — CEFTRIAXONE SODIUM 2 G IJ SOLR
2.0000 g | INTRAMUSCULAR | Status: DC
  Administered 2017-02-27 – 2017-02-28 (×2): 2 g via INTRAVENOUS
  Filled 2017-02-27 (×3): qty 2

## 2017-02-27 MED ORDER — BOOST / RESOURCE BREEZE PO LIQD
1.0000 | Freq: Two times a day (BID) | ORAL | Status: DC
  Administered 2017-02-27 (×2): 1 via ORAL

## 2017-02-27 MED ORDER — POTASSIUM CHLORIDE IN NACL 40-0.9 MEQ/L-% IV SOLN
INTRAVENOUS | Status: AC
  Administered 2017-02-27 (×2): 75 mL/h via INTRAVENOUS

## 2017-02-27 MED ORDER — CARVEDILOL 3.125 MG PO TABS
3.1250 mg | ORAL_TABLET | Freq: Two times a day (BID) | ORAL | Status: DC
  Administered 2017-02-27 – 2017-02-28 (×3): 3.125 mg via ORAL
  Filled 2017-02-27 (×3): qty 1

## 2017-02-27 MED ORDER — INSULIN ASPART 100 UNIT/ML ~~LOC~~ SOLN: 0.0000 [IU] | Freq: Three times a day (TID) | SUBCUTANEOUS | Status: DC

## 2017-02-27 MED ORDER — ENSURE ENLIVE PO LIQD: 237.0000 mL | Freq: Two times a day (BID) | ORAL | Status: DC

## 2017-02-27 MED ORDER — ONDANSETRON HCL 4 MG/2ML IJ SOLN
4.0000 mg | Freq: Four times a day (QID) | INTRAMUSCULAR | Status: DC | PRN
  Administered 2017-02-27 (×2): 4 mg via INTRAVENOUS
  Filled 2017-02-27 (×2): qty 2

## 2017-02-27 MED ORDER — KETOROLAC TROMETHAMINE 15 MG/ML IJ SOLN
15.0000 mg | Freq: Three times a day (TID) | INTRAMUSCULAR | Status: DC | PRN
  Administered 2017-02-27 – 2017-02-28 (×3): 15 mg via INTRAVENOUS
  Filled 2017-02-27 (×3): qty 1

## 2017-02-27 MED ORDER — INSULIN ASPART 100 UNIT/ML ~~LOC~~ SOLN: 0.0000 [IU] | Freq: Every day | SUBCUTANEOUS | Status: DC

## 2017-02-27 MED ORDER — ALUM & MAG HYDROXIDE-SIMETH 200-200-20 MG/5ML PO SUSP
30.0000 mL | Freq: Four times a day (QID) | ORAL | Status: DC | PRN
  Filled 2017-02-27: qty 30

## 2017-02-27 MED ORDER — POTASSIUM CHLORIDE IN NACL 40-0.9 MEQ/L-% IV SOLN
INTRAVENOUS | Status: DC
  Administered 2017-02-27: 100 mL/h via INTRAVENOUS

## 2017-02-27 MED ORDER — DEXTROSE 5 % IV SOLN
1.0000 g | Freq: Once | INTRAVENOUS | Status: AC
  Administered 2017-02-27: 1 g via INTRAVENOUS
  Filled 2017-02-27 (×2): qty 10

## 2017-02-27 MED ORDER — INSULIN GLARGINE 100 UNIT/ML ~~LOC~~ SOLN
5.0000 [IU] | Freq: Every day | SUBCUTANEOUS | Status: DC
  Administered 2017-02-27 – 2017-02-28 (×2): 5 [IU] via SUBCUTANEOUS
  Filled 2017-02-27 (×3): qty 0.05

## 2017-02-27 MED ORDER — ASPIRIN 81 MG PO CHEW
81.0000 mg | CHEWABLE_TABLET | Freq: Every day | ORAL | Status: DC
  Administered 2017-02-27 – 2017-03-01 (×3): 81 mg via ORAL
  Filled 2017-02-27 (×3): qty 1

## 2017-02-27 MED ORDER — PRAVASTATIN SODIUM 40 MG PO TABS
40.0000 mg | ORAL_TABLET | Freq: Every day | ORAL | Status: DC
  Administered 2017-02-27 – 2017-02-28 (×2): 40 mg via ORAL
  Filled 2017-02-27 (×2): qty 1

## 2017-02-27 NOTE — Progress Notes (Signed)
Initial Nutrition Assessment  DOCUMENTATION CODES:  Not applicable  INTERVENTION:  D/c ensure  Boost Breeze po BID, each supplement provides 250 kcal and 9 grams of protein  NUTRITION DIAGNOSIS:  Inadequate oral intake related to inability to eat, nausea, vomiting, acute illness (Pyelonephritis) as evidenced by per patient/family report of inability to hold things down for >/= 2 days  GOAL:  Patient will meet greater than or equal to 90% of their needs  MONITOR:  PO intake, Supplement acceptance, Labs, Weight trends, I & O's  REASON FOR ASSESSMENT:  Malnutrition Screening Tool    ASSESSMENT:  71 y/o female PMHx afib, HTN/HLD, DM, GERD, TIA. Presented with >1 day N/V and right flank pain w/ urinary frequency after being given Bactrim for a UTI. Worked up for Pyelonephritis and admitted for management.   The patient had reported on the MST that she had not had decreased PO intake due to a poor appetite and was unsure if she had lost weight.   Per review of Care Everywhere, the patient has not lost weight-she was weighing 150 lbs in April of this year and has actually gained weight long term. However, per notes, she has had very poor po intake for the last couple days due to an inability to hold anything down.   Per provider note this morning, she is feeling better, though still has R flank pain.   Their is no documented PO intake at this time. Her BGs are fairly well controlled. Will change supplements to [facility] as this is less likely to aggravate nausea  NFPE: unable to assess  Labs: Na: 129, BGs 105-175, Mag 1.4, K has been corrected Meds: Ensure, Insulin, IVF, IV abx, PRN zofran   Recent Labs Lab 02/26/17 1955 02/27/17 0540  NA 124* 129*  K 3.4* 3.5  CL 88* 94*  CO2 24 27  BUN 15 12  CREATININE 0.72 0.76  CALCIUM 9.0 8.3*  MG 1.4*  --   GLUCOSE 177* 107*   Diet Order:  Diet Carb Modified Fluid consistency: Thin; Room service appropriate? Yes  Skin:  Reviewed,  no issues  Last BM:  Unknown  Height:  Ht Readings from Last 1 Encounters:  02/27/17 5\' 4"  (1.626 m)   Weight:  Wt Readings from Last 1 Encounters:  02/27/17 153 lb 4.8 oz (69.5 kg)   Wt Readings from Last 10 Encounters:  02/27/17 153 lb 4.8 oz (69.5 kg)  06/29/14 147 lb 9.6 oz (67 kg)  06/29/14 145 lb (65.8 kg)  11/30/11 149 lb (67.6 kg)  11/30/11 149 lb (67.6 kg)  11/25/11 144 lb (65.3 kg)   Ideal Body Weight:  54.54 kg  BMI:  Body mass index is 26.31 kg/m.  Estimated Nutritional Needs:  Kcal:  1600-1800 (23-26 kcal/kg bw) Protein:  75-90g Pro (1.1-1.3 g/kg bw) Fluid:  1.6-1.8 L fluid (58ml/kcal)  EDUCATION NEEDS:  No education needs identified at this time  Burtis Junes RD, LDN, [facility] Pager: [phone] 02/27/2017 9:48 AM

## 2017-02-27 NOTE — H&P (Signed)
History and Physical    Amber Herman VZD:[id number] DOB: 09-09-1945 DOA: 02/26/2017  PCP: Renee Rival, NP  Patient coming from: home  Chief Complaint:   N/v, recent uti, lbp  HPI: Amber Herman is a 71 y.o. female with medical history significant of afib, htn, dm comes in with over one day of n/v, right flank pain and lower back pain with urinary frequency.  No abdominal pain.  No diarrhea.  She went to see her PCP yesterday and dx with a uti given oral bactrim to take.  She has not been able to hold this down.  She has been feeling awful.  subj fever no chills.  Referred for admission for pyelonephritis.   Review of Systems: As per HPI otherwise 10 point review of systems negative.   Past Medical History:  Diagnosis Date  . Allergic rhinitis   . Anemia   . Atrial fibrillation with rapid ventricular response ([place]) 11/25/2011  . DM (diabetes mellitus), type 2 ([place])   . GERD (gastroesophageal reflux disease)   . Hyperlipidemia   . Hypertension   . Nephrolithiasis   . Transient ischemic attack (TIA)   . Vitamin D deficiency     Past Surgical History:  Procedure Laterality Date  . CHOLECYSTECTOMY       reports that she has never smoked. She has never used smokeless tobacco. She reports that she does not drink alcohol or use drugs.  Allergies  Allergen Reactions  . Angiotensin Receptor Blockers   . Calcium Channel Blockers   . Simvastatin   . Sulfa Antibiotics Itching and Rash    History reviewed. No pertinent family history. no premature CAD  Prior to Admission medications   Medication Sig Start Date End Date Taking? Authorizing Provider  amoxicillin-clavulanate (AUGMENTIN) 875-125 MG tablet Take 1 tablet by mouth 2 (two) times daily. Take 1 tablet twice daily for 7 days. 01/18/17  Yes [provider]  aspirin EC 81 MG tablet Take 1 tablet (81 mg total) by mouth daily. 06/30/14  Yes Kathie Dike, MD  carvedilol (COREG) 25 MG tablet Take 1.5 tablets  (37.5 mg total) by mouth 2 (two) times daily with a meal. Patient taking differently: Take 25 mg by mouth 2 (two) times daily with a meal.  11/26/11  Yes Rexene Alberts, MD  chlorthalidone (HYGROTON) 50 MG tablet Take 50 mg by mouth daily.   Yes [provider]  fluocinonide (LIDEX) 0.05 % external solution Apply 1 application topically at bedtime.  06/20/14  Yes [provider]  metFORMIN (GLUCOPHAGE) 1000 MG tablet Take 1,000 mg by mouth 2 (two) times daily with a meal.   Yes [provider]  NIFEdipine (PROCARDIA-XL/ADALAT CC) 30 MG 24 hr tablet Take 1 tablet by mouth daily. Take 1 tablet by mouth once a day 02/18/17  Yes [provider]  Potassium Chloride Crys ER (KLOR-CON M20 PO) Take 1 tablet by mouth daily.   Yes [provider]  pravastatin (PRAVACHOL) 40 MG tablet Take 40 mg by mouth at bedtime.    Yes [provider]  sulfamethoxazole-trimethoprim (BACTRIM DS,SEPTRA DS) 800-160 MG tablet Take 1 tablet by mouth 2 (two) times daily. Take 1 tablet by mouth twice a day for 10 days.   Yes [provider]  TRESIBA FLEXTOUCH 200 UNIT/ML SOPN Inject 36 Units into the skin at bedtime. 02/10/17  Yes [provider]  Vitamin D, Ergocalciferol, (DRISDOL) 50000 UNITS CAPS Take 50,000 Units by mouth every Monday, Wednesday, and Friday.  Yes [provider]  benazepril (LOTENSIN) 20 MG tablet Take 20 mg by mouth daily. 06/27/14   [provider]  chlorthalidone (HYGROTON) 25 MG tablet Take 25 mg by mouth daily. 06/27/14   [provider]    Physical Exam: Vitals:   02/26/17 2000 02/26/17 2100 02/26/17 2200 02/26/17 2308  BP: (!) 185/60 (!) 177/58 (!) 136/57 (!) 144/58  Pulse: 64 65 66 63  Resp:      Temp:      SpO2: 98% 99% 97% 97%  Weight:      Height:        Constitutional: NAD, calm, comfortable Vitals:   02/26/17 2000 02/26/17 2100 02/26/17 2200 02/26/17 2308  BP: (!) 185/60 (!) 177/58 (!)  136/57 (!) 144/58  Pulse: 64 65 66 63  Resp:      Temp:      SpO2: 98% 99% 97% 97%  Weight:      Height:       Eyes: PERRL, lids and conjunctivae normal ENMT: Mucous membranes are moist. Posterior pharynx clear of any exudate or lesions.Normal dentition.  Neck: normal, supple, no masses, no thyromegaly Respiratory: clear to auscultation bilaterally, no wheezing, no crackles. Normal respiratory effort. No accessory muscle use.  Cardiovascular: Regular rate and rhythm, no murmurs / rubs / gallops. No extremity edema. 2+ pedal pulses. No carotid bruits.  Abdomen: no tenderness, no masses palpated. No hepatosplenomegaly. Bowel sounds positive.  Musculoskeletal: no clubbing / cyanosis. No joint deformity upper and lower extremities. Good ROM, no contractures. Normal muscle tone.  Skin: no rashes, lesions, ulcers. No induration Neurologic: CN 2-12 grossly intact. Sensation intact, DTR normal. Strength 5/5 in all 4.  Psychiatric: Normal judgment and insight. Alert and oriented x 3. Normal mood.    Labs on Admission: I have personally reviewed following labs and imaging studies  CBC:  Recent Labs Lab 02/26/17 1955  WBC 14.7*  NEUTROABS 11.8*  HGB 10.8*  HCT 31.6*  MCV 87.8  PLT 706   Basic Metabolic Panel:  Recent Labs Lab 02/26/17 1955  NA 124*  K 3.4*  CL 88*  CO2 24  GLUCOSE 177*  BUN 15  CREATININE 0.72  CALCIUM 9.0  MG 1.4*   GFR: Estimated Creatinine Clearance: 60.8 mL/min (by C-G formula based on SCr of 0.72 mg/dL). Liver Function Tests:  Recent Labs Lab 02/26/17 1955  AST 15  ALT 12*  ALKPHOS 53  BILITOT 1.0  PROT 7.6  ALBUMIN 3.8   Urine analysis:    Component Value Date/Time   COLORURINE YELLOW 02/26/2017 1958   APPEARANCEUR CLEAR 02/26/2017 1958   LABSPEC 1.012 02/26/2017 1958   PHURINE 6.0 02/26/2017 1958   GLUCOSEU NEGATIVE 02/26/2017 1958   HGBUR NEGATIVE 02/26/2017 1958   BILIRUBINUR NEGATIVE 02/26/2017 1958   KETONESUR 20 (A) 02/26/2017  1958   PROTEINUR NEGATIVE 02/26/2017 1958   UROBILINOGEN 0.2 06/29/2014 1729   NITRITE NEGATIVE 02/26/2017 1958   LEUKOCYTESUR TRACE (A) 02/26/2017 1958   Radiological Exams on Admission: Ct Renal Stone Study  Result Date: 02/26/2017 CLINICAL DATA:  71 y/o F; nausea and vomiting today. Diagnosed with urinary tract infection yesterday. Stone disease suspected. EXAM: CT ABDOMEN AND PELVIS WITHOUT CONTRAST TECHNIQUE: Multidetector CT imaging of the abdomen and pelvis was performed following the standard protocol without IV contrast. COMPARISON:  11/30/2011 CT abdomen and pelvis. FINDINGS: Lower chest: No acute abnormality. Hepatobiliary: No focal liver abnormality is seen. Status post cholecystectomy. No biliary dilatation. Pancreas: Unremarkable. No pancreatic ductal dilatation or  surrounding inflammatory changes. Spleen: Normal in size without focal abnormality. Adrenals/Urinary Tract: Adrenal glands are unremarkable. Kidneys are normal, without renal calculi, focal lesion, or hydronephrosis. Mild bilateral perinephric stranding is increased compared with prior CT of abdomen and pelvis. Bladder is unremarkable. Stomach/Bowel: Stomach is within normal limits. Extensive sigmoid diverticulosis. No evidence of bowel wall thickening, distention, or inflammatory changes. Vascular/Lymphatic: Aortic atherosclerosis. No enlarged abdominal or pelvic lymph nodes. Reproductive: Stable myomatous uterus. Other: No abdominal wall hernia or abnormality. No abdominopelvic ascites. Musculoskeletal: Moderate multilevel degenerative changes of the thoracic and lumbar spine with disc space narrowing, disc bulges, facet arthropathy. No acute fracture identified. IMPRESSION: 1. Mild nonspecific perinephric stranding is increased from prior CT of abdomen and pelvis and may reflect underlying inflammation or infection of the renal collecting systems. No hydronephrosis or nephrolithiasis. Normal bladder. 2. Stable myomatous uterus.  3. Aortic atherosclerosis. 4. Moderate degenerative changes of visible thoracic and lumbar spine. 5. Extensive sigmoid diverticulosis without findings of acute diverticulitis Electronically Signed   By: Kristine Garbe M.D.   On: 02/26/2017 22:38     Assessment/Plan 71 yo female with uti and likely early pyelonephritis  Principal Problem:   Pyelonephritis- iv rocephin.  uc pending.  They do not think PCP did cx yesterday.  Has been on bactrim and  Unable to hold down.  Iv zofran, and iv toradol.   Ivf.    Active Problems:   Hyponatremia- dehydrated, ivf   Nausea & vomiting- zofran   Diabetes mellitus, type II ([place])- place on ssi and hold home meds due to decreased po intake   Hypertension- stable, clarify home meds   Med rec pending   DVT prophylaxis:  scds  Code Status:  full Family Communication:  daughter Disposition Plan:  Per day team Consults called:  none Admission status:   observation   Ajit Errico A MD [facility]  If 7PM-7AM, please contact night-coverage [email or web address] Password [email or web address]  02/27/2017, 12:01 AM

## 2017-02-27 NOTE — Progress Notes (Signed)
PROGRESS NOTE  Amber Herman UXN:[id number] DOB: 07-16-1945 DOA: 02/26/2017 PCP: Renee Rival, NP  Brief History:  71 year old female with a history of atrial fibrillation, central hypertension, diabetes mellitus, TIA, hyperlipidemia presenting with one-day history of right flank pain with associated nausea and vomiting. The patient went to see her primary care provider on 02/25/2017. The patient was given a prescription for Bactrim. The patient took 2 doses after which, the patient began having nausea and vomiting. Because of persistent back pain and associated vomiting, the patient presented for further evaluation. The patient also has endorsed urinary frequency and urgency for the past 2 weeks prior to admission. The patient has subjective fevers and chills. She denied any headache, neck pain, chest pain, shortness breath, coughing, hemoptysis, diarrhea, hematochezia, melena. The patient was started to have WBC 14.7 at the time of admission. She was started on ceftriaxone. CT with renal stone protocol was negative for renal calculi but showed mild bilateral perinephric stranding.  Assessment/Plan: Pyelonephritis -Continue ceftriaxone pending culture data -Continue IV fluids -Advance diet as tolerated  Atrial fibrillation -Presently in sinus rhythm -Previously was on warfarin with CHADSVASc = 6 -Patient states that she follows cardiology in [place], [place] whom has taken her off warfarin -continue ASA  Hyponatremia -Secondary to volume depletion and chlorthalidone -Continue IV fluids  Essential hypertension -Continue carvedilol at lower dose due to soft BP -Holding chlorthalidone secondary to hyponatremia -Holding nifedipine and benazepril secondary to soft blood pressure  Diabetes mellitus type 2 -Holding metformin -NovoLog sliding scale -Start reduced dose Lantus -Patient is on Tresiba 36 units at hs at home  Hyperlipidemia -Continue statin     Disposition  Plan:   Home in 1-2 days  Family Communication:  No Family at bedside--Total time spent 35 minutes.  Greater than 50% spent face to face counseling and coordinating care. 0635 to [place]:  none  Code Status:  FULL  DVT Prophylaxis:  [place] Lovenox   Procedures: As Listed in Progress Note Above  Antibiotics: Ceftriaxone 9/28>>>    Subjective: Patient is feeling better this morning. She still has some right flank pain but states that her vomiting has improved. She denies fevers, chills, chest pain, shortness of breath, nausea, vomiting, diarrhea, abdominal pain. No dysuria or hematuria. Denies any medication or melena.  Objective: Vitals:   02/26/17 2200 02/26/17 2308 02/27/17 0013 02/27/17 0500  BP: (!) 136/57 (!) 144/58 (!) 130/59 (!) 124/53  Pulse: 66 63 (!) 59 75  Resp:   17 18  Temp:   97.9 F (36.6 C) 97.9 F (36.6 C)  TempSrc:   Oral Oral  SpO2: 97% 97% 98% 97%  Weight:   69.1 kg (152 lb 4.8 oz) 69.5 kg (153 lb 4.8 oz)  Height:   5\' 4"  (1.626 m)     Intake/Output Summary (Last 24 hours) at 02/27/17 2542 Last data filed at 02/27/17 0400  Gross per 24 hour  Intake          1411.67 ml  Output                0 ml  Net          1411.67 ml   Weight change:  Exam:   General:  Pt is alert, follows commands appropriately, not in acute distress  HEENT: No icterus, No thrush, No neck mass, [place]/AT  Cardiovascular: RRR, S1/S2, no rubs, no gallops  Respiratory: CTA bilaterally, no wheezing,  no crackles, no rhonchi  Abdomen: Soft/+BS, non tender, non distended, no guarding  Extremities: No edema, No lymphangitis, No petechiae, No rashes, no synovitis   Data Reviewed: I have personally reviewed following labs and imaging studies Basic Metabolic Panel:  Recent Labs Lab 02/26/17 1955 02/27/17 0540  NA 124* 129*  K 3.4* 3.5  CL 88* 94*  CO2 24 27  GLUCOSE 177* 107*  BUN 15 12  CREATININE 0.72 0.76  CALCIUM 9.0 8.3*  MG 1.4*  --    Liver Function  Tests:  Recent Labs Lab 02/26/17 1955  AST 15  ALT 12*  ALKPHOS 53  BILITOT 1.0  PROT 7.6  ALBUMIN 3.8   No results for input(s): LIPASE, AMYLASE in the last 168 hours. No results for input(s): AMMONIA in the last 168 hours. Coagulation Profile: No results for input(s): INR, PROTIME in the last 168 hours. CBC:  Recent Labs Lab 02/26/17 1955 02/27/17 0540  WBC 14.7* 10.0  NEUTROABS 11.8*  --   HGB 10.8* 9.5*  HCT 31.6* 27.7*  MCV 87.8 88.8  PLT 321 296   Cardiac Enzymes: No results for input(s): CKTOTAL, CKMB, CKMBINDEX, TROPONINI in the last 168 hours. BNP: Invalid input(s): POCBNP CBG:  Recent Labs Lab 02/27/17 0008  GLUCAP 158*   HbA1C: No results for input(s): HGBA1C in the last 72 hours. Urine analysis:    Component Value Date/Time   COLORURINE YELLOW 02/26/2017 1958   APPEARANCEUR CLEAR 02/26/2017 1958   LABSPEC 1.012 02/26/2017 1958   PHURINE 6.0 02/26/2017 1958   GLUCOSEU NEGATIVE 02/26/2017 1958   HGBUR NEGATIVE 02/26/2017 [place] NEGATIVE 02/26/2017 1958   KETONESUR 20 (A) 02/26/2017 1958   PROTEINUR NEGATIVE 02/26/2017 1958   UROBILINOGEN 0.2 06/29/2014 1729   NITRITE NEGATIVE 02/26/2017 1958   LEUKOCYTESUR TRACE (A) 02/26/2017 1958   Sepsis Labs: @LABRCNTIP (procalcitonin:4,lacticidven:4) )No results found for this or any previous visit (from the past 240 hour(s)).   Scheduled Meds: . feeding supplement (ENSURE ENLIVE)  237 mL Oral BID BM  . insulin aspart  0-9 Units Subcutaneous TID WC   Continuous Infusions: . 0.9 % NaCl with KCl 40 mEq / L 100 mL/hr (02/27/17 0023)  . cefTRIAXone (ROCEPHIN)  IV      Procedures/Studies: Ct Renal Stone Study  Result Date: 02/26/2017 CLINICAL DATA:  71 y/o F; nausea and vomiting today. Diagnosed with urinary tract infection yesterday. Stone disease suspected. EXAM: CT ABDOMEN AND PELVIS WITHOUT CONTRAST TECHNIQUE: Multidetector CT imaging of the abdomen and pelvis was performed following  the standard protocol without IV contrast. COMPARISON:  11/30/2011 CT abdomen and pelvis. FINDINGS: Lower chest: No acute abnormality. Hepatobiliary: No focal liver abnormality is seen. Status post cholecystectomy. No biliary dilatation. Pancreas: Unremarkable. No pancreatic ductal dilatation or surrounding inflammatory changes. Spleen: Normal in size without focal abnormality. Adrenals/Urinary Tract: Adrenal glands are unremarkable. Kidneys are normal, without renal calculi, focal lesion, or hydronephrosis. Mild bilateral perinephric stranding is increased compared with prior CT of abdomen and pelvis. Bladder is unremarkable. Stomach/Bowel: Stomach is within normal limits. Extensive sigmoid diverticulosis. No evidence of bowel wall thickening, distention, or inflammatory changes. Vascular/Lymphatic: Aortic atherosclerosis. No enlarged abdominal or pelvic lymph nodes. Reproductive: Stable myomatous uterus. Other: No abdominal wall hernia or abnormality. No abdominopelvic ascites. Musculoskeletal: Moderate multilevel degenerative changes of the thoracic and lumbar spine with disc space narrowing, disc bulges, facet arthropathy. No acute fracture identified. IMPRESSION: 1. Mild nonspecific perinephric stranding is increased from prior CT of abdomen and pelvis and may  reflect underlying inflammation or infection of the renal collecting systems. No hydronephrosis or nephrolithiasis. Normal bladder. 2. Stable myomatous uterus. 3. Aortic atherosclerosis. 4. Moderate degenerative changes of visible thoracic and lumbar spine. 5. Extensive sigmoid diverticulosis without findings of acute diverticulitis Electronically Signed   By: Kristine Garbe M.D.   On: 02/26/2017 22:38    Tristan Proto, DO  [facility] Pager [phone]  If 7PM-7AM, please contact night-coverage [email or web address] Password [email or web address] 02/27/2017, 7:02 AM   LOS: 0 days

## 2017-02-28 ENCOUNTER — Inpatient Hospital Stay (HOSPITAL_COMMUNITY): Payer: Medicare Other

## 2017-02-28 ENCOUNTER — Encounter (HOSPITAL_COMMUNITY): Payer: Self-pay | Admitting: Radiology

## 2017-02-28 LAB — GLUCOSE, CAPILLARY
GLUCOSE-CAPILLARY: 134 mg/dL — AB (ref 65–99)
Glucose-Capillary: 155 mg/dL — ABNORMAL HIGH (ref 65–99)
Glucose-Capillary: 144 mg/dL — ABNORMAL HIGH (ref 65–99)
Glucose-Capillary: 179 mg/dL — ABNORMAL HIGH (ref 65–99)

## 2017-02-28 LAB — CBC
HCT: 31.6 % — ABNORMAL LOW (ref 36.0–46.0)
Hemoglobin: 10.4 g/dL — ABNORMAL LOW (ref 12.0–15.0)
MCH: 30.1 pg (ref 26.0–34.0)
MCHC: 32.9 g/dL (ref 30.0–36.0)
MCV: 91.3 fL (ref 78.0–100.0)
PLATELETS: 361 10*3/uL (ref 150–400)
RBC: 3.46 MIL/uL — AB (ref 3.87–5.11)
RDW: 13 % (ref 11.5–15.5)
WBC: 7 10*3/uL (ref 4.0–10.5)

## 2017-02-28 LAB — MAGNESIUM: MAGNESIUM: 1.6 mg/dL — AB (ref 1.7–2.4)

## 2017-02-28 LAB — BASIC METABOLIC PANEL
ANION GAP: 9 (ref 5–15)
BUN: 11 mg/dL (ref 6–20)
CALCIUM: 9.2 mg/dL (ref 8.9–10.3)
CO2: 26 mmol/L (ref 22–32)
CREATININE: 0.74 mg/dL (ref 0.44–1.00)
Chloride: 101 mmol/L (ref 101–111)
Glucose, Bld: 138 mg/dL — ABNORMAL HIGH (ref 65–99)
Potassium: 4.2 mmol/L (ref 3.5–5.1)
SODIUM: 136 mmol/L (ref 135–145)

## 2017-02-28 LAB — URINE CULTURE

## 2017-02-28 MED ORDER — TRAMADOL HCL 50 MG PO TABS: 50.0000 mg | ORAL_TABLET | Freq: Four times a day (QID) | ORAL | 0 refills | Status: DC | PRN

## 2017-02-28 MED ORDER — TRAMADOL HCL 50 MG PO TABS
50.0000 mg | ORAL_TABLET | Freq: Four times a day (QID) | ORAL | Status: DC | PRN
  Administered 2017-02-28 (×2): 50 mg via ORAL
  Filled 2017-02-28 (×2): qty 1

## 2017-02-28 MED ORDER — CARVEDILOL 3.125 MG PO TABS
6.2500 mg | ORAL_TABLET | Freq: Two times a day (BID) | ORAL | Status: DC
  Administered 2017-02-28: 6.25 mg via ORAL
  Filled 2017-02-28: qty 2

## 2017-02-28 MED ORDER — CEFDINIR 300 MG PO CAPS: 300.0000 mg | ORAL_CAPSULE | Freq: Two times a day (BID) | ORAL | 0 refills | Status: DC

## 2017-02-28 MED ORDER — METHOCARBAMOL 500 MG PO TABS
500.0000 mg | ORAL_TABLET | Freq: Three times a day (TID) | ORAL | Status: DC
  Administered 2017-02-28 (×3): 500 mg via ORAL
  Filled 2017-02-28 (×4): qty 1

## 2017-02-28 MED ORDER — METHOCARBAMOL 500 MG PO TABS: 500.0000 mg | ORAL_TABLET | Freq: Three times a day (TID) | ORAL | 0 refills | Status: DC

## 2017-02-28 NOTE — Discharge Summary (Signed)
Physician Discharge Summary  Benicia Bergevin Axis GBT:[id number] DOB: [birth year] DOA: 02/26/2017  PCP: Renee Rival, NP  Admit date: 02/26/2017 Discharge date: 03/01/17  Admitted From: Home Disposition:  Home   Recommendations for Outpatient Follow-up:  1. Follow up with PCP in 1-2 weeks 2. Please obtain BMP/CBC in one week    Discharge Condition: Stable CODE STATUS:FULL Diet recommendation: Heart Healthy    Brief/Interim Summary: 71 year old female with a history of atrial fibrillation, central hypertension, diabetes mellitus, TIA, hyperlipidemia presenting with one-day history of right flank pain with associated nausea and vomiting. The patient went to see her primary care provider on 02/25/2017. The patient was given a prescription for Bactrim. The patient took 2 doses after which, the patient began having nausea and vomiting. Because of persistent back pain and associated vomiting, the patient presented for further evaluation. The patient also has endorsed urinary frequency and urgency for the past 2 weeks prior to admission. The patient has subjective fevers and chills. She denied any headache, neck pain, chest pain, shortness breath, coughing, hemoptysis, diarrhea, hematochezia, melena. The patient was started to have WBC 14.7 at the time of admission. She was started on ceftriaxone. CT with renal stone protocol was negative for renal calculi but showed mild bilateral perinephric stranding.   Discharge Diagnoses:  Pyelonephritis -Continue ceftriaxone pending culture data -urine culture--multiple organisms -home with cefdinir x 7 more days to complete 10 days of therapy -Continued IV fluids -now tolerating diet  Paroxysmal Atrial fibrillation -Presently in sinus rhythm -Previously was on warfarin with CHADSVASc = 6 -Patient states that she follows cardiology in [place], [place] whom has taken her off warfarin -continue ASA  Hyponatremia -Secondary to volume depletion and  chlorthalidone -Continue IV fluids-->improved  Low Back pain -different than "kidney" pain -occasional radicular symptoms to right leg -lumbar xray--Multilevel degenerative disc disease. No acute abnormality seen in the lumbar spine -started robaxin--pt did not like-->made me "feel hot" -start flexeril instead -outpt referral to neurology for EMG  Essential hypertension -Continue carvedilol at lower dose due to soft BP--increased gradually back to home dose -Holding chlorthalidone secondary to hyponatremia initially-->restart -Holding nifedipine and benazepril secondary to soft blood pressure -will not restart nifedipine--pt states she is no longer taking -restart benazepril  Diabetes mellitus type 2 -Holding metformin--restart after d/c -NovoLog sliding scale -Continue reduced dose Lantus -Patient is on Tresiba 36 units at hs at home  Hyperlipidemia -Continue statin  Hypomagnesemia -repleted  Discharge Instructions  Discharge Instructions    Ambulatory referral to Neurology    Complete by:  As directed    Refer to [facility]--Dr. Patel--for EMG for possible right lower extremity radiculopathy Please also place consult  with any of the neurology providers for above   Diet - low sodium heart healthy    Complete by:  As directed    Increase activity slowly    Complete by:  As directed      Allergies as of 03/01/2017      Reactions   Angiotensin Receptor Blockers    Calcium Channel Blockers    Simvastatin    Sulfa Antibiotics Itching, Rash      Medication List    STOP taking these medications   amoxicillin-clavulanate 875-125 MG tablet Commonly known as:  AUGMENTIN   NIFEdipine 30 MG 24 hr tablet Commonly known as:  PROCARDIA-XL/ADALAT CC   sulfamethoxazole-trimethoprim 800-160 MG tablet Commonly known as:  BACTRIM DS,SEPTRA DS     TAKE these medications   aspirin EC 81 MG tablet Take  1 tablet (81 mg total) by mouth daily.   benazepril 20 MG  tablet Commonly known as:  LOTENSIN Take 20 mg by mouth daily.   carvedilol 25 MG tablet Commonly known as:  COREG Take 1 tablet (25 mg total) by mouth 2 (two) times daily with a meal.   cefdinir 300 MG capsule Commonly known as:  OMNICEF Take 1 capsule (300 mg total) by mouth 2 (two) times daily.   chlorthalidone 25 MG tablet Commonly known as:  HYGROTON Take 25 mg by mouth daily. What changed:  Another medication with the same name was removed. Continue taking this medication, and follow the directions you see here.   cyclobenzaprine 5 MG tablet Commonly known as:  FLEXERIL Take 1 tablet (5 mg total) by mouth 3 (three) times daily.   fluocinonide 0.05 % external solution Commonly known as:  LIDEX Apply 1 application topically at bedtime.   KLOR-CON M20 PO Take 1 tablet by mouth daily.   metFORMIN 1000 MG tablet Commonly known as:  GLUCOPHAGE Take 1,000 mg by mouth 2 (two) times daily with a meal.   pravastatin 40 MG tablet Commonly known as:  PRAVACHOL Take 40 mg by mouth at bedtime.   traMADol 50 MG tablet Commonly known as:  ULTRAM Take 1 tablet (50 mg total) by mouth every 6 (six) hours as needed for moderate pain.   TRESIBA FLEXTOUCH 200 UNIT/ML Sopn Generic drug:  Insulin Degludec Inject 36 Units into the skin at bedtime.   Vitamin D (Ergocalciferol) 50000 units Caps capsule Commonly known as:  DRISDOL Take 50,000 Units by mouth every Monday, Wednesday, and Friday.            Discharge Care Instructions        Start     Ordered   03/01/17 0000  Increase activity slowly     03/01/17 3267   03/01/17 0000  Diet - low sodium heart healthy     03/01/17 0937   03/01/17 0000  Ambulatory referral to Neurology    Comments:  Refer to [facility]--Dr. Patel--for EMG for possible right lower extremity radiculopathy Please also place consult  with any of the neurology providers for above   03/01/17 0941   03/01/17 0000  cyclobenzaprine (FLEXERIL) 5 MG  tablet  3 times daily     03/01/17 0944   03/01/17 0000  carvedilol (COREG) 25 MG tablet  2 times daily with meals     03/01/17 0948   02/28/17 0000  traMADol (ULTRAM) 50 MG tablet  Every 6 hours PRN     02/28/17 1746   02/28/17 0000  cefdinir (OMNICEF) 300 MG capsule  2 times daily     02/28/17 1816      Allergies  Allergen Reactions  . Angiotensin Receptor Blockers   . Calcium Channel Blockers   . Simvastatin   . Sulfa Antibiotics Itching and Rash    Consultations:  none   Procedures/Studies: Dg Lumbar Spine 2-3 Views  Result Date: 02/28/2017 CLINICAL DATA:  Low back and right leg pain without known injury. EXAM: LUMBAR SPINE - 2-3 VIEW COMPARISON:  CT scan of February 26, 2017. FINDINGS: No fracture or spondylolisthesis is noted. Mild degenerative disc disease is noted at L1-2, L3-4 and L4-5. Anterior osteophyte formation is noted at multiple levels in the lumbar spine. Atherosclerosis of abdominal aorta is noted. IMPRESSION: Aortic atherosclerosis. Multilevel degenerative disc disease. No acute abnormality seen in the lumbar spine. Electronically Signed   By: Marijo Conception,  M.D.   On: 02/28/2017 16:09   Ct Renal Stone Study  Result Date: 02/26/2017 CLINICAL DATA:  70 y/o F; nausea and vomiting today. Diagnosed with urinary tract infection yesterday. Stone disease suspected. EXAM: CT ABDOMEN AND PELVIS WITHOUT CONTRAST TECHNIQUE: Multidetector CT imaging of the abdomen and pelvis was performed following the standard protocol without IV contrast. COMPARISON:  11/30/2011 CT abdomen and pelvis. FINDINGS: Lower chest: No acute abnormality. Hepatobiliary: No focal liver abnormality is seen. Status post cholecystectomy. No biliary dilatation. Pancreas: Unremarkable. No pancreatic ductal dilatation or surrounding inflammatory changes. Spleen: Normal in size without focal abnormality. Adrenals/Urinary Tract: Adrenal glands are unremarkable. Kidneys are normal, without renal calculi,  focal lesion, or hydronephrosis. Mild bilateral perinephric stranding is increased compared with prior CT of abdomen and pelvis. Bladder is unremarkable. Stomach/Bowel: Stomach is within normal limits. Extensive sigmoid diverticulosis. No evidence of bowel wall thickening, distention, or inflammatory changes. Vascular/Lymphatic: Aortic atherosclerosis. No enlarged abdominal or pelvic lymph nodes. Reproductive: Stable myomatous uterus. Other: No abdominal wall hernia or abnormality. No abdominopelvic ascites. Musculoskeletal: Moderate multilevel degenerative changes of the thoracic and lumbar spine with disc space narrowing, disc bulges, facet arthropathy. No acute fracture identified. IMPRESSION: 1. Mild nonspecific perinephric stranding is increased from prior CT of abdomen and pelvis and may reflect underlying inflammation or infection of the renal collecting systems. No hydronephrosis or nephrolithiasis. Normal bladder. 2. Stable myomatous uterus. 3. Aortic atherosclerosis. 4. Moderate degenerative changes of visible thoracic and lumbar spine. 5. Extensive sigmoid diverticulosis without findings of acute diverticulitis Electronically Signed   By: Kristine Garbe M.D.   On: 02/26/2017 22:38        Discharge Exam: Vitals:   02/28/17 2100 03/01/17 0537  BP: (!) 157/65 (!) 206/56  Pulse: 65 65  Resp:  18  Temp:  98.2 F (36.8 C)  SpO2:  98%   Vitals:   02/28/17 1500 02/28/17 2047 02/28/17 2100 03/01/17 0537  BP: (!) 162/62 (!) 187/59 (!) 157/65 (!) 206/56  Pulse: 68 68 65 65  Resp: 19 18  18   Temp: 98.2 F (36.8 C) 98.5 F (36.9 C)  98.2 F (36.8 C)  TempSrc: Oral Oral  Oral  SpO2: 99% 99%  98%  Weight:    71.3 kg (157 lb 3.2 oz)  Height:        General: Pt is alert, awake, not in acute distress Cardiovascular: RRR, S1/S2 +, no rubs, no gallops Respiratory: CTA bilaterally, no wheezing, no rhonchi Abdominal: Soft, NT, ND, bowel sounds + Extremities: no edema, no  cyanosis   The results of significant diagnostics from this hospitalization (including imaging, microbiology, ancillary and laboratory) are listed below for reference.    Significant Diagnostic Studies: Dg Lumbar Spine 2-3 Views  Result Date: 02/28/2017 CLINICAL DATA:  Low back and right leg pain without known injury. EXAM: LUMBAR SPINE - 2-3 VIEW COMPARISON:  CT scan of February 26, 2017. FINDINGS: No fracture or spondylolisthesis is noted. Mild degenerative disc disease is noted at L1-2, L3-4 and L4-5. Anterior osteophyte formation is noted at multiple levels in the lumbar spine. Atherosclerosis of abdominal aorta is noted. IMPRESSION: Aortic atherosclerosis. Multilevel degenerative disc disease. No acute abnormality seen in the lumbar spine. Electronically Signed   By: Marijo Conception, M.D.   On: 02/28/2017 16:09   Ct Renal Stone Study  Result Date: 02/26/2017 CLINICAL DATA:  71 y/o F; nausea and vomiting today. Diagnosed with urinary tract infection yesterday. Stone disease suspected. EXAM: CT ABDOMEN AND PELVIS WITHOUT CONTRAST  TECHNIQUE: Multidetector CT imaging of the abdomen and pelvis was performed following the standard protocol without IV contrast. COMPARISON:  11/30/2011 CT abdomen and pelvis. FINDINGS: Lower chest: No acute abnormality. Hepatobiliary: No focal liver abnormality is seen. Status post cholecystectomy. No biliary dilatation. Pancreas: Unremarkable. No pancreatic ductal dilatation or surrounding inflammatory changes. Spleen: Normal in size without focal abnormality. Adrenals/Urinary Tract: Adrenal glands are unremarkable. Kidneys are normal, without renal calculi, focal lesion, or hydronephrosis. Mild bilateral perinephric stranding is increased compared with prior CT of abdomen and pelvis. Bladder is unremarkable. Stomach/Bowel: Stomach is within normal limits. Extensive sigmoid diverticulosis. No evidence of bowel wall thickening, distention, or inflammatory changes.  Vascular/Lymphatic: Aortic atherosclerosis. No enlarged abdominal or pelvic lymph nodes. Reproductive: Stable myomatous uterus. Other: No abdominal wall hernia or abnormality. No abdominopelvic ascites. Musculoskeletal: Moderate multilevel degenerative changes of the thoracic and lumbar spine with disc space narrowing, disc bulges, facet arthropathy. No acute fracture identified. IMPRESSION: 1. Mild nonspecific perinephric stranding is increased from prior CT of abdomen and pelvis and may reflect underlying inflammation or infection of the renal collecting systems. No hydronephrosis or nephrolithiasis. Normal bladder. 2. Stable myomatous uterus. 3. Aortic atherosclerosis. 4. Moderate degenerative changes of visible thoracic and lumbar spine. 5. Extensive sigmoid diverticulosis without findings of acute diverticulitis Electronically Signed   By: Kristine Garbe M.D.   On: 02/26/2017 22:38     Microbiology: Recent Results (from the past 240 hour(s))  Urine culture     Status: Abnormal   Collection Time: 02/26/17  7:58 PM  Result Value Ref Range Status   Specimen Description URINE, CLEAN CATCH  Final   Special Requests NONE  Final   Culture MULTIPLE SPECIES PRESENT, SUGGEST RECOLLECTION (A)  Final   Report Status 02/28/2017 FINAL  Final     Labs: Basic Metabolic Panel:  Recent Labs Lab 02/26/17 1955 02/27/17 0540 02/28/17 0626 03/01/17 0605  NA 124* 129* 136 135  K 3.4* 3.5 4.2 3.8  CL 88* 94* 101 97*  CO2 24 27 26 27   GLUCOSE 177* 107* 138* 152*  BUN 15 12 11 12   CREATININE 0.72 0.76 0.74 0.61  CALCIUM 9.0 8.3* 9.2 9.4  MG 1.4*  --  1.6* 1.4*   Liver Function Tests:  Recent Labs Lab 02/26/17 1955  AST 15  ALT 12*  ALKPHOS 53  BILITOT 1.0  PROT 7.6  ALBUMIN 3.8   No results for input(s): LIPASE, AMYLASE in the last 168 hours. No results for input(s): AMMONIA in the last 168 hours. CBC:  Recent Labs Lab 02/26/17 1955 02/27/17 0540 02/28/17 0626  03/01/17 0605  WBC 14.7* 10.0 7.0 7.4  NEUTROABS 11.8*  --   --   --   HGB 10.8* 9.5* 10.4* 10.4*  HCT 31.6* 27.7* 31.6* 31.9*  MCV 87.8 88.8 91.3 92.7  PLT 321 296 361 369   Cardiac Enzymes: No results for input(s): CKTOTAL, CKMB, CKMBINDEX, TROPONINI in the last 168 hours. BNP: Invalid input(s): POCBNP CBG:  Recent Labs Lab 02/28/17 0756 02/28/17 1134 02/28/17 1623 02/28/17 2027 03/01/17 0740  GLUCAP 134* 155* 144* 179* 132*    Time coordinating discharge:  Greater than 30 minutes  Signed:  Evelyn Aguinaldo, DO [facility] Pager: [phone] 03/01/2017, 9:48 AM

## 2017-02-28 NOTE — Progress Notes (Signed)
PROGRESS NOTE  Amber Herman LKG:[id number] DOB: 09/25/1945 DOA: 02/26/2017 PCP: Renee Rival, NP  Brief History:  71 year old female with a history of atrial fibrillation, central hypertension, diabetes mellitus, TIA, hyperlipidemia presenting with one-day history of right flank pain with associated nausea and vomiting. The patient went to see her primary care provider on 02/25/2017. The patient was given a prescription for Bactrim. The patient took 2 doses after which, the patient began having nausea and vomiting. Because of persistent back pain and associated vomiting, the patient presented for further evaluation. The patient also has endorsed urinary frequency and urgency for the past 2 weeks prior to admission. The patient has subjective fevers and chills. She denied any headache, neck pain, chest pain, shortness breath, coughing, hemoptysis, diarrhea, hematochezia, melena. The patient was started to have WBC 14.7 at the time of admission. She was started on ceftriaxone. CT with renal stone protocol was negative for renal calculi but showed mild bilateral perinephric stranding.  Assessment/Plan: Pyelonephritis -Continue ceftriaxone pending culture data -Continue IV fluids -now tolerating diet  Paroxysmal Atrial fibrillation -Presently in sinus rhythm -Previously was on warfarin with CHADSVASc = 6 -Patient states that she follows cardiology in [place], [place] whom has taken her off warfarin -continue ASA  Hyponatremia -Secondary to volume depletion and chlorthalidone -Continue IV fluids-->improved -am BMP  Low Back pain -different than "kidney" pain -occasional radicular symptoms to right leg -lumbar xray -start robaxin  Essential hypertension -Continue carvedilol at lower dose due to soft BP--increase to 6.25 mg -Holding chlorthalidone secondary to hyponatremia -Holding nifedipine and benazepril secondary to soft blood pressure  Diabetes mellitus type  2 -Holding metformin -NovoLog sliding scale -Continue reduced dose Lantus -Patient is on Tresiba 36 units at hs at home  Hyperlipidemia -Continue statin  Hypomagnesemia -repleate   Disposition Plan:   Home 03/01/17 if stable Family Communication:  sister updated at bedside 9/30   Consultants:  none  Code Status:  FULL  DVT Prophylaxis:  [place] Lovenox   Procedures: As Listed in Progress Note Above  Antibiotics: Ceftriaxone 9/28>>>   Subjective: Patient complains of occasional sharp pain on the right lower back radiating down her buttock. She denies any leg weakness. She has not fallen. She denies any fevers, shots, chest pain, shortness breath, nausea, vomiting, diarrhea, abdominal pain. No dysuria or hematuria. No rashes.  Objective: Vitals:   02/27/17 0500 02/27/17 1400 02/27/17 2059 02/28/17 0607  BP: (!) 124/53 (!) 101/53 137/61 (!) 162/56  Pulse: 75 94 64 65  Resp: 18  18 18   Temp: 97.9 F (36.6 C) 98.3 F (36.8 C) 98.3 F (36.8 C) 98 F (36.7 C)  TempSrc: Oral  Oral Oral  SpO2: 97% 95% 99% 99%  Weight: 69.5 kg (153 lb 4.8 oz)   68.2 kg (150 lb 6.4 oz)  Height:        Intake/Output Summary (Last 24 hours) at 02/28/17 1529 Last data filed at 02/28/17 6644  Gross per 24 hour  Intake          1513.75 ml  Output             1250 ml  Net           263.75 ml   Weight change: 1.089 kg (2 lb 6.4 oz) Exam:   General:  Pt is alert, follows commands appropriately, not in acute distress  HEENT: No icterus, No thrush, No neck mass, [place]/AT  Cardiovascular: RRR, S1/S2, no  rubs, no gallops  Respiratory: CTA bilaterally, no wheezing, no crackles, no rhonchi  Abdomen: Soft/+BS, non tender, non distended, no guarding  Extremities: No edema, No lymphangitis, No petechiae, No rashes, no synovitis  Neuro:  CN II-XII intact, strength 4/5 in RUE, RLE, strength 4/5 LUE, LLE; sensation intact bilateral; no dysmetria     Data Reviewed: I have personally  reviewed following labs and imaging studies Basic Metabolic Panel:  Recent Labs Lab 02/26/17 1955 02/27/17 0540 02/28/17 0626  NA 124* 129* 136  K 3.4* 3.5 4.2  CL 88* 94* 101  CO2 24 27 26   GLUCOSE 177* 107* 138*  BUN 15 12 11   CREATININE 0.72 0.76 0.74  CALCIUM 9.0 8.3* 9.2  MG 1.4*  --  1.6*   Liver Function Tests:  Recent Labs Lab 02/26/17 1955  AST 15  ALT 12*  ALKPHOS 53  BILITOT 1.0  PROT 7.6  ALBUMIN 3.8   No results for input(s): LIPASE, AMYLASE in the last 168 hours. No results for input(s): AMMONIA in the last 168 hours. Coagulation Profile: No results for input(s): INR, PROTIME in the last 168 hours. CBC:  Recent Labs Lab 02/26/17 1955 02/27/17 0540 02/28/17 0626  WBC 14.7* 10.0 7.0  NEUTROABS 11.8*  --   --   HGB 10.8* 9.5* 10.4*  HCT 31.6* 27.7* 31.6*  MCV 87.8 88.8 91.3  PLT 321 296 361   Cardiac Enzymes: No results for input(s): CKTOTAL, CKMB, CKMBINDEX, TROPONINI in the last 168 hours. BNP: Invalid input(s): POCBNP CBG:  Recent Labs Lab 02/27/17 1131 02/27/17 1618 02/27/17 2058 02/28/17 0756 02/28/17 1134  GLUCAP 129* 217* 129* 134* 155*   HbA1C: No results for input(s): HGBA1C in the last 72 hours. Urine analysis:    Component Value Date/Time   COLORURINE YELLOW 02/26/2017 1958   APPEARANCEUR CLEAR 02/26/2017 1958   LABSPEC 1.012 02/26/2017 1958   PHURINE 6.0 02/26/2017 1958   GLUCOSEU NEGATIVE 02/26/2017 1958   HGBUR NEGATIVE 02/26/2017 [place] NEGATIVE 02/26/2017 1958   KETONESUR 20 (A) 02/26/2017 1958   PROTEINUR NEGATIVE 02/26/2017 1958   UROBILINOGEN 0.2 06/29/2014 1729   NITRITE NEGATIVE 02/26/2017 1958   LEUKOCYTESUR TRACE (A) 02/26/2017 1958   Sepsis Labs: @LABRCNTIP (procalcitonin:4,lacticidven:4) ) Recent Results (from the past 240 hour(s))  Urine culture     Status: Abnormal   Collection Time: 02/26/17  7:58 PM  Result Value Ref Range Status   Specimen Description URINE, CLEAN CATCH  Final     Special Requests NONE  Final   Culture MULTIPLE SPECIES PRESENT, SUGGEST RECOLLECTION (A)  Final   Report Status 02/28/2017 FINAL  Final     Scheduled Meds: . aspirin  81 mg Oral Daily  . carvedilol  3.125 mg Oral BID WC  . feeding supplement  1 Container Oral BID BM  . insulin aspart  0-5 Units Subcutaneous QHS  . insulin aspart  0-9 Units Subcutaneous TID WC  . insulin glargine  5 Units Subcutaneous QHS  . methocarbamol  500 mg Oral TID  . pravastatin  40 mg Oral q1800   Continuous Infusions: . cefTRIAXone (ROCEPHIN)  IV Stopped (02/27/17 2301)    Procedures/Studies: Ct Renal Stone Study  Result Date: 02/26/2017 CLINICAL DATA:  71 y/o F; nausea and vomiting today. Diagnosed with urinary tract infection yesterday. Stone disease suspected. EXAM: CT ABDOMEN AND PELVIS WITHOUT CONTRAST TECHNIQUE: Multidetector CT imaging of the abdomen and pelvis was performed following the standard protocol without IV contrast. COMPARISON:  11/30/2011 CT abdomen  and pelvis. FINDINGS: Lower chest: No acute abnormality. Hepatobiliary: No focal liver abnormality is seen. Status post cholecystectomy. No biliary dilatation. Pancreas: Unremarkable. No pancreatic ductal dilatation or surrounding inflammatory changes. Spleen: Normal in size without focal abnormality. Adrenals/Urinary Tract: Adrenal glands are unremarkable. Kidneys are normal, without renal calculi, focal lesion, or hydronephrosis. Mild bilateral perinephric stranding is increased compared with prior CT of abdomen and pelvis. Bladder is unremarkable. Stomach/Bowel: Stomach is within normal limits. Extensive sigmoid diverticulosis. No evidence of bowel wall thickening, distention, or inflammatory changes. Vascular/Lymphatic: Aortic atherosclerosis. No enlarged abdominal or pelvic lymph nodes. Reproductive: Stable myomatous uterus. Other: No abdominal wall hernia or abnormality. No abdominopelvic ascites. Musculoskeletal: Moderate multilevel  degenerative changes of the thoracic and lumbar spine with disc space narrowing, disc bulges, facet arthropathy. No acute fracture identified. IMPRESSION: 1. Mild nonspecific perinephric stranding is increased from prior CT of abdomen and pelvis and may reflect underlying inflammation or infection of the renal collecting systems. No hydronephrosis or nephrolithiasis. Normal bladder. 2. Stable myomatous uterus. 3. Aortic atherosclerosis. 4. Moderate degenerative changes of visible thoracic and lumbar spine. 5. Extensive sigmoid diverticulosis without findings of acute diverticulitis Electronically Signed   By: Kristine Garbe M.D.   On: 02/26/2017 22:38    Dorthie Santini, DO  [facility] Pager [phone]  If 7PM-7AM, please contact night-coverage [email or web address] Password [email or web address] 02/28/2017, 3:29 PM   LOS: 1 day

## 2017-03-01 LAB — CBC
HCT: 31.9 % — ABNORMAL LOW (ref 36.0–46.0)
HEMOGLOBIN: 10.4 g/dL — AB (ref 12.0–15.0)
MCH: 30.2 pg (ref 26.0–34.0)
MCHC: 32.6 g/dL (ref 30.0–36.0)
MCV: 92.7 fL (ref 78.0–100.0)
PLATELETS: 369 10*3/uL (ref 150–400)
RBC: 3.44 MIL/uL — AB (ref 3.87–5.11)
RDW: 12.4 % (ref 11.5–15.5)
WBC: 7.4 10*3/uL (ref 4.0–10.5)

## 2017-03-01 LAB — BASIC METABOLIC PANEL
ANION GAP: 11 (ref 5–15)
BUN: 12 mg/dL (ref 6–20)
CHLORIDE: 97 mmol/L — AB (ref 101–111)
CO2: 27 mmol/L (ref 22–32)
Calcium: 9.4 mg/dL (ref 8.9–10.3)
Creatinine, Ser: 0.61 mg/dL (ref 0.44–1.00)
Glucose, Bld: 152 mg/dL — ABNORMAL HIGH (ref 65–99)
POTASSIUM: 3.8 mmol/L (ref 3.5–5.1)
SODIUM: 135 mmol/L (ref 135–145)

## 2017-03-01 LAB — GLUCOSE, CAPILLARY
GLUCOSE-CAPILLARY: 132 mg/dL — AB (ref 65–99)
Glucose-Capillary: 140 mg/dL — ABNORMAL HIGH (ref 65–99)

## 2017-03-01 LAB — VITAMIN B12: VITAMIN B 12: 70 pg/mL — AB (ref 180–914)

## 2017-03-01 LAB — MAGNESIUM: Magnesium: 1.4 mg/dL — ABNORMAL LOW (ref 1.7–2.4)

## 2017-03-01 MED ORDER — CARVEDILOL 25 MG PO TABS
25.0000 mg | ORAL_TABLET | Freq: Two times a day (BID) | ORAL | 0 refills | Status: DC
Start: 2017-03-01 — End: 2019-02-23

## 2017-03-01 MED ORDER — INFLUENZA VAC SPLIT HIGH-DOSE 0.5 ML IM SUSY
0.5000 mL | PREFILLED_SYRINGE | Freq: Once | INTRAMUSCULAR | Status: AC
  Administered 2017-03-01: 0.5 mL via INTRAMUSCULAR

## 2017-03-01 MED ORDER — FLUCONAZOLE 100 MG PO TABS: 150.0000 mg | ORAL_TABLET | Freq: Once | ORAL | Status: DC

## 2017-03-01 MED ORDER — CARVEDILOL 12.5 MG PO TABS
25.0000 mg | ORAL_TABLET | Freq: Two times a day (BID) | ORAL | Status: DC
Start: 2017-03-01 — End: 2017-03-01
  Administered 2017-03-01: 25 mg via ORAL
  Filled 2017-03-01: qty 2

## 2017-03-01 MED ORDER — CYCLOBENZAPRINE HCL 5 MG PO TABS: 5.0000 mg | ORAL_TABLET | Freq: Three times a day (TID) | ORAL | 0 refills | Status: DC

## 2017-03-01 MED ORDER — HYDRALAZINE HCL 25 MG PO TABS
50.0000 mg | ORAL_TABLET | Freq: Three times a day (TID) | ORAL | Status: DC
  Administered 2017-03-01: 50 mg via ORAL
  Filled 2017-03-01: qty 2

## 2017-03-01 MED ORDER — CARVEDILOL 25 MG PO TABS: 25.0000 mg | ORAL_TABLET | Freq: Two times a day (BID) | ORAL | 0 refills | Status: DC

## 2017-03-01 NOTE — Progress Notes (Signed)
Pt discharged home today per Dr. Carles Collet. Pt's IV site D/C'd and WDL. Pt's VSS. Pt provided with home medication list, discharge instructions and prescriptions. Verbalized understanding. Pt left floor via WC in stable condition accompanied by Nursing Supervisor.

## 2017-03-01 NOTE — Care Management Note (Signed)
Case Management Note  Patient Details  Name: KHALIAH BARNICK MRN: [id number] Date of Birth: 1946-05-10  Subjective/Objective:                  Admitted with pyelonephritis. Pt is from home, lives alone and is ind with ADL's. She has PCP, transportation and insurance with drug coverage. She has no difficulty affording or managing medications.   Action/Plan: DC home today with self care. Family at bedside.   Expected Discharge Date:  03/01/17               Expected Discharge Plan:  Home/Self Care  In-House Referral:  NA  Discharge planning Services  CM Consult  Post Acute Care Choice:  NA Choice offered to:  NA  Status of Service:  Completed, signed off  Sherald Barge, RN 03/01/2017, 10:07 AM

## 2017-03-01 NOTE — Care Management Important Message (Signed)
Important Message  Patient Details  Name: Amber Herman MRN: [id number] Date of Birth: Feb 09, 1946   Medicare Important Message Given:  Yes    Sherald Barge, RN 03/01/2017, 10:07 AM

## 2017-03-01 NOTE — Evaluation (Signed)
Physical Therapy Evaluation Patient Details Name: Amber Herman MRN: [id number] DOB: [birth year] Today's Date: 03/01/2017   History of Present Illness  Amber Herman is a 71yo white female who comes to [facility] on 9/29 du e to 1 day N/V Rt flank pain, low back pain, and polyuria. Pt admitted for pyelonephritis. At [place], pt is a fully independent community dwelling adult, works as a Scientist, water quality at [employer].   Clinical Impression  Pt admitted with above diagnosis. Pt currently with functional limitations due to the deficits listed below (see "PT Problem List"). Upon entry, the patient is received semirecumbent in bed, 3 daughters present. Functional mobility assessment demonstrates mild strength impairment globally, but the pt is able to perform bed mobility, transfers, and gait, safely and independently. Pt attests to some mild weakness and low back pain from being int he bed for the last 2 days. No concerns with safety upon return to home. Patient is at minimally impaired from baseline, all education completed, and time is given to address all questions/concerns. No additional skilled PT services needed at this time, PT signing off. PT recommends daily ambulation ad lib or with nursing staff as needed to prevent deconditioning.     Follow Up Recommendations No PT follow up    Equipment Recommendations       Recommendations for Other Services       Precautions / Restrictions Precautions Precautions: None Restrictions Weight Bearing Restrictions: No      Mobility  Bed Mobility Overal bed mobility: Independent                Transfers Overall transfer level: Independent                  Ambulation/Gait Ambulation/Gait assistance: Independent Ambulation Distance (Feet): 350 Feet Assistive device: None Gait Pattern/deviations: WFL(Within Functional Limits) Gait velocity: 0.48m/s      Stairs            Wheelchair Mobility    Modified Rankin (Stroke  Patients Only)       Balance Overall balance assessment: Independent;No apparent balance deficits (not formally assessed)                                           Pertinent Vitals/Pain Pain Assessment: 0-10 Pain Score: 3  Pain Location: Rt flank pain  Pain Intervention(s): Monitored during session;Limited activity within patient's tolerance    Home Living Family/patient expects to be discharged to:: Private residence Living Arrangements: Alone   Type of Home: House         Home Equipment: None      Prior Function Level of Independence: Independent         Comments: works as a Arts administrator.      Hand Dominance        Extremity/Trunk Assessment   Upper Extremity Assessment Upper Extremity Assessment: Overall WFL for tasks assessed    Lower Extremity Assessment Lower Extremity Assessment: Overall WFL for tasks assessed    Cervical / Trunk Assessment Cervical / Trunk Assessment: Kyphotic (mildly kyphotic)  Communication   Communication: No difficulties  Cognition Arousal/Alertness: Awake/alert Behavior During Therapy: WFL for tasks assessed/performed Overall Cognitive Status: Within Functional Limits for tasks assessed  General Comments      Exercises     Assessment/Plan    PT Assessment Patent does not need any further PT services  PT Problem List         PT Treatment Interventions      PT Goals (Current goals can be found in the Care Plan section)  Acute Rehab PT Goals PT Goal Formulation: All assessment and education complete, DC therapy    Frequency     Barriers to discharge        Co-evaluation               AM-PAC PT "6 Clicks" Daily Activity  Outcome Measure Difficulty turning over in bed (including adjusting bedclothes, sheets and blankets)?: None Difficulty moving from lying on back to sitting on the side of the bed? : None Difficulty sitting down  on and standing up from a chair with arms (e.g., wheelchair, bedside commode, etc,.)?: None Help needed moving to and from a bed to chair (including a wheelchair)?: None Help needed walking in hospital room?: None Help needed climbing 3-5 steps with a railing? : A Little 6 Click Score: 23    End of Session   Activity Tolerance: Patient tolerated treatment well Patient left: in bed;with family/visitor present Nurse Communication: Mobility status PT Visit Diagnosis: Muscle weakness (generalized) (M62.81)    Time: 0254-2706 PT Time Calculation (min) (ACUTE ONLY): 9 min   Charges:   PT Evaluation $PT Eval Low Complexity: 1 Low     PT G Codes:        12:39 PM, Mar 27, 2017 Etta Grandchild, PT, DPT Physical Therapist - [place] [phone] (Office)   Abrielle Finck C 27-Mar-2017, 12:37 PM

## 2017-11-02 ENCOUNTER — Encounter (HOSPITAL_COMMUNITY): Payer: Self-pay

## 2017-11-02 ENCOUNTER — Observation Stay (HOSPITAL_BASED_OUTPATIENT_CLINIC_OR_DEPARTMENT_OTHER): Payer: Medicare Other

## 2017-11-02 ENCOUNTER — Emergency Department (HOSPITAL_COMMUNITY): Payer: Medicare Other

## 2017-11-02 ENCOUNTER — Other Ambulatory Visit: Payer: Self-pay

## 2017-11-02 ENCOUNTER — Observation Stay (HOSPITAL_COMMUNITY)
Admission: EM | Admit: 2017-11-02 | Discharge: 2017-11-03 | Disposition: A | Discharge status: Home / Self Care | Payer: Medicare Other | Attending: Internal Medicine | Admitting: Internal Medicine

## 2017-11-02 DIAGNOSIS — Z7901 Long term (current) use of anticoagulants: Secondary | ICD-10-CM | POA: Insufficient documentation

## 2017-11-02 DIAGNOSIS — Z7982 Long term (current) use of aspirin: Secondary | ICD-10-CM | POA: Diagnosis not present

## 2017-11-02 DIAGNOSIS — E782 Mixed hyperlipidemia: Secondary | ICD-10-CM | POA: Insufficient documentation

## 2017-11-02 DIAGNOSIS — E876 Hypokalemia: Secondary | ICD-10-CM | POA: Insufficient documentation

## 2017-11-02 DIAGNOSIS — R002 Palpitations: Secondary | ICD-10-CM | POA: Insufficient documentation

## 2017-11-02 DIAGNOSIS — R748 Abnormal levels of other serum enzymes: Secondary | ICD-10-CM | POA: Diagnosis not present

## 2017-11-02 DIAGNOSIS — I480 Paroxysmal atrial fibrillation: Secondary | ICD-10-CM

## 2017-11-02 DIAGNOSIS — Z8249 Family history of ischemic heart disease and other diseases of the circulatory system: Secondary | ICD-10-CM | POA: Insufficient documentation

## 2017-11-02 DIAGNOSIS — E559 Vitamin D deficiency, unspecified: Secondary | ICD-10-CM | POA: Insufficient documentation

## 2017-11-02 DIAGNOSIS — I10 Essential (primary) hypertension: Secondary | ICD-10-CM | POA: Diagnosis present

## 2017-11-02 DIAGNOSIS — Z888 Allergy status to other drugs, medicaments and biological substances status: Secondary | ICD-10-CM | POA: Insufficient documentation

## 2017-11-02 DIAGNOSIS — E119 Type 2 diabetes mellitus without complications: Secondary | ICD-10-CM | POA: Diagnosis not present

## 2017-11-02 DIAGNOSIS — I4891 Unspecified atrial fibrillation: Secondary | ICD-10-CM | POA: Diagnosis not present

## 2017-11-02 DIAGNOSIS — Z881 Allergy status to other antibiotic agents status: Secondary | ICD-10-CM | POA: Insufficient documentation

## 2017-11-02 DIAGNOSIS — Z794 Long term (current) use of insulin: Secondary | ICD-10-CM | POA: Insufficient documentation

## 2017-11-02 DIAGNOSIS — Z8673 Personal history of transient ischemic attack (TIA), and cerebral infarction without residual deficits: Secondary | ICD-10-CM | POA: Diagnosis not present

## 2017-11-02 DIAGNOSIS — R05 Cough: Secondary | ICD-10-CM | POA: Diagnosis not present

## 2017-11-02 DIAGNOSIS — Z882 Allergy status to sulfonamides status: Secondary | ICD-10-CM | POA: Diagnosis not present

## 2017-11-02 DIAGNOSIS — Z79899 Other long term (current) drug therapy: Secondary | ICD-10-CM | POA: Diagnosis not present

## 2017-11-02 DIAGNOSIS — R0602 Shortness of breath: Secondary | ICD-10-CM | POA: Diagnosis present

## 2017-11-02 DIAGNOSIS — I214 Non-ST elevation (NSTEMI) myocardial infarction: Principal | ICD-10-CM | POA: Diagnosis present

## 2017-11-02 DIAGNOSIS — K219 Gastro-esophageal reflux disease without esophagitis: Secondary | ICD-10-CM | POA: Insufficient documentation

## 2017-11-02 HISTORY — DX: Unspecified atrial fibrillation: I48.91

## 2017-11-02 LAB — URINALYSIS, ROUTINE W REFLEX MICROSCOPIC
BILIRUBIN URINE: NEGATIVE
Glucose, UA: NEGATIVE mg/dL
KETONES UR: NEGATIVE mg/dL
Nitrite: NEGATIVE
PROTEIN: NEGATIVE mg/dL
Specific Gravity, Urine: 1.015 (ref 1.005–1.030)
pH: 6 (ref 5.0–8.0)

## 2017-11-02 LAB — CBC WITH DIFFERENTIAL/PLATELET
BASOS ABS: 0 10*3/uL (ref 0.0–0.1)
BASOS PCT: 0 %
EOS ABS: 0.3 10*3/uL (ref 0.0–0.7)
EOS PCT: 2 %
HCT: 34.9 % — ABNORMAL LOW (ref 36.0–46.0)
HEMOGLOBIN: 11.3 g/dL — AB (ref 12.0–15.0)
LYMPHS ABS: 2.1 10*3/uL (ref 0.7–4.0)
LYMPHS PCT: 17 %
MCH: 29.8 pg (ref 26.0–34.0)
MCHC: 32.4 g/dL (ref 30.0–36.0)
MCV: 92.1 fL (ref 78.0–100.0)
MONOS PCT: 9 %
Monocytes Absolute: 1.1 10*3/uL — ABNORMAL HIGH (ref 0.1–1.0)
Neutro Abs: 9 10*3/uL — ABNORMAL HIGH (ref 1.7–7.7)
Neutrophils Relative %: 72 %
Platelets: 343 10*3/uL (ref 150–400)
RBC: 3.79 MIL/uL — ABNORMAL LOW (ref 3.87–5.11)
RDW: 13.2 % (ref 11.5–15.5)
WBC: 12.5 10*3/uL — ABNORMAL HIGH (ref 4.0–10.5)

## 2017-11-02 LAB — ECHOCARDIOGRAM COMPLETE
Height: 64 in
WEIGHTICAEL: 2380.97 [oz_av]

## 2017-11-02 LAB — COMPREHENSIVE METABOLIC PANEL
ALBUMIN: 4.2 g/dL (ref 3.5–5.0)
ALT: 14 U/L (ref 14–54)
AST: 18 U/L (ref 15–41)
Alkaline Phosphatase: 56 U/L (ref 38–126)
Anion gap: 12 (ref 5–15)
BUN: 18 mg/dL (ref 6–20)
CO2: 26 mmol/L (ref 22–32)
Calcium: 9.7 mg/dL (ref 8.9–10.3)
Chloride: 101 mmol/L (ref 101–111)
Creatinine, Ser: 0.69 mg/dL (ref 0.44–1.00)
GFR calc Af Amer: >60 mL/min (ref >60)
GFR calc non Af Amer: >60 mL/min (ref >60)
GLUCOSE: 147 mg/dL — AB (ref 65–99)
POTASSIUM: 3.6 mmol/L (ref 3.5–5.1)
Sodium: 139 mmol/L (ref 135–145)
TOTAL PROTEIN: 7.9 g/dL (ref 6.5–8.1)
Total Bilirubin: 0.7 mg/dL (ref 0.3–1.2)

## 2017-11-02 LAB — TROPONIN I
TROPONIN I: 0.48 ng/mL — AB (ref <0.03)
Troponin I: 0.41 ng/mL (ref <0.03)
Troponin I: 0.48 ng/mL (ref <0.03)

## 2017-11-02 LAB — GLUCOSE, CAPILLARY
GLUCOSE-CAPILLARY: 150 mg/dL — AB (ref 65–99)
Glucose-Capillary: 148 mg/dL — ABNORMAL HIGH (ref 65–99)

## 2017-11-02 LAB — HEPARIN LEVEL (UNFRACTIONATED): Heparin Unfractionated: <0.1 IU/mL — ABNORMAL LOW (ref 0.30–0.70)

## 2017-11-02 LAB — MRSA PCR SCREENING: MRSA by PCR: POSITIVE — AB

## 2017-11-02 MED ORDER — METOPROLOL TARTRATE 5 MG/5ML IV SOLN
2.5000 mg | Freq: Four times a day (QID) | INTRAVENOUS | Status: DC
  Administered 2017-11-02 (×2): 2.5 mg via INTRAVENOUS
  Filled 2017-11-02 (×2): qty 5

## 2017-11-02 MED ORDER — METOPROLOL TARTRATE 5 MG/5ML IV SOLN
10.0000 mg | Freq: Once | INTRAVENOUS | Status: AC
  Administered 2017-11-02: 10 mg via INTRAVENOUS
  Filled 2017-11-02: qty 10

## 2017-11-02 MED ORDER — ASPIRIN 81 MG PO CHEW
324.0000 mg | CHEWABLE_TABLET | Freq: Once | ORAL | Status: AC
  Administered 2017-11-02: 324 mg via ORAL
  Filled 2017-11-02: qty 4

## 2017-11-02 MED ORDER — ONDANSETRON HCL 4 MG PO TABS: 4.0000 mg | ORAL_TABLET | Freq: Four times a day (QID) | ORAL | Status: DC | PRN

## 2017-11-02 MED ORDER — MUPIROCIN 2 % EX OINT
1.0000 "application " | TOPICAL_OINTMENT | Freq: Two times a day (BID) | CUTANEOUS | Status: DC
  Administered 2017-11-02 – 2017-11-03 (×2): 1 via NASAL
  Filled 2017-11-02: qty 22

## 2017-11-02 MED ORDER — CHLORTHALIDONE 25 MG PO TABS
25.0000 mg | ORAL_TABLET | Freq: Every day | ORAL | Status: DC
  Administered 2017-11-02 – 2017-11-03 (×2): 25 mg via ORAL
  Filled 2017-11-02 (×5): qty 1

## 2017-11-02 MED ORDER — HEPARIN BOLUS VIA INFUSION
4000.0000 [IU] | Freq: Once | INTRAVENOUS | Status: AC
  Administered 2017-11-02: 4000 [IU] via INTRAVENOUS

## 2017-11-02 MED ORDER — INSULIN ASPART 100 UNIT/ML ~~LOC~~ SOLN
0.0000 [IU] | Freq: Three times a day (TID) | SUBCUTANEOUS | Status: DC
  Administered 2017-11-02: 2 [IU] via SUBCUTANEOUS
  Administered 2017-11-03: 3 [IU] via SUBCUTANEOUS

## 2017-11-02 MED ORDER — ASPIRIN EC 81 MG PO TBEC
81.0000 mg | DELAYED_RELEASE_TABLET | Freq: Every day | ORAL | Status: DC
  Administered 2017-11-02 – 2017-11-03 (×2): 81 mg via ORAL
  Filled 2017-11-02 (×2): qty 1

## 2017-11-02 MED ORDER — CHLORHEXIDINE GLUCONATE CLOTH 2 % EX PADS
6.0000 | MEDICATED_PAD | Freq: Every day | CUTANEOUS | Status: DC
  Administered 2017-11-03: 6 via TOPICAL

## 2017-11-02 MED ORDER — CARVEDILOL 12.5 MG PO TABS
25.0000 mg | ORAL_TABLET | Freq: Two times a day (BID) | ORAL | Status: DC
  Administered 2017-11-02 – 2017-11-03 (×3): 25 mg via ORAL
  Filled 2017-11-02 (×3): qty 2

## 2017-11-02 MED ORDER — INSULIN GLARGINE 100 UNIT/ML ~~LOC~~ SOLN
36.0000 [IU] | Freq: Every day | SUBCUTANEOUS | Status: DC
  Administered 2017-11-02: 36 [IU] via SUBCUTANEOUS
  Filled 2017-11-02 (×2): qty 0.36

## 2017-11-02 MED ORDER — HEPARIN (PORCINE) IN NACL 100-0.45 UNIT/ML-% IJ SOLN
12.0000 [IU]/kg/h | INTRAMUSCULAR | Status: DC
  Administered 2017-11-02: 800 [IU]/h via INTRAVENOUS
  Filled 2017-11-02: qty 250

## 2017-11-02 MED ORDER — ACETAMINOPHEN 325 MG PO TABS: 650.0000 mg | ORAL_TABLET | Freq: Four times a day (QID) | ORAL | Status: DC | PRN

## 2017-11-02 MED ORDER — ACETAMINOPHEN 650 MG RE SUPP: 650.0000 mg | Freq: Four times a day (QID) | RECTAL | Status: DC | PRN

## 2017-11-02 MED ORDER — HEPARIN BOLUS VIA INFUSION
2000.0000 [IU] | Freq: Once | INTRAVENOUS | Status: AC
  Administered 2017-11-02: 2000 [IU] via INTRAVENOUS
  Filled 2017-11-02: qty 2000

## 2017-11-02 MED ORDER — INSULIN ASPART 100 UNIT/ML ~~LOC~~ SOLN
0.0000 [IU] | Freq: Every day | SUBCUTANEOUS | Status: DC
Start: 2017-11-02 — End: 2017-11-03

## 2017-11-02 MED ORDER — HEPARIN (PORCINE) IN NACL 100-0.45 UNIT/ML-% IJ SOLN
1100.0000 [IU]/h | INTRAMUSCULAR | Status: DC
  Administered 2017-11-03: 1100 [IU]/h via INTRAVENOUS
  Filled 2017-11-02: qty 250

## 2017-11-02 MED ORDER — ONDANSETRON HCL 4 MG/2ML IJ SOLN: 4.0000 mg | Freq: Four times a day (QID) | INTRAMUSCULAR | Status: DC | PRN

## 2017-11-02 MED ORDER — PRAVASTATIN SODIUM 40 MG PO TABS
40.0000 mg | ORAL_TABLET | Freq: Every day | ORAL | Status: DC
  Administered 2017-11-02: 40 mg via ORAL
  Filled 2017-11-02: qty 1

## 2017-11-02 NOTE — Progress Notes (Signed)
ANTICOAGULATION CONSULT NOTE - Initial Consult  Pharmacy Consult for heparin Indication: chest pain/ACS and atrial fibrillation  Allergies  Allergen Reactions  . Angiotensin Receptor Blockers   . Calcium Channel Blockers   . Simvastatin   . Sulfa Antibiotics Itching and Rash    Patient Measurements: Height: 5\' 4"  (162.6 cm) Weight: 148 lb 13 oz (67.5 kg) IBW/kg (Calculated) : 54.7 HEPARIN DW (KG): 67.5  Vital Signs: Temp: 97.4 F (36.3 C) (06/04 0800) Temp Source: Oral (06/04 0800) BP: 113/49 (06/04 0947) Pulse Rate: 126 (06/04 0947)  Labs: Recent Labs    11/02/17 0804  HGB 11.3*  HCT 34.9*  PLT 343  CREATININE 0.69  TROPONINI 0.41*    Estimated Creatinine Clearance: 60 mL/min (by C-G formula based on SCr of 0.69 mg/dL).   Medical History: Past Medical History:  Diagnosis Date  . Allergic rhinitis   . Anemia   . Atrial fibrillation ([place])    Diagnosed 2013  . DM (diabetes mellitus), type 2 ([place])   . GERD (gastroesophageal reflux disease)   . Hyperlipidemia   . Hypertension   . Nephrolithiasis   . Transient ischemic attack (TIA)   . Vitamin D deficiency     Medications:  Medications Prior to Admission  Medication Sig Dispense Refill Last Dose  . aspirin EC 81 MG tablet Take 1 tablet (81 mg total) by mouth daily. 30 tablet 1 Past Week at Unknown time  . benazepril (LOTENSIN) 20 MG tablet Take 20 mg by mouth daily.   06/29/2014 at Unknown time  . carvedilol (COREG) 25 MG tablet Take 1 tablet (25 mg total) by mouth 2 (two) times daily with a meal. 60 tablet 0   . chlorthalidone (HYGROTON) 25 MG tablet Take 25 mg by mouth daily.   06/29/2014 at Unknown time  . cyclobenzaprine (FLEXERIL) 5 MG tablet Take 1 tablet (5 mg total) by mouth 3 (three) times daily. 21 tablet 0   . fluocinonide (LIDEX) 0.05 % external solution Apply 1 application topically at bedtime.    unknown  . metFORMIN (GLUCOPHAGE) 1000 MG tablet Take 1,000 mg by mouth 2 (two) times daily with a  meal.   Past Week at Unknown time  . Potassium Chloride Crys ER (KLOR-CON M20 PO) Take 1 tablet by mouth daily.   02/25/2017 at Unknown time  . pravastatin (PRAVACHOL) 40 MG tablet Take 40 mg by mouth at bedtime.    Past Week at Unknown time  . traMADol (ULTRAM) 50 MG tablet Take 1 tablet (50 mg total) by mouth every 6 (six) hours as needed for moderate pain. 15 tablet 0   . TRESIBA FLEXTOUCH 200 UNIT/ML SOPN Inject 36 Units into the skin at bedtime.   02/25/2017 at Unknown time  . Vitamin D, Ergocalciferol, (DRISDOL) 50000 UNITS CAPS Take 50,000 Units by mouth every Monday, Wednesday, and Friday.    Past Week at Unknown time    Assessment: 72 yo females presents to ED with SOB, cough, and palpitations. She also has history of afib with RVR and not anticoagulated. Plan to trend troponins and start heparin.  Goal of Therapy:  Heparin level 0.3-0.7 units/ml Monitor platelets by anticoagulation protocol: Yes   Plan:  Give 4000 units bolus x 1, given in ED Start heparin infusion at 800 units/hr Check anti-Xa level in 6-8 hours and daily while on heparin Continue to monitor H&H and platelets  Isac Sarna, BS Vena Austria, BCPS Clinical Pharmacist Pager [phone] 11/02/2017,10:23 AM

## 2017-11-02 NOTE — Progress Notes (Signed)
*  PRELIMINARY RESULTS* Echocardiogram 2D Echocardiogram has been performed.  Amber Herman 11/02/2017, 3:05 PM

## 2017-11-02 NOTE — H&P (Signed)
History and Physical    Amber Herman GEX:[id number] DOB: 30-Jul-1945 DOA: 11/02/2017  PCP: Renee Rival, NP  Patient coming from: Home  I have personally briefly reviewed patient's old medical records in [place]  Chief Complaint: Palpitations  HPI: Amber Herman is a 72 y.o. female with medical history significant of 72 year old female with a history of paroxysmal atrial fibrillation, presents to the hospital with complaints of palpitation and associated shortness of breath.  Current episode started overnight.  She has some chest pressure in the middle of her chest which is described as dull and nonradiating.  This was present until she arrived to the emergency room and received medications for tachycardia.  She has not had any fever.  No vomiting or diarrhea.    ED Course: On arrival to the emergency room she is noted to be in rapid atrial fibrillation.  She was restarted on her home dose of Coreg and was supplemented with IV metoprolol.  Troponin was noted to be mildly elevated at 0.4.  She was seen by cardiology and recommendations were for admission.  She was started on intravenous heparin.  Review of Systems: As per HPI otherwise 10 point review of systems negative.    Past Medical History:  Diagnosis Date  . Allergic rhinitis   . Anemia   . Atrial fibrillation ([place])    Diagnosed 2013  . DM (diabetes mellitus), type 2 ([place])   . GERD (gastroesophageal reflux disease)   . Hyperlipidemia   . Hypertension   . Nephrolithiasis   . Transient ischemic attack (TIA)   . Vitamin D deficiency     Past Surgical History:  Procedure Laterality Date  . CHOLECYSTECTOMY       reports that she has never smoked. She has never used smokeless tobacco. She reports that she does not drink alcohol or use drugs.  Allergies  Allergen Reactions  . Amlodipine   . Angiotensin Receptor Blockers   . Calcium Channel Blockers   . Nicardipine Hcl   . Simvastatin   . Sulfa  Antibiotics Itching and Rash  . Sulfacetamide Sodium Itching and Rash  . Sulfasalazine Itching and Rash    Family History  Problem Relation Age of Onset  . Hypertension Father      Prior to Admission medications   Medication Sig Start Date End Date Taking? Authorizing Provider  aspirin EC 81 MG tablet Take 1 tablet (81 mg total) by mouth daily. 06/30/14  Yes Kathie Dike, MD  carvedilol (COREG) 25 MG tablet Take 1 tablet (25 mg total) by mouth 2 (two) times daily with a meal. 03/01/17  Yes Tat, Shanon Brow, MD  chlorthalidone (HYGROTON) 25 MG tablet Take 25 mg by mouth daily. 06/27/14  Yes [provider]  metFORMIN (GLUCOPHAGE) 1000 MG tablet Take 1,000 mg by mouth 2 (two) times daily with a meal.   Yes [provider]  Potassium Chloride Crys ER (KLOR-CON M20 PO) Take 1 tablet by mouth daily.   Yes [provider]  pravastatin (PRAVACHOL) 40 MG tablet Take 40 mg by mouth at bedtime.    Yes [provider]  TRESIBA FLEXTOUCH 200 UNIT/ML SOPN Inject 36 Units into the skin at bedtime. 02/10/17  Yes [provider]    Physical Exam: Vitals:   11/02/17 0835 11/02/17 0906 11/02/17 0947 11/02/17 1000  BP: 136/89 128/82 (!) 113/49   Pulse: 87 74 (!) 126   Resp: 18 15 16    Temp:  TempSrc:      SpO2: 98% 98% 99%   Weight:    67.5 kg (148 lb 13 oz)  Height:    5\' 4"  (1.626 m)    Constitutional: NAD, calm, comfortable Vitals:   11/02/17 0835 11/02/17 0906 11/02/17 0947 11/02/17 1000  BP: 136/89 128/82 (!) 113/49   Pulse: 87 74 (!) 126   Resp: 18 15 16    Temp:      TempSrc:      SpO2: 98% 98% 99%   Weight:    67.5 kg (148 lb 13 oz)  Height:    5\' 4"  (1.626 m)   Eyes: PERRL, lids and conjunctivae normal ENMT: Mucous membranes are moist. Posterior pharynx clear of any exudate or lesions.Normal dentition.  Neck: normal, supple, no masses, no thyromegaly Respiratory: clear to auscultation bilaterally, no wheezing, no crackles. Normal  respiratory effort. No accessory muscle use.  Cardiovascular: irregular rate and rhythm, no murmurs / rubs / gallops. No extremity edema. 2+ pedal pulses. No carotid bruits.  Abdomen: no tenderness, no masses palpated. No hepatosplenomegaly. Bowel sounds positive.  Musculoskeletal: no clubbing / cyanosis. No joint deformity upper and lower extremities. Good ROM, no contractures. Normal muscle tone.  Skin: no rashes, lesions, ulcers. No induration Neurologic: CN 2-12 grossly intact. Sensation intact, DTR normal. Strength 5/5 in all 4.  Psychiatric: Normal judgment and insight. Alert and oriented x 3. Normal mood.    Labs on Admission: I have personally reviewed following labs and imaging studies  CBC: Recent Labs  Lab 11/02/17 0804  WBC 12.5*  NEUTROABS 9.0*  HGB 11.3*  HCT 34.9*  MCV 92.1  PLT 182   Basic Metabolic Panel: Recent Labs  Lab 11/02/17 0804  NA 139  K 3.6  CL 101  CO2 26  GLUCOSE 147*  BUN 18  CREATININE 0.69  CALCIUM 9.7   GFR: Estimated Creatinine Clearance: 60 mL/min (by C-G formula based on SCr of 0.69 mg/dL). Liver Function Tests: Recent Labs  Lab 11/02/17 0804  AST 18  ALT 14  ALKPHOS 56  BILITOT 0.7  PROT 7.9  ALBUMIN 4.2   No results for input(s): LIPASE, AMYLASE in the last 168 hours. No results for input(s): AMMONIA in the last 168 hours. Coagulation Profile: No results for input(s): INR, PROTIME in the last 168 hours. Cardiac Enzymes: Recent Labs  Lab 11/02/17 0804 11/02/17 1124  TROPONINI 0.41* 0.48*   BNP (last 3 results) No results for input(s): PROBNP in the last 8760 hours. HbA1C: No results for input(s): HGBA1C in the last 72 hours. CBG: No results for input(s): GLUCAP in the last 168 hours. Lipid Profile: No results for input(s): CHOL, HDL, LDLCALC, TRIG, CHOLHDL, LDLDIRECT in the last 72 hours. Thyroid Function Tests: No results for input(s): TSH, T4TOTAL, FREET4, T3FREE, THYROIDAB in the last 72 hours. Anemia  Panel: No results for input(s): VITAMINB12, FOLATE, FERRITIN, TIBC, IRON, RETICCTPCT in the last 72 hours. Urine analysis:    Component Value Date/Time   COLORURINE YELLOW 11/02/2017 [place] 11/02/2017 0814   LABSPEC 1.015 11/02/2017 0814   PHURINE 6.0 11/02/2017 0814   GLUCOSEU NEGATIVE 11/02/2017 0814   HGBUR SMALL (A) 11/02/2017 0814   BILIRUBINUR NEGATIVE 11/02/2017 0814   KETONESUR NEGATIVE 11/02/2017 0814   PROTEINUR NEGATIVE 11/02/2017 0814   UROBILINOGEN 0.2 06/29/2014 1729   NITRITE NEGATIVE 11/02/2017 0814   LEUKOCYTESUR TRACE (A) 11/02/2017 0814    Radiological Exams on Admission: Dg Chest Portable 1 View  Result Date: 11/02/2017  CLINICAL DATA:  Shortness of breath.  Cough. EXAM: PORTABLE CHEST 1 VIEW COMPARISON:  Radiographs of November 30, 2011. FINDINGS: Stable cardiomediastinal silhouette. No pneumothorax or pleural effusion is noted. Both lungs are clear. The visualized skeletal structures are unremarkable. IMPRESSION: No acute cardiopulmonary abnormality seen. Electronically Signed   By: Marijo Conception, M.D.   On: 11/02/2017 08:26    EKG: Independently reviewed. Atrial fibrillation  Assessment/Plan Active Problems:   Atrial fibrillation with rapid ventricular response (HCC)   Diabetes mellitus, type II ([place])   Hypertension   NSTEMI (non-ST elevated myocardial infarction) ([place])     1. Rapid atrial fibrillation.  Currently on Coreg as well as intravenous metoprolol.  Cardiology following.  Continue anticoagulation with heparin. 2. NSTEMI.  Troponin mildly elevated at 0.4.  Continue to trend.  Suspect is related to tachycardia.  If troponins remain flat, plan will be for stress test in a.m.  If they continue to rise, will need to consider cardiac catheterization.  Continue anticoagulation with heparin. 3. Hypertension.  Continue on Coreg.  Blood pressure stable. 4. Diabetes.  Continue on long-acting insulin.  Hold metformin.  Start sliding scale  insulin.  DVT prophylaxis: heparin infusion   Code Status: full code  Family Communication: discussed with family at the bedside  Disposition Plan: discharge home once improved  Consults called: cardiology  Admission status: observation, stepdown   Kathie Dike MD [facility] Pager [phone]  If 7PM-7AM, please contact night-coverage [email or web address] Password [facility]  11/02/2017, 12:47 PM

## 2017-11-02 NOTE — ED Notes (Signed)
CRITICAL VALUE ALERT  Critical Value:  troponin 0.41  Date & Time Notied:  11/02/2017 at [place]  Provider Notified: Dr, Sabra Heck   Orders Received/Actions taken:

## 2017-11-02 NOTE — Progress Notes (Signed)
CRITICAL VALUE ALERT  Critical Value:  Troponin 0.48  Date & Time Notied: 11/02/2017 1202  Provider Notified: Dr.Memon @ 1208  Orders Received/Actions taken: No new orders at this time

## 2017-11-02 NOTE — ED Provider Notes (Signed)
[facility] EMERGENCY DEPARTMENT Provider Note   CSN: [id number] Arrival date & time: 11/02/17  0749     History   Chief Complaint Chief Complaint  Patient presents with  . Shortness of Breath    HPI Amber Herman is a 72 y.o. female.  Pt is 72 y/o female with hx of afib with RVR, she has been having increased SOB, cough and palpitations.  EMR reviewed and shows that she is taking carvedilol and baby ASA.  Sx have been gradually worsening.   She has had cold sx for 4-5 days with nasal congestion, dry cough and sore throat - last night she had palpitations and sternal CP - SOB as well - had some difficulty walking b/c of sob - mild headache.   She has no other c/o including dysuria / diarrhea.    Has been taking no meds - for the illness. Nothing makes better or worse. The history is provided by the patient and a relative.  Shortness of Breath  This is a new problem. The average episode lasts 8 hours. The problem occurs continuously.The current episode started 6 to 12 hours ago. The problem has not changed since onset.Associated symptoms include sore throat and cough. Pertinent negatives include no fever, no headaches, no neck pain, no chest pain, no vomiting, no abdominal pain and no rash. She has tried nothing for the symptoms. Associated medical issues do not include asthma, COPD, chronic lung disease, PE, heart failure, past MI or DVT.    Past Medical History:  Diagnosis Date  . Allergic rhinitis   . Anemia   . Atrial fibrillation ([place])    Diagnosed 2013  . DM (diabetes mellitus), type 2 ([place])   . GERD (gastroesophageal reflux disease)   . Hyperlipidemia   . Hypertension   . Nephrolithiasis   . Transient ischemic attack (TIA)   . Vitamin D deficiency     Patient Active Problem List   Diagnosis Date Noted  . NSTEMI (non-ST elevated myocardial infarction) ([place]) 11/02/2017  . AF (paroxysmal atrial fibrillation) ([place]) 02/27/2017  . Pyelonephritis 02/26/2017  . TIA  (transient ischemic attack) 06/29/2014  . Long term (current) use of anticoagulants 11/30/2011  . Anemia 11/26/2011  . Atrial fibrillation with rapid ventricular response ([place]) 11/25/2011  . Hyponatremia 11/25/2011  . Nausea & vomiting 11/25/2011  . Diarrhea 11/25/2011  . Hypomagnesemia 11/25/2011  . Diabetes mellitus, type II ([place]) 11/25/2011  . Hypertension 11/25/2011    Past Surgical History:  Procedure Laterality Date  . CHOLECYSTECTOMY       OB History   None      Home Medications    Prior to Admission medications   Medication Sig Start Date End Date Taking? Authorizing Provider  aspirin EC 81 MG tablet Take 1 tablet (81 mg total) by mouth daily. 06/30/14   Kathie Dike, MD  benazepril (LOTENSIN) 20 MG tablet Take 20 mg by mouth daily. 06/27/14   [provider]  carvedilol (COREG) 25 MG tablet Take 1 tablet (25 mg total) by mouth 2 (two) times daily with a meal. 03/01/17   Tat, Shanon Brow, MD  chlorthalidone (HYGROTON) 25 MG tablet Take 25 mg by mouth daily. 06/27/14   [provider]  cyclobenzaprine (FLEXERIL) 5 MG tablet Take 1 tablet (5 mg total) by mouth 3 (three) times daily. 03/01/17   Orson Eva, MD  fluocinonide (LIDEX) 0.05 % external solution Apply 1 application topically at bedtime.  06/20/14   [provider]  metFORMIN (GLUCOPHAGE) 1000  MG tablet Take 1,000 mg by mouth 2 (two) times daily with a meal.    [provider]  Potassium Chloride Crys ER (KLOR-CON M20 PO) Take 1 tablet by mouth daily.    [provider]  pravastatin (PRAVACHOL) 40 MG tablet Take 40 mg by mouth at bedtime.     [provider]  traMADol (ULTRAM) 50 MG tablet Take 1 tablet (50 mg total) by mouth every 6 (six) hours as needed for moderate pain. 02/28/17   Orson Eva, MD  TRESIBA FLEXTOUCH 200 UNIT/ML SOPN Inject 36 Units into the skin at bedtime. 02/10/17   [provider]  Vitamin D, Ergocalciferol, (DRISDOL) 50000 UNITS CAPS Take  50,000 Units by mouth every Monday, Wednesday, and Friday.     [provider]    Family History No family history on file.  Social History Social History   Tobacco Use  . Smoking status: Never Smoker  . Smokeless tobacco: Never Used  Substance Use Topics  . Alcohol use: No  . Drug use: No     Allergies   Angiotensin receptor blockers; Calcium channel blockers; Simvastatin; and Sulfa antibiotics   Review of Systems Review of Systems  Constitutional: Negative for chills and fever.  HENT: Positive for congestion and sore throat.   Eyes: Negative for visual disturbance.  Respiratory: Positive for cough and shortness of breath.   Cardiovascular: Positive for palpitations. Negative for chest pain.  Gastrointestinal: Negative for abdominal pain, diarrhea, nausea and vomiting.  Genitourinary: Negative for dysuria and frequency.  Musculoskeletal: Negative for back pain and neck pain.  Skin: Negative for rash.  Neurological: Negative for weakness, numbness and headaches.  Hematological: Negative for adenopathy.  Psychiatric/Behavioral: Negative for behavioral problems.     Physical Exam Updated Vital Signs BP 128/82   Pulse 74   Temp (!) 97.4 F (36.3 C) (Oral)   Resp 15   Ht 5\' 4"  (1.626 m)   Wt 68 kg (150 lb)   SpO2 98%   BMI 25.75 kg/m   Physical Exam  Constitutional: She appears well-developed and well-nourished. No distress.  HENT:  Head: Normocephalic and atraumatic.  Mouth/Throat: Oropharynx is clear and moist. No oropharyngeal exudate.  Eyes: Pupils are equal, round, and reactive to light. Conjunctivae and EOM are normal. Right eye exhibits no discharge. Left eye exhibits no discharge. No scleral icterus.  Neck: Normal range of motion. Neck supple. No JVD present. No thyromegaly present.  Cardiovascular: Normal heart sounds and intact distal pulses. Exam reveals no gallop and no friction rub.  No murmur heard. afib with RVR, pulses at radial  arteries normal  Pulmonary/Chest: Effort normal and breath sounds normal. No respiratory distress. She has no wheezes. She has no rales.  Abdominal: Soft. Bowel sounds are normal. She exhibits no distension and no mass. There is no tenderness.  Musculoskeletal: Normal range of motion. She exhibits no edema or tenderness.       Right lower leg: She exhibits no edema.       Left lower leg: She exhibits no edema.  Lymphadenopathy:    She has cervical adenopathy ( mild).  Neurological: She is alert. Coordination normal.  Skin: Skin is warm and dry. No rash noted. No erythema.  Psychiatric: She has a normal mood and affect. Her behavior is normal.  Nursing note and vitals reviewed.    ED Treatments / Results  Labs (all labs ordered are listed, but only abnormal results are displayed) Labs Reviewed  CBC WITH DIFFERENTIAL/PLATELET -  Abnormal; Notable for the following components:      Result Value   WBC 12.5 (*)    RBC 3.79 (*)    Hemoglobin 11.3 (*)    HCT 34.9 (*)    Neutro Abs 9.0 (*)    Monocytes Absolute 1.1 (*)    All other components within normal limits  COMPREHENSIVE METABOLIC PANEL - Abnormal; Notable for the following components:   Glucose, Bld 147 (*)    All other components within normal limits  TROPONIN I - Abnormal; Notable for the following components:   Troponin I 0.41 (*)    All other components within normal limits  URINALYSIS, ROUTINE W REFLEX MICROSCOPIC    EKG EKG Interpretation  Date/Time:  Tuesday November 02 2017 08:01:20 EDT Ventricular Rate:  128 PR Interval:    QRS Duration: 76 QT Interval:  276 QTC Calculation: 403 R Axis:   67 Text Interpretation:  Atrial fibrillation Repol abnrm suggests ischemia, diffuse leads Baseline wander in lead(s) I II aVR V5 Since last tracing rate faster afib now present Confirmed by Noemi Chapel [phone]) on 11/02/2017 8:04:49 AM   Radiology Dg Chest Portable 1 View  Result Date: 11/02/2017 CLINICAL DATA:  Shortness of  breath.  Cough. EXAM: PORTABLE CHEST 1 VIEW COMPARISON:  Radiographs of November 30, 2011. FINDINGS: Stable cardiomediastinal silhouette. No pneumothorax or pleural effusion is noted. Both lungs are clear. The visualized skeletal structures are unremarkable. IMPRESSION: No acute cardiopulmonary abnormality seen. Electronically Signed   By: Marijo Conception, M.D.   On: 11/02/2017 08:26    Procedures .Critical Care Performed by: Noemi Chapel, MD Authorized by: Noemi Chapel, MD   Critical care provider statement:    Critical care time (minutes):  35   Critical care time was exclusive of:  Separately billable procedures and treating other patients and teaching time   Critical care was necessary to treat or prevent imminent or life-threatening deterioration of the following conditions:  Cardiac failure   Critical care was time spent personally by me on the following activities:  Blood draw for specimens, development of treatment plan with patient or surrogate, discussions with consultants, evaluation of patient's response to treatment, examination of patient, obtaining history from patient or surrogate, ordering and performing treatments and interventions, ordering and review of laboratory studies, ordering and review of radiographic studies, pulse oximetry, re-evaluation of patient's condition and review of old charts   (including critical care time)  Medications Ordered in ED Medications  carvedilol (COREG) tablet 25 mg (25 mg Oral Given 11/02/17 0855)  chlorthalidone (HYGROTON) tablet 25 mg (25 mg Oral Given 11/02/17 0901)  heparin ADULT infusion 100 units/mL (25000 units/272mL sodium chloride 0.45%) (800 Units/hr Intravenous New Bag/Given 11/02/17 0903)  metoprolol tartrate (LOPRESSOR) injection 10 mg (10 mg Intravenous Given 11/02/17 0824)  aspirin chewable tablet 324 mg (324 mg Oral Given 11/02/17 0902)  heparin bolus via infusion 4,000 Units (4,000 Units Intravenous Bolus from Bag 11/02/17 0907)      Initial Impression / Assessment and Plan / ED Course  I have reviewed the triage vital signs and the nursing notes.  Pertinent labs & imaging results that were available during my care of the patient were reviewed by me and considered in my medical decision making (see chart for details).  Clinical Course as of Nov 03 911  Tue Nov 02, 2017  0835 I have personally viewed and interpreted the AP chest x-ray, I find there to be no signs of infiltrate or pneumothorax.  I agree  with the radiology interpretation.  The patient does however have a slight leukocytosis of 12,500, mild anemia.   [BM]  [id number] Medications given for heart rate including beta-blockers.  She does have a calcium channel blocker allergy, will avoid if possible.   [BM]  [id number] Labs reviewed, troponin is elevated at 0.41, renal function is preserved suggesting that she is having ischemia as the cause of her palpitations.  EKG shows nonspecific diffuse ST abnormalities.  Will discuss with cardiology, she will need to be anticoagulated as she is not truly anticoagulated at this time.  The patient is critically ill with what appears to be a non-ST elevation MI with atrial fibrillation and a rapid response.  Rate is somewhat controlled after beta-blocker dose   [BM]    Clinical Course User Index [BM] Noemi Chapel, MD    The pt is in afib with RVR - hx of same - needs acute rate  Control before she decompensates.  She needs xray to r/o pulmonary edema / CHF / pneumonia - labs pending.  Pt is critically ill with a decompensated arrhythmia.  Discussed with the cardiologist who agrees to see the patient in the emergency department, Dr. Domenic Polite  Discussed with the hospitalist who will admit to the stepdown unit.  Final Clinical Impressions(s) / ED Diagnoses   Final diagnoses:  NSTEMI (non-ST elevated myocardial infarction) [facility])  Atrial fibrillation with rapid ventricular response [facility])    ED Discharge Orders    None        Noemi Chapel, MD 11/02/17 [phone]

## 2017-11-02 NOTE — Plan of Care (Signed)
Pt has been up to the bathroom with some SOB which resolves quickly with rest; No c/o pain this shift. Pt using call bell appropriately to ask for assistance. Tolerating diet well.

## 2017-11-02 NOTE — ED Triage Notes (Addendum)
Pt reports woke up around 1am sob and feeling like heart was racing.  Pt says her bp was elevated and her sugar was 228.    Pt says she has had a nonproductive cough and runny nose for the past 4 or 5 days.  Also reports sore throat.   Pt says thinks she has had intermittent fever.

## 2017-11-02 NOTE — Progress Notes (Signed)
[place] for heparin Indication: chest pain/ACS and atrial fibrillation  Allergies  Allergen Reactions  . Amlodipine   . Angiotensin Receptor Blockers   . Calcium Channel Blockers   . Nicardipine Hcl   . Simvastatin   . Sulfa Antibiotics Itching and Rash  . Sulfacetamide Sodium Itching and Rash  . Sulfasalazine Itching and Rash   Patient Measurements: Height: 5\' 4"  (162.6 cm) Weight: 148 lb 13 oz (67.5 kg) IBW/kg (Calculated) : 54.7 HEPARIN DW (KG): 67.5  Vital Signs: Temp: 97.4 F (36.3 C) (06/04 0800) Temp Source: Oral (06/04 0800) BP: 113/49 (06/04 0947) Pulse Rate: 126 (06/04 0947)  Labs: Recent Labs    11/02/17 0804 11/02/17 1124 11/02/17 1640 11/02/17 1746  HGB 11.3*  --   --   --   HCT 34.9*  --   --   --   PLT 343  --   --   --   HEPARINUNFRC  --   --   --  <0.10*  CREATININE 0.69  --   --   --   TROPONINI 0.41* 0.48* 0.48*  --    Estimated Creatinine Clearance: 60 mL/min (by C-G formula based on SCr of 0.69 mg/dL).  Assessment: 72 yo females presented to ED with SOB, cough, and palpitations. She also has history of afib with RVR and not anticoagulated. Plan to trend troponins and start heparin.  Troponin I stable at 0.48.  Initial Heparin level below goal.  No bleeding noted.  Goal of Therapy:  Heparin level 0.3-0.7 units/ml Monitor platelets by anticoagulation protocol: Yes   Plan:  Heparin bolus 2000 units. Increase heparin infusion to 950 units/hr Repeat anti-Xa level in 6-8 hours and daily while on heparin Continue to monitor H&H and platelets  Pricilla Larsson, [facility] 11/02/2017,7:19 PM

## 2017-11-02 NOTE — Consult Note (Signed)
Cardiology Consultation:   Patient ID: Miri Jose Coleman; [id number]; December 14, 1945   Admit date: 11/02/2017 Date of Consult: 11/02/2017  Primary Care Provider: Renee Rival, NP Consulting Cardiologist: Dr. Satira Sark   Patient Profile:   Amber Herman is a 72 y.o. female with a history of paroxysmal atrial fibrillation followed by a cardiologist in [place], hypertension, hyperlipidemia, type 2 diabetes mellitus, and previous TIA who is being seen today for the evaluation of recurrent rapid atrial fibrillation and abnormal troponin I level at the request of Dr. Roderic Palau.  History of Present Illness:   Ms. Burston presents to the ER complaining of onset of rapid palpitations associated with chest tightness and shortness of breath last night.  She states that she had been having a nonproductive cough recently with subjective fevers and sinus drainage, otherwise no major change in her health.  She reports compliance with her regular medications including Coreg.  Details of her cardiac history are not complete.  She was seen by our practice back in 2013 with newly documented atrial fibrillation and initiated on Coumadin at that time with plan to follow-up in the anticoagulation clinic.  Since then she established with a cardiologist in [place] and at some point was taken off anticoagulation for unclear reasons.  She does not recall any bleeding problems.  It sounds like she has not had very significant episodes of breakthrough atrial fibrillation over time.  She does not report any history of ischemic heart disease.  CHADSVASC score is 6 at this point.  She works at a [employer] in [place].  She does not report any regular exertional chest tightness or increasing shortness of breath with typical activities.  Past Medical History:  Diagnosis Date  . Allergic rhinitis   . Anemia   . Atrial fibrillation ([place])    Diagnosed 2013  . DM (diabetes mellitus), type 2 ([place])   .  GERD (gastroesophageal reflux disease)   . Hyperlipidemia   . Hypertension   . Nephrolithiasis   . Transient ischemic attack (TIA)   . Vitamin D deficiency     Past Surgical History:  Procedure Laterality Date  . CHOLECYSTECTOMY       Outpatient Medications:  No current facility-administered medications on file prior to encounter.    Current Outpatient Medications on File Prior to Encounter  Medication Sig Dispense Refill  . aspirin EC 81 MG tablet Take 1 tablet (81 mg total) by mouth daily. 30 tablet 1  . benazepril (LOTENSIN) 20 MG tablet Take 20 mg by mouth daily.    . carvedilol (COREG) 25 MG tablet Take 1 tablet (25 mg total) by mouth 2 (two) times daily with a meal. 60 tablet 0  . chlorthalidone (HYGROTON) 25 MG tablet Take 25 mg by mouth daily.    . cyclobenzaprine (FLEXERIL) 5 MG tablet Take 1 tablet (5 mg total) by mouth 3 (three) times daily. 21 tablet 0  . fluocinonide (LIDEX) 0.05 % external solution Apply 1 application topically at bedtime.     . metFORMIN (GLUCOPHAGE) 1000 MG tablet Take 1,000 mg by mouth 2 (two) times daily with a meal.    . Potassium Chloride Crys ER (KLOR-CON M20 PO) Take 1 tablet by mouth daily.    . pravastatin (PRAVACHOL) 40 MG tablet Take 40 mg by mouth at bedtime.     . traMADol (ULTRAM) 50 MG tablet Take 1 tablet (50 mg total) by mouth every 6 (six) hours as needed for moderate pain. 15 tablet 0  .  TRESIBA FLEXTOUCH 200 UNIT/ML SOPN Inject 36 Units into the skin at bedtime.    . Vitamin D, Ergocalciferol, (DRISDOL) 50000 UNITS CAPS Take 50,000 Units by mouth every Monday, Wednesday, and Friday.       Allergies:    Allergies  Allergen Reactions  . Angiotensin Receptor Blockers   . Calcium Channel Blockers   . Simvastatin   . Sulfa Antibiotics Itching and Rash    Social History:   Social History   Socioeconomic History  . Marital status: Widowed    Spouse name: Not on file  . Number of children: Not on file  . Years of education:  Not on file  . Highest education level: Not on file  Occupational History  . Not on file  Social Needs  . Financial resource strain: Not on file  . Food insecurity:    Worry: Not on file    Inability: Not on file  . Transportation needs:    Medical: Not on file    Non-medical: Not on file  Tobacco Use  . Smoking status: Never Smoker  . Smokeless tobacco: Never Used  Substance and Sexual Activity  . Alcohol use: No  . Drug use: No  . Sexual activity: Not Currently  Lifestyle  . Physical activity:    Days per week: Not on file    Minutes per session: Not on file  . Stress: Not on file  Relationships  . Social connections:    Talks on phone: Not on file    Gets together: Not on file    Attends religious service: Not on file    Active member of club or organization: Not on file    Attends meetings of clubs or organizations: Not on file    Relationship status: Not on file  . Intimate partner violence:    Fear of current or ex partner: Not on file    Emotionally abused: Not on file    Physically abused: Not on file    Forced sexual activity: Not on file  Other Topics Concern  . Not on file  Social History Narrative  . Not on file    Family History:   The patient's family history includes Hypertension in her father.  ROS:  Please see the history of present illness.  All other ROS reviewed and negative.     Physical Exam/Data:   Vitals:   11/02/17 0826 11/02/17 0830 11/02/17 0835 11/02/17 0906  BP: (!) 141/91 (!) 143/69 136/89 128/82  Pulse: (!) 101 (!) 108 87 74  Resp: 20 19 18 15   Temp:      TempSrc:      SpO2: 99% 98% 98% 98%  Weight:      Height:       No intake or output data in the 24 hours ending 11/02/17 0925 Filed Weights   11/02/17 0759  Weight: 150 lb (68 kg)   Body mass index is 25.75 kg/m.   Gen: Patient is in no distress. HEENT: Conjunctiva and lids normal, oropharynx clear. Neck: Supple, no elevated JVP or carotid bruits, no  thyromegaly. Lungs: Clear to auscultation, nonlabored breathing at rest. Cardiac: Irregularly irregular, no S3, soft systolic murmur, no pericardial rub. Abdomen: Soft, nontender, bowel sounds present, no guarding or rebound. Extremities: No pitting edema, distal pulses 2+. Skin: Warm and dry. Musculoskeletal: No kyphosis. Neuropsychiatric: Alert and oriented x3, affect grossly appropriate.  EKG:  I personally reviewed the tracing from 11/02/2017 which shows rapid atrial fibrillation with diffuse ST-T  wave abnormalities, primarily inferolateral leads that are potentially ischemic.  Tracing in 2016 showed sinus rhythm with less prominent ST segment changes.  Telemetry:  I personally reviewed telemetry which shows atrial fibrillation.  Relevant CV Studies:  Echocardiogram 06/30/2014: Study Conclusions  - Left ventricle: The cavity size was normal. Wall thickness was increased in a pattern of moderate LVH. Systolic function was normal. The estimated ejection fraction was in the range of 60% to 65%. Wall motion was normal; there were no regional wall motion abnormalities. Doppler parameters are consistent with abnormal left ventricular relaxation (grade 1 diastolic dysfunction). - Aortic valve: Valve area (Vmax): 2.59 cm^2.  Laboratory Data:  Chemistry Recent Labs  Lab 11/02/17 0804  NA 139  K 3.6  CL 101  CO2 26  GLUCOSE 147*  BUN 18  CREATININE 0.69  CALCIUM 9.7  GFRNONAA >60  GFRAA >60  ANIONGAP 12    Recent Labs  Lab 11/02/17 0804  PROT 7.9  ALBUMIN 4.2  AST 18  ALT 14  ALKPHOS 56  BILITOT 0.7   Hematology Recent Labs  Lab 11/02/17 0804  WBC 12.5*  RBC 3.79*  HGB 11.3*  HCT 34.9*  MCV 92.1  MCH 29.8  MCHC 32.4  RDW 13.2  PLT 343   Cardiac Enzymes Recent Labs  Lab 11/02/17 0804  TROPONINI 0.41*   No results for input(s): TROPIPOC in the last 168 hours.   Radiology/Studies:  Dg Chest Portable 1 View  Result Date: 11/02/2017 CLINICAL  DATA:  Shortness of breath.  Cough. EXAM: PORTABLE CHEST 1 VIEW COMPARISON:  Radiographs of November 30, 2011. FINDINGS: Stable cardiomediastinal silhouette. No pneumothorax or pleural effusion is noted. Both lungs are clear. The visualized skeletal structures are unremarkable. IMPRESSION: No acute cardiopulmonary abnormality seen. Electronically Signed   By: Marijo Conception, M.D.   On: 11/02/2017 08:26    Assessment and Plan:   1.  Atrial fibrillation with RVR, baseline history of paroxysmal atrial fibrillation, onset of symptoms last evening.  She has been following with a cardiologist in [place], records not available at this time.  CHASDSVASC score is 6, however she is not on anticoagulation.  She is on Coreg as an outpatient.  2.  Abnormal troponin I level of 0.41.  She did have chest tightness with onset of palpitations last evening.  No active chest discomfort.  ECG does show potential ischemic ST segment changes in the setting of rapid atrial fibrillation.  She has no reported history of ischemic heart disease or previous myocardial infarction.  Question at this point is whether this represents demand ischemia versus evolving ACS.  3.  Type 2 diabetes mellitus.  She is on metformin as an outpatient.  4.  Essential hypertension, on carvedilol and chlorthalidone as an outpatient.  5.  Mixed hyperlipidemia, on Pravachol as an outpatient.  Situation discussed with the patient and her daughter at bedside.  Plan at this time is admission to the hospitalist service for further evaluation.  She has already been started on IV heparin in the ER. Would continue to cycle cardiac markers to further assess trend, continue telemetry monitoring of her atrial fibrillation control, resume oral Coreg with additional IV Lopressor to be given (she responded well to a dose given in the ER), obtain echocardiogram to follow-up cardiac structure and function.  I did talk with her about indication for long-term oral  anticoagulation, this can be determined once further information is available regarding ischemic assessment (would likely plan on Eliquis).  If her  cardiac enzyme trend is more consistent with demand ischemia and LVEF is normal, we will likely pursue a Myoview study for further ischemic assessment.  If on the other hand cardiac marker trend continues to climb consistent with ACS, cardiac catheterization can be pursued.   Signed, Rozann Lesches, MD  11/02/2017 9:25 AM

## 2017-11-03 ENCOUNTER — Encounter (HOSPITAL_COMMUNITY): Payer: Self-pay

## 2017-11-03 ENCOUNTER — Observation Stay (HOSPITAL_BASED_OUTPATIENT_CLINIC_OR_DEPARTMENT_OTHER): Payer: Medicare Other

## 2017-11-03 DIAGNOSIS — I214 Non-ST elevation (NSTEMI) myocardial infarction: Secondary | ICD-10-CM | POA: Diagnosis not present

## 2017-11-03 DIAGNOSIS — Z8673 Personal history of transient ischemic attack (TIA), and cerebral infarction without residual deficits: Secondary | ICD-10-CM

## 2017-11-03 DIAGNOSIS — I4891 Unspecified atrial fibrillation: Secondary | ICD-10-CM | POA: Diagnosis not present

## 2017-11-03 DIAGNOSIS — I248 Other forms of acute ischemic heart disease: Secondary | ICD-10-CM | POA: Diagnosis not present

## 2017-11-03 DIAGNOSIS — E876 Hypokalemia: Secondary | ICD-10-CM

## 2017-11-03 DIAGNOSIS — D72829 Elevated white blood cell count, unspecified: Secondary | ICD-10-CM | POA: Diagnosis not present

## 2017-11-03 DIAGNOSIS — I480 Paroxysmal atrial fibrillation: Secondary | ICD-10-CM

## 2017-11-03 DIAGNOSIS — I10 Essential (primary) hypertension: Secondary | ICD-10-CM | POA: Diagnosis not present

## 2017-11-03 LAB — NM MYOCAR MULTI W/SPECT W/WALL MOTION / EF
CHL CUP NUCLEAR SSS: 0
CSEPPHR: 102 {beats}/min
LV sys vol: 17 mL
LVDIAVOL: 66 mL (ref 46–106)
RATE: 0.3
Rest HR: 68 {beats}/min
SDS: 0
SRS: 0
TID: 0.96

## 2017-11-03 LAB — BASIC METABOLIC PANEL
Anion gap: 9 (ref 5–15)
BUN: 15 mg/dL (ref 6–20)
CALCIUM: 9.2 mg/dL (ref 8.9–10.3)
CO2: 29 mmol/L (ref 22–32)
Chloride: 100 mmol/L — ABNORMAL LOW (ref 101–111)
Creatinine, Ser: 0.72 mg/dL (ref 0.44–1.00)
GFR calc Af Amer: >60 mL/min (ref >60)
GLUCOSE: 103 mg/dL — AB (ref 65–99)
Potassium: 3.1 mmol/L — ABNORMAL LOW (ref 3.5–5.1)
Sodium: 138 mmol/L (ref 135–145)

## 2017-11-03 LAB — CBC
HCT: 32.3 % — ABNORMAL LOW (ref 36.0–46.0)
Hemoglobin: 10.5 g/dL — ABNORMAL LOW (ref 12.0–15.0)
MCH: 29.7 pg (ref 26.0–34.0)
MCHC: 32.5 g/dL (ref 30.0–36.0)
MCV: 91.2 fL (ref 78.0–100.0)
PLATELETS: 278 10*3/uL (ref 150–400)
RBC: 3.54 MIL/uL — ABNORMAL LOW (ref 3.87–5.11)
RDW: 13.1 % (ref 11.5–15.5)
WBC: 13.1 10*3/uL — ABNORMAL HIGH (ref 4.0–10.5)

## 2017-11-03 LAB — HEPARIN LEVEL (UNFRACTIONATED)
Heparin Unfractionated: 0.15 IU/mL — ABNORMAL LOW (ref 0.30–0.70)
Heparin Unfractionated: 0.25 IU/mL — ABNORMAL LOW (ref 0.30–0.70)

## 2017-11-03 LAB — TSH: TSH: 3.284 u[IU]/mL (ref 0.350–4.500)

## 2017-11-03 LAB — TROPONIN I: TROPONIN I: 0.4 ng/mL — AB (ref <0.03)

## 2017-11-03 LAB — GLUCOSE, CAPILLARY
GLUCOSE-CAPILLARY: 72 mg/dL (ref 65–99)
Glucose-Capillary: 87 mg/dL (ref 65–99)
Glucose-Capillary: 180 mg/dL — ABNORMAL HIGH (ref 65–99)

## 2017-11-03 LAB — MAGNESIUM: MAGNESIUM: 1.5 mg/dL — AB (ref 1.7–2.4)

## 2017-11-03 MED ORDER — MAGNESIUM SULFATE 2 GM/50ML IV SOLN
2.0000 g | Freq: Once | INTRAVENOUS | Status: AC
  Administered 2017-11-03: 2 g via INTRAVENOUS
  Filled 2017-11-03: qty 50

## 2017-11-03 MED ORDER — TECHNETIUM TC 99M TETROFOSMIN IV KIT
30.0000 | PACK | Freq: Once | INTRAVENOUS | Status: AC | PRN
  Administered 2017-11-03: 30 via INTRAVENOUS

## 2017-11-03 MED ORDER — TECHNETIUM TC 99M TETROFOSMIN IV KIT
10.0000 | PACK | Freq: Once | INTRAVENOUS | Status: AC | PRN
  Administered 2017-11-03: 10 via INTRAVENOUS

## 2017-11-03 MED ORDER — POTASSIUM CHLORIDE CRYS ER 20 MEQ PO TBCR
40.0000 meq | EXTENDED_RELEASE_TABLET | Freq: Two times a day (BID) | ORAL | Status: DC
  Administered 2017-11-03: 40 meq via ORAL
  Filled 2017-11-03: qty 2

## 2017-11-03 MED ORDER — APIXABAN 5 MG PO TABS: 5.0000 mg | ORAL_TABLET | Freq: Two times a day (BID) | ORAL | 1 refills | Status: DC

## 2017-11-03 MED ORDER — APIXABAN 5 MG PO TABS
5.0000 mg | ORAL_TABLET | Freq: Two times a day (BID) | ORAL | Status: DC
  Administered 2017-11-03: 5 mg via ORAL
  Filled 2017-11-03: qty 1

## 2017-11-03 MED ORDER — REGADENOSON 0.4 MG/5ML IV SOLN
INTRAVENOUS | Status: AC
  Administered 2017-11-03: 0.4 mg via INTRAVENOUS
  Filled 2017-11-03: qty 5

## 2017-11-03 MED ORDER — SODIUM CHLORIDE 0.9% FLUSH
INTRAVENOUS | Status: AC
  Administered 2017-11-03: 10 mL via INTRAVENOUS
  Filled 2017-11-03: qty 10

## 2017-11-03 NOTE — Care Management Note (Signed)
Case Management Note  Patient Details  Name: JAEL WALDORF MRN: [id number] Date of Birth: 1945/08/08    Expected Discharge Date:   11/03/2017               Expected Discharge Plan:  Home/Self Care  In-House Referral:     Discharge planning Services  CM Consult, Medication Assistance  Post Acute Care Choice:  NA Choice offered to:  NA  DME Arranged:    DME Agency:     HH Arranged:    HH Agency:     Status of Service:  Completed, signed off  If discussed at [employer] of Stay Meetings, dates discussed:    Additional Comments: Patient discharging home today. Eliquis 30 day free coupon given and explained. Eliquis benefits check will be $47 copay. Patient aware.   Zaryiah Barz, Chauncey Reading, RN 11/03/2017, 3:34 PM

## 2017-11-03 NOTE — Discharge Summary (Signed)
Physician Discharge Summary  Amber Herman DDU:[id number] DOB: 1946-02-28 DOA: 11/02/2017  PCP: Renee Rival, NP  Admit date: 11/02/2017  Discharge date: 11/03/2017  Admitted From:Home  Disposition:  Home  Recommendations for Outpatient Follow-up:  1. Follow up with PCP in 1-2 weeks 2. Follow up with Cardiologist in [place] in 2 weeks 3. Follow up BMP/Mg in 1 week  Home Health:N/A  Equipment/Devices:N/A  Discharge Condition:Stable  CODE STATUS: Full  Diet recommendation: Heart Healthy  Brief/Interim Summary:  This is a 72 year old Caucasian female with medical history significant for paroxysmal atrial fibrillation who presented to the hospital yesterday with complaints of palpitations and shortness of breath.  She was noted to be in rapid atrial fibrillation and was restarted on her home dose of Coreg as well as IV metoprolol with conversion back to normal sinus rhythm.  She was noted to have a troponin elevation of 0.4 for which cardiology was consulted to assess for inpatient stress test.  She had remained on IV heparin drip overnight due to concern for possible ACS.  She has undergone cardiac stress testing and is noted to be low risk.  She had borderline low potassium level of 3.1 as well as a magnesium level of 1.5 which were both supplemented.  She denies any further symptomatology and has no dyspnea, chest pain, or palpitations.  She will be discharged on Eliquis 5 mg twice daily prior to discharge.  Discharge Diagnoses:  Active Problems:   Atrial fibrillation with rapid ventricular response (HCC)   Diabetes mellitus, type II ([place])   Hypertension   NSTEMI (non-ST elevated myocardial infarction) ([place])  1. Atrial fibrillation with RVR.  She has converted back to normal sinus rhythm with administration of metoprolol.  She will remain on medications as prescribed at home as well as addition of Eliquis 5 mg twice daily for stroke prophylaxis.  Follow-up with cardiologist in  [place] in the next 2 weeks. 2. Troponin elevation secondary to above.  Trend has been flat with no concern for ACS.  Stress study was performed and was low risk. 3. Hypokalemia/hypomagnesemia. Repleted. Will need follow up BMP in 1 week. 4. Hypertension-stable.  Continue on Coreg. 5. Diabetes.  Continue on long-acting insulin as well as metformin.  Discharge Instructions  Discharge Instructions    Diet - low sodium heart healthy   Complete by:  As directed    Increase activity slowly   Complete by:  As directed      Allergies as of 11/03/2017      Reactions   Amlodipine    Angiotensin Receptor Blockers    Calcium Channel Blockers    Nicardipine Hcl    Simvastatin    Sulfa Antibiotics Itching, Rash   Sulfacetamide Sodium Itching, Rash   Sulfasalazine Itching, Rash      Medication List    TAKE these medications   apixaban 5 MG Tabs tablet Commonly known as:  ELIQUIS Take 1 tablet (5 mg total) by mouth 2 (two) times daily.   aspirin EC 81 MG tablet Take 1 tablet (81 mg total) by mouth daily.   carvedilol 25 MG tablet Commonly known as:  COREG Take 1 tablet (25 mg total) by mouth 2 (two) times daily with a meal.   chlorthalidone 25 MG tablet Commonly known as:  HYGROTON Take 25 mg by mouth daily.   KLOR-CON M20 PO Take 1 tablet by mouth daily.   metFORMIN 1000 MG tablet Commonly known as:  GLUCOPHAGE Take 1,000 mg by mouth 2 (  two) times daily with a meal.   pravastatin 40 MG tablet Commonly known as:  PRAVACHOL Take 40 mg by mouth at bedtime.   TRESIBA FLEXTOUCH 200 UNIT/ML Sopn Generic drug:  Insulin Degludec Inject 36 Units into the skin at bedtime.      Follow-up Information    Renee Rival, NP Follow up in 1 week(s).   Specialty:  Nurse Practitioner Contact information: [address] [place] [id number] [phone]        cardiologist in [place] Follow up in 2 week(s).          Allergies  Allergen Reactions  . Amlodipine    . Angiotensin Receptor Blockers   . Calcium Channel Blockers   . Nicardipine Hcl   . Simvastatin   . Sulfa Antibiotics Itching and Rash  . Sulfacetamide Sodium Itching and Rash  . Sulfasalazine Itching and Rash    Consultations:  Cardiology   Procedures/Studies: Nm Myocar Multi W/spect W/wall Motion / Ef  Result Date: 11/03/2017  Nonspecific ST segment abnormalities seen.  The study is normal. No ischemia or scar.  This is a low risk study.  Nuclear stress EF: 74%.    Dg Chest Portable 1 View  Result Date: 11/02/2017 CLINICAL DATA:  Shortness of breath.  Cough. EXAM: PORTABLE CHEST 1 VIEW COMPARISON:  Radiographs of November 30, 2011. FINDINGS: Stable cardiomediastinal silhouette. No pneumothorax or pleural effusion is noted. Both lungs are clear. The visualized skeletal structures are unremarkable. IMPRESSION: No acute cardiopulmonary abnormality seen. Electronically Signed   By: Marijo Conception, M.D.   On: 11/02/2017 08:26   Discharge Exam: Vitals:   11/03/17 1200 11/03/17 1320  BP:  (!) 162/55  Pulse:  77  Resp:  17  Temp: 98 F (36.7 C)   SpO2:  99%   Vitals:   11/03/17 1000 11/03/17 1100 11/03/17 1200 11/03/17 1320  BP: 129/60 (!) 144/60  (!) 162/55  Pulse: 74 71  77  Resp: 20 (!) 21  17  Temp:   98 F (36.7 C)   TempSrc:   Oral   SpO2: 98% 98%  99%  Weight:      Height:        General: Pt is alert, awake, not in acute distress Cardiovascular: RRR, S1/S2 +, no rubs, no gallops Respiratory: CTA bilaterally, no wheezing, no rhonchi Abdominal: Soft, NT, ND, bowel sounds + Extremities: no edema, no cyanosis    The results of significant diagnostics from this hospitalization (including imaging, microbiology, ancillary and laboratory) are listed below for reference.     Microbiology: Recent Results (from the past 240 hour(s))  MRSA PCR Screening     Status: Abnormal   Collection Time: 11/02/17  9:56 AM  Result Value Ref Range Status   MRSA by PCR POSITIVE  (A) NEGATIVE Final    Comment:        The GeneXpert MRSA Assay (FDA approved for NASAL specimens only), is one component of a comprehensive MRSA colonization surveillance program. It is not intended to diagnose MRSA infection nor to guide or monitor treatment for MRSA infections. RESULT CALLED TO, READ BACK BY AND VERIFIED WITH: CAMPOS,E. AT 1800 ON 11/02/2017 BY BAUGHAM,M. Performed at [facility], [address] [place], [place] [address]      Labs: BNP (last 3 results) No results for input(s): BNP in the last 8760 hours. Basic Metabolic Panel: Recent Labs  Lab 11/02/17 0804 11/03/17 0342  NA 139 138  K 3.6 3.1*  CL 101  100*  CO2 26 29  GLUCOSE 147* 103*  BUN 18 15  CREATININE 0.69 0.72  CALCIUM 9.7 9.2  MG  --  1.5*   Liver Function Tests: Recent Labs  Lab 11/02/17 0804  AST 18  ALT 14  ALKPHOS 56  BILITOT 0.7  PROT 7.9  ALBUMIN 4.2   No results for input(s): LIPASE, AMYLASE in the last 168 hours. No results for input(s): AMMONIA in the last 168 hours. CBC: Recent Labs  Lab 11/02/17 0804 11/03/17 0342  WBC 12.5* 13.1*  NEUTROABS 9.0*  --   HGB 11.3* 10.5*  HCT 34.9* 32.3*  MCV 92.1 91.2  PLT 343 278   Cardiac Enzymes: Recent Labs  Lab 11/02/17 0804 11/02/17 1124 11/02/17 1640 11/02/17 2310  TROPONINI 0.41* 0.48* 0.48* 0.40*   BNP: Invalid input(s): POCBNP CBG: Recent Labs  Lab 11/02/17 1629 11/02/17 2109 11/03/17 0753 11/03/17 1136  GLUCAP 148* 150* 87 72   D-Dimer No results for input(s): DDIMER in the last 72 hours. Hgb A1c No results for input(s): HGBA1C in the last 72 hours. Lipid Profile No results for input(s): CHOL, HDL, LDLCALC, TRIG, CHOLHDL, LDLDIRECT in the last 72 hours. Thyroid function studies Recent Labs    11/02/17 0805  TSH 3.284   Anemia work up No results for input(s): VITAMINB12, FOLATE, FERRITIN, TIBC, IRON, RETICCTPCT in the last 72 hours. Urinalysis    Component Value Date/Time   COLORURINE  YELLOW 11/02/2017 [place] 11/02/2017 0814   LABSPEC 1.015 11/02/2017 0814   PHURINE 6.0 11/02/2017 0814   GLUCOSEU NEGATIVE 11/02/2017 0814   HGBUR SMALL (A) 11/02/2017 0814   BILIRUBINUR NEGATIVE 11/02/2017 0814   KETONESUR NEGATIVE 11/02/2017 0814   PROTEINUR NEGATIVE 11/02/2017 0814   UROBILINOGEN 0.2 06/29/2014 1729   NITRITE NEGATIVE 11/02/2017 0814   LEUKOCYTESUR TRACE (A) 11/02/2017 0814   Sepsis Labs Invalid input(s): PROCALCITONIN,  WBC,  LACTICIDVEN Microbiology Recent Results (from the past 240 hour(s))  MRSA PCR Screening     Status: Abnormal   Collection Time: 11/02/17  9:56 AM  Result Value Ref Range Status   MRSA by PCR POSITIVE (A) NEGATIVE Final    Comment:        The GeneXpert MRSA Assay (FDA approved for NASAL specimens only), is one component of a comprehensive MRSA colonization surveillance program. It is not intended to diagnose MRSA infection nor to guide or monitor treatment for MRSA infections. RESULT CALLED TO, READ BACK BY AND VERIFIED WITH: CAMPOS,E. AT 1800 ON 11/02/2017 BY Elza Rafter. Performed at [facility], [address] [place], [place] [address]      Time coordinating discharge: 35 minutes  SIGNED:   Rodena Goldmann, DO [facility] 11/03/2017, 3:58 PM Pager [phone]  If 7PM-7AM, please contact night-coverage [email or web address] Password [email or web address]

## 2017-11-03 NOTE — Discharge Instructions (Signed)

## 2017-11-03 NOTE — Progress Notes (Addendum)
Progress Note  Patient Name: Amber Herman Date of Encounter: 11/03/2017  Primary Cardiologist: Angelina Sheriff, [place]  Subjective   Converted back to NSR yesterday evening. Reports overall feeling well since and says her chest discomfort has resolved. No recurrent palpitations. Has been NPO since midnight.   Inpatient Medications    Scheduled Meds: . aspirin EC  81 mg Oral Daily  . carvedilol  25 mg Oral BID WC  . Chlorhexidine Gluconate Cloth  6 each Topical Q0600  . chlorthalidone  25 mg Oral Daily  . insulin aspart  0-15 Units Subcutaneous TID WC  . insulin aspart  0-5 Units Subcutaneous QHS  . insulin glargine  36 Units Subcutaneous QHS  . metoprolol tartrate  2.5 mg Intravenous Q6H  . mupirocin ointment  1 application Nasal BID  . potassium chloride  40 mEq Oral BID  . pravastatin  40 mg Oral QHS   Continuous Infusions: . heparin 1,100 Units/hr (11/03/17 0541)   PRN Meds: acetaminophen **OR** acetaminophen, ondansetron **OR** ondansetron (ZOFRAN) IV   Vital Signs    Vitals:   11/03/17 0405 11/03/17 0500 11/03/17 0525 11/03/17 0600  BP: (!) 132/52 (!) 124/53  (!) 121/55  Pulse: 76 75  80  Resp: 20 (!) 22  17  Temp:   98.2 F (36.8 C)   TempSrc:   Oral   SpO2: 97% 97%  98%  Weight:   148 lb 9.4 oz (67.4 kg)   Height:        Intake/Output Summary (Last 24 hours) at 11/03/2017 0915 Last data filed at 11/03/2017 0553 Gross per 24 hour  Intake 279.8 ml  Output -  Net 279.8 ml   Filed Weights   11/02/17 0759 11/02/17 1000 11/03/17 0525  Weight: 150 lb (68 kg) 148 lb 13 oz (67.5 kg) 148 lb 9.4 oz (67.4 kg)    Telemetry    Previously atrial fibrillation. Converted to NSR at 2100 on 11/02/2017 and has maintained NSR since with HR in the 70's to 80's.  - Personally Reviewed  ECG    NSR, HR 72.   - Personally Reviewed  Physical Exam   General: Well developed, well nourished Caucasian female appearing in no acute distress. Head: Normocephalic, atraumatic.  Neck:  Supple without bruits, JVD not elevated. Lungs:  Resp regular and unlabored, CTA without wheezing or rales. Heart: RRR, S1, S2, no S3, S4, or murmur; no rub. Abdomen: Soft, non-tender, non-distended with normoactive bowel sounds. No hepatomegaly. No rebound/guarding. No obvious abdominal masses. Extremities: No clubbing, cyanosis, or lower extremity edema. Distal pedal pulses are 2+ bilaterally. Neuro: Alert and oriented X 3. Moves all extremities spontaneously. Psych: Normal affect.  Labs    Chemistry Recent Labs  Lab 11/02/17 0804 11/03/17 0342  NA 139 138  K 3.6 3.1*  CL 101 100*  CO2 26 29  GLUCOSE 147* 103*  BUN 18 15  CREATININE 0.69 0.72  CALCIUM 9.7 9.2  PROT 7.9  --   ALBUMIN 4.2  --   AST 18  --   ALT 14  --   ALKPHOS 56  --   BILITOT 0.7  --   GFRNONAA >60 >60  GFRAA >60 >60  ANIONGAP 12 9     Hematology Recent Labs  Lab 11/02/17 0804 11/03/17 0342  WBC 12.5* 13.1*  RBC 3.79* 3.54*  HGB 11.3* 10.5*  HCT 34.9* 32.3*  MCV 92.1 91.2  MCH 29.8 29.7  MCHC 32.4 32.5  RDW 13.2 13.1  PLT 343 278  Cardiac Enzymes Recent Labs  Lab 11/02/17 0804 11/02/17 1124 11/02/17 1640 11/02/17 2310  TROPONINI 0.41* 0.48* 0.48* 0.40*   No results for input(s): TROPIPOC in the last 168 hours.   BNPNo results for input(s): BNP, PROBNP in the last 168 hours.   DDimer No results for input(s): DDIMER in the last 168 hours.   Radiology    Dg Chest Portable 1 View  Result Date: 11/02/2017 CLINICAL DATA:  Shortness of breath.  Cough. EXAM: PORTABLE CHEST 1 VIEW COMPARISON:  Radiographs of November 30, 2011. FINDINGS: Stable cardiomediastinal silhouette. No pneumothorax or pleural effusion is noted. Both lungs are clear. The visualized skeletal structures are unremarkable. IMPRESSION: No acute cardiopulmonary abnormality seen. Electronically Signed   By: Marijo Conception, M.D.   On: 11/02/2017 08:26    Cardiac Studies   Echocardiogram: 11/02/2017 Study Conclusions  -  Left ventricle: The cavity size was normal. Wall thickness was   increased in a pattern of mild LVH. Systolic function was normal.   The estimated ejection fraction was in the range of 60% to 65%.   Wall motion was normal; there were no regional wall motion   abnormalities. The study was not technically sufficient to allow   evaluation of LV diastolic dysfunction due to atrial   fibrillation. - Aortic valve: Mildly calcified annulus. - Mitral valve: Mildly calcified annulus. There was trivial   regurgitation. - Left atrium: The atrium was at the upper limits of normal in   size. - Right atrium: Central venous pressure (est): 3 mm Hg. - Atrial septum: No defect or patent foramen ovale was identified. - Tricuspid valve: There was trivial regurgitation. - Pulmonary arteries: PA peak pressure: 36 mm Hg (S). - Pericardium, extracardiac: There was no pericardial effusion.  Patient Profile     72 y.o. female w/ PMH of PAF (not on anticoagulation PTA), HTN, HLD, Type 2 DM, and prior CVA who presented to [facility] ED on 11/02/2017 for evaluation of palpitations and chest discomfort. Found to be in atrial fibrillation with RVR.   Assessment & Plan    1. Atrial Fibrillation with RVR - The patient has a history of paroxysmal atrial fibrillation occurring many years ago. Presented with recurrent palpitations and dyspnea and found to be in atrial fibrillation with RVR. She converted back to NSR yesterday evening and has maintained NSR since.  - K+ at 3.1 this AM (replacement ordered). TSH and Mg not yet checked. Will add-on to AM labs.  - This patients CHA2DS2-VASc Score and unadjusted Ischemic Stroke Rate (% per year) is equal to 9.7 % stroke rate/year from a score of 6 (HTN, DM, Female, Age, prior TIA (2)). She is currently on Heparin. Would anticipate switching to Eliquis 5mg  BID prior to discharge if no significant ischemia is noted on stress testing. Continue PTA Coreg 25mg  BID for rate control.     2. Elevated Troponin  - Cyclic values have been flat at 0.41, 0.48, 0.48, and 0.40. EKG shows no acute ischemic changes.  Echocardiogram shows a preserved EF with no wall motion abnormalities. - She initially really reported chest discomfort in the setting of atrial fibrillation with RVR but upon converting back to normal sinus rhythm her symptoms have resolved. - She has no known history of CAD but has multiple risk factors including HTN, HLD, and Type 2 DM.  She has been NPO since midnight.  Reviewed inpatient versus outpatient stress testing with the patient and she wishes to have this while admitted and  she has been NPO since midnight. Will order a Lexiscan Myoview for later this morning. She has remained on Heparin. If no significant ischemia by stress test, would discontinue and start oral anticoagulation in the setting of her atrial fibrillation.   3. HTN - BP has been variable at 113/48-139/90 since admission. - Continue prior to admission Coreg 25 mg twice daily and Chlorthalidone 25 mg daily.  4. HLD - followed by PCP. On Pravastatin 40mg  daily as an outpatient.    For questions or updates, please contact [place] Please consult [email or web address] for contact info under Cardiology/STEMI.   Signed, Erma Heritage , PA-C 9:15 AM 11/03/2017 Pager: [phone]  The patient was seen and examined, and I agree with the history, physical exam, assessment and plan as documented above.  Briefly, this is a 72 year old woman who has been hospitalized for rapid atrial fibrillation and elevated troponin.  She has since converted to sinus rhythm.  Telemetry overnight demonstrated PACs and PVCs.  She was hypokalemic and hypomagnesemic and these electrolyte's are being repleted.  She has a leukocytosis of uncertain significance.  Chest x-ray and urinalysis were unremarkable.   I personally interpreted her nuclear stress test which was normal with no evidence of myocardial ischemia or scar.   She is being started on apixaban 5 mg twice daily for systemic anticoagulation.  She is on carvedilol 25 mg twice daily for heart rate control.  She has been hypertensive and further antihypertensive medication titration will be deferred to her PCP and cardiologist in [place]. No further recommendations at this time.   Kate Sable, MD, [facility]  11/03/2017 4:03 PM

## 2017-11-03 NOTE — Progress Notes (Signed)
Patient alert and oriented x4. No complaints of pain, shortness of breath, chest pain, dizziness, nausea or vomiting. Patient up out of bed, ambulating in room independently with steady gait. IV hepwell discontinued and removed, pressure held at insertion site. Dressing is currently clean, dry and intact with no noted bleeding. Discharge instructions and medication teaching gone over with patient and patients family (daughter). All questions answered and all expressed full understanding of instructions and education. Pharmacy called by family and confirmed that eliquis prescription was there. Patient discharged home with all belongings. Family driving patient home. Patient expressed full understanding to follow up with cardiologist and PCP.

## 2017-11-03 NOTE — Care Management (Signed)
Benefits check for Eliquis:  1. ELIQUIS 5 MG BID  COVER- YES  CO-PAY- $ 47.00  TIER- 3 DRUG  PRIOR APPROVAL- NO   2. ELIQUIS 2.5 MG BID  COVER- YES  CO-PAY- $ 47.00  TIER- 3 DRUG  PRIOR APPROVAL- NO   PREFERRED PHARMACY: YES - [facility]

## 2017-11-03 NOTE — Care Management Obs Status (Signed)
[facility] NOTIFICATION   Patient Details  Name: WILLARD MADRIGAL MRN: [id number] Date of Birth: Oct 10, 1945   Medicare Observation Status Notification Given:  Yes    Jocilyn Trego, Chauncey Reading, RN 11/03/2017, 12:44 PM

## 2017-11-03 NOTE — Progress Notes (Signed)
[place] for heparin Indication: chest pain/ACS and atrial fibrillation  Allergies  Allergen Reactions  . Amlodipine   . Angiotensin Receptor Blockers   . Calcium Channel Blockers   . Nicardipine Hcl   . Simvastatin   . Sulfa Antibiotics Itching and Rash  . Sulfacetamide Sodium Itching and Rash  . Sulfasalazine Itching and Rash   Patient Measurements: Height: 5\' 4"  (162.6 cm) Weight: 148 lb 9.4 oz (67.4 kg) IBW/kg (Calculated) : 54.7 HEPARIN DW (KG): 67.5  Vital Signs: Temp: 98.2 F (36.8 C) (06/05 0525) Temp Source: Oral (06/05 0525) BP: 121/55 (06/05 0600) Pulse Rate: 80 (06/05 0600)  Labs: Recent Labs    11/02/17 0804 11/02/17 1124 11/02/17 1640 11/02/17 1746 11/02/17 2310 11/03/17 0342  HGB 11.3*  --   --   --   --  10.5*  HCT 34.9*  --   --   --   --  32.3*  PLT 343  --   --   --   --  278  HEPARINUNFRC  --   --   --  <0.10*  --  0.15*  CREATININE 0.69  --   --   --   --  0.72  TROPONINI 0.41* 0.48* 0.48*  --  0.40*  --    Estimated Creatinine Clearance: 60 mL/min (by C-G formula based on SCr of 0.72 mg/dL).  Assessment: 72 yo females presented to ED with SOB, cough, and palpitations. She also has history of afib with RVR and not anticoagulated. Plan to trend troponins and start heparin.  Troponin I stable at 0.40. Heparin level below goal.  No bleeding noted.  Goal of Therapy:  Heparin level 0.3-0.7 units/ml Monitor platelets by anticoagulation protocol: Yes   Plan:  Increase heparin infusion to 1100 units/hr Repeat anti-Xa level in 6-8 hours and daily while on heparin Continue to monitor H&H and platelets  Pricilla Larsson, [facility] 11/03/2017,7:44 AM

## 2017-11-19 ENCOUNTER — Other Ambulatory Visit: Payer: Self-pay

## 2017-11-19 ENCOUNTER — Emergency Department (HOSPITAL_COMMUNITY)
Admission: EM | Admit: 2017-11-19 | Discharge: 2017-11-19 | Disposition: A | Discharge status: Home / Self Care | Payer: Medicare Other | Attending: Emergency Medicine | Admitting: Emergency Medicine

## 2017-11-19 ENCOUNTER — Encounter (HOSPITAL_COMMUNITY): Payer: Self-pay | Admitting: Emergency Medicine

## 2017-11-19 ENCOUNTER — Emergency Department (HOSPITAL_COMMUNITY): Payer: Medicare Other

## 2017-11-19 DIAGNOSIS — Z7984 Long term (current) use of oral hypoglycemic drugs: Secondary | ICD-10-CM | POA: Diagnosis not present

## 2017-11-19 DIAGNOSIS — I252 Old myocardial infarction: Secondary | ICD-10-CM | POA: Insufficient documentation

## 2017-11-19 DIAGNOSIS — E119 Type 2 diabetes mellitus without complications: Secondary | ICD-10-CM | POA: Insufficient documentation

## 2017-11-19 DIAGNOSIS — M1100 Hydroxyapatite deposition disease, unspecified site: Secondary | ICD-10-CM

## 2017-11-19 DIAGNOSIS — Z7982 Long term (current) use of aspirin: Secondary | ICD-10-CM | POA: Diagnosis not present

## 2017-11-19 DIAGNOSIS — I10 Essential (primary) hypertension: Secondary | ICD-10-CM | POA: Diagnosis not present

## 2017-11-19 DIAGNOSIS — Z7901 Long term (current) use of anticoagulants: Secondary | ICD-10-CM | POA: Diagnosis not present

## 2017-11-19 DIAGNOSIS — Z7902 Long term (current) use of antithrombotics/antiplatelets: Secondary | ICD-10-CM | POA: Insufficient documentation

## 2017-11-19 DIAGNOSIS — M25511 Pain in right shoulder: Secondary | ICD-10-CM | POA: Diagnosis present

## 2017-11-19 MED ORDER — IBUPROFEN 400 MG PO TABS
400.0000 mg | ORAL_TABLET | Freq: Once | ORAL | Status: AC
  Administered 2017-11-19: 400 mg via ORAL
  Filled 2017-11-19: qty 1

## 2017-11-19 MED ORDER — ONDANSETRON HCL 4 MG PO TABS: 4.0000 mg | ORAL_TABLET | Freq: Three times a day (TID) | ORAL | 0 refills | Status: DC | PRN

## 2017-11-19 MED ORDER — ACETAMINOPHEN 325 MG PO TABS: 650.0000 mg | ORAL_TABLET | Freq: Three times a day (TID) | ORAL | 0 refills | Status: AC | PRN

## 2017-11-19 MED ORDER — PREDNISONE 50 MG PO TABS
60.0000 mg | ORAL_TABLET | Freq: Once | ORAL | Status: AC
  Administered 2017-11-19: 60 mg via ORAL
  Filled 2017-11-19: qty 1

## 2017-11-19 MED ORDER — HYDROCODONE-ACETAMINOPHEN 5-325 MG PO TABS: 0.5000 | ORAL_TABLET | ORAL | 0 refills | Status: DC | PRN

## 2017-11-19 MED ORDER — ONDANSETRON 4 MG PO TBDP
4.0000 mg | ORAL_TABLET | Freq: Once | ORAL | Status: AC
  Administered 2017-11-19: 4 mg via ORAL
  Filled 2017-11-19: qty 1

## 2017-11-19 MED ORDER — ACETAMINOPHEN 325 MG PO TABS
650.0000 mg | ORAL_TABLET | Freq: Once | ORAL | Status: AC
  Administered 2017-11-19: 650 mg via ORAL
  Filled 2017-11-19: qty 2

## 2017-11-19 MED ORDER — HYDROCODONE-ACETAMINOPHEN 5-325 MG PO TABS
0.5000 | ORAL_TABLET | Freq: Once | ORAL | Status: AC
  Administered 2017-11-19: 0.5 via ORAL
  Filled 2017-11-19: qty 1

## 2017-11-19 MED ORDER — METHOCARBAMOL 500 MG PO TABS
500.0000 mg | ORAL_TABLET | Freq: Once | ORAL | Status: AC
  Administered 2017-11-19: 500 mg via ORAL
  Filled 2017-11-19: qty 1

## 2017-11-19 MED ORDER — PREDNISONE 20 MG PO TABS: ORAL_TABLET | ORAL | 0 refills | Status: DC

## 2017-11-19 NOTE — ED Notes (Signed)
Message left for follow up with case management

## 2017-11-19 NOTE — ED Provider Notes (Signed)
[facility] EMERGENCY DEPARTMENT Provider Note   CSN: [id number] Arrival date & time: 11/19/17  1540     History   Chief Complaint Chief Complaint  Patient presents with  . Shoulder Pain    HPI Amber Herman is a 72 y.o. female.  The history is provided by the patient.  Shoulder Pain   This is a new problem. The current episode started 6 to 12 hours ago. The problem occurs constantly. The problem has been gradually improving. The pain is present in the right shoulder. The quality of the pain is described as aching and sharp. The pain is mild. Associated symptoms include limited range of motion. She has tried heat for the symptoms. The treatment provided moderate relief. There has been no history of extremity trauma. Family history is significant for no rheumatoid arthritis and no gout.    Past Medical History:  Diagnosis Date  . Allergic rhinitis   . Anemia   . Atrial fibrillation ([place])    Diagnosed 2013  . DM (diabetes mellitus), type 2 ([place])   . GERD (gastroesophageal reflux disease)   . Hyperlipidemia   . Hypertension   . Nephrolithiasis   . Transient ischemic attack (TIA)   . Vitamin D deficiency     Patient Active Problem List   Diagnosis Date Noted  . NSTEMI (non-ST elevated myocardial infarction) ([place]) 11/02/2017  . AF (paroxysmal atrial fibrillation) ([place]) 02/27/2017  . Pyelonephritis 02/26/2017  . TIA (transient ischemic attack) 06/29/2014  . Long term (current) use of anticoagulants 11/30/2011  . Anemia 11/26/2011  . Atrial fibrillation with rapid ventricular response ([place]) 11/25/2011  . Hyponatremia 11/25/2011  . Nausea & vomiting 11/25/2011  . Diarrhea 11/25/2011  . Hypomagnesemia 11/25/2011  . Diabetes mellitus, type II ([place]) 11/25/2011  . Hypertension 11/25/2011    Past Surgical History:  Procedure Laterality Date  . CHOLECYSTECTOMY       OB History   None      Home Medications    Prior to Admission medications   Medication Sig  Start Date End Date Taking? Authorizing Provider  apixaban (ELIQUIS) 5 MG TABS tablet Take 1 tablet (5 mg total) by mouth 2 (two) times daily. 11/03/17  Yes Manuella Ghazi, Pratik D, DO  aspirin EC 81 MG tablet Take 1 tablet (81 mg total) by mouth daily. 06/30/14  Yes Kathie Dike, MD  carvedilol (COREG) 25 MG tablet Take 1 tablet (25 mg total) by mouth 2 (two) times daily with a meal. 03/01/17  Yes Tat, Shanon Brow, MD  chlorthalidone (HYGROTON) 50 MG tablet Take 50 mg by mouth daily.  06/27/14  Yes [provider]  metFORMIN (GLUCOPHAGE) 1000 MG tablet Take 1,000 mg by mouth 2 (two) times daily with a meal.   Yes [provider]  Potassium Chloride Crys ER (KLOR-CON M20 PO) Take 1 tablet by mouth daily.   Yes [provider]  pravastatin (PRAVACHOL) 40 MG tablet Take 40 mg by mouth at bedtime.    Yes [provider]  TRESIBA FLEXTOUCH 200 UNIT/ML SOPN Inject 24 Units into the skin at bedtime.  02/10/17  Yes [provider]  acetaminophen (TYLENOL) 325 MG tablet Take 2 tablets (650 mg total) by mouth every 8 (eight) hours as needed. 11/19/17   Tereso Unangst, Corene Cornea, MD  HYDROcodone-acetaminophen (NORCO/VICODIN) 5-325 MG tablet Take 0.5-1 tablets by mouth every 4 (four) hours as needed for severe pain. 11/19/17   Obe Ahlers, Corene Cornea, MD  ondansetron (ZOFRAN) 4 MG tablet Take 1 tablet (4 mg total)  by mouth every 8 (eight) hours as needed for nausea or vomiting. 11/19/17   Kalab Camps, Corene Cornea, MD  predniSONE (DELTASONE) 20 MG tablet 3 tabs po daily x 3 days, then 2 tabs x 3 days, then 1.5 tabs x 3 days, then 1 tab x 3 days, then 0.5 tabs x 3 days 11/19/17   Jolyne Laye, Corene Cornea, MD    Family History Family History  Problem Relation Age of Onset  . Hypertension Father     Social History Social History   Tobacco Use  . Smoking status: Never Smoker  . Smokeless tobacco: Never Used  Substance Use Topics  . Alcohol use: No  . Drug use: No     Allergies   Amlodipine; Angiotensin receptor  blockers; Calcium channel blockers; Nicardipine hcl; Simvastatin; Sulfa antibiotics; Sulfacetamide sodium; and Sulfasalazine   Review of Systems Review of Systems  All other systems reviewed and are negative.    Physical Exam Updated Vital Signs BP (!) 155/57   Pulse 65   Temp 99.2 F (37.3 C) (Oral)   Ht 5\' 4"  (1.626 m)   Wt 67.1 kg (148 lb)   SpO2 96%   BMI 25.40 kg/m   Physical Exam  Constitutional: She is oriented to person, place, and time. She appears well-developed and well-nourished.  HENT:  Head: Normocephalic and atraumatic.  Eyes: Conjunctivae and EOM are normal.  Neck: Normal range of motion.  Cardiovascular: Normal rate and regular rhythm.  Pulmonary/Chest: No stridor. No respiratory distress.  Abdominal: Soft. She exhibits no distension.  Musculoskeletal: She exhibits tenderness (anterior shoulder). She exhibits no deformity.  Decreased ROM 2/2 pain  Neurological: She is alert and oriented to person, place, and time. No cranial nerve deficit. Coordination normal.  Skin: Skin is warm and dry.  Nursing note and vitals reviewed.   ED Treatments / Results  Labs (all labs ordered are listed, but only abnormal results are displayed) Labs Reviewed - No data to display  EKG None  Radiology Dg Shoulder Right  Result Date: 11/19/2017 CLINICAL DATA:  RIGHT shoulder pain.  Limited range of motion. EXAM: RIGHT SHOULDER - 2+ VIEW COMPARISON:  None. FINDINGS: The humeral head is well-formed and located. Periarticular faint calcifications. The subacromial, glenohumeral and acromioclavicular joint spaces are intact. No destructive bony lesions. Soft tissue planes are non-suspicious. Calcified aortic arch. IMPRESSION: Periarticular calcification seen with HADD. No fracture deformity or dislocation. Aortic Atherosclerosis (ICD10-I70.0). Electronically Signed   By: Elon Alas M.D.   On: 11/19/2017 16:54    Procedures Procedures (including critical care  time)  Medications Ordered in ED Medications  acetaminophen (TYLENOL) tablet 650 mg (650 mg Oral Given 11/19/17 1817)  methocarbamol (ROBAXIN) tablet 500 mg (500 mg Oral Given 11/19/17 1817)  HYDROcodone-acetaminophen (NORCO/VICODIN) 5-325 MG per tablet 0.5 tablet (0.5 tablets Oral Given 11/19/17 1816)  predniSONE (DELTASONE) tablet 60 mg (60 mg Oral Given 11/19/17 1941)  ibuprofen (ADVIL,MOTRIN) tablet 400 mg (400 mg Oral Given 11/19/17 1941)  ondansetron (ZOFRAN-ODT) disintegrating tablet 4 mg (4 mg Oral Given 11/19/17 1941)     Initial Impression / Assessment and Plan / ED Course  I have reviewed the triage vital signs and the nursing notes.  Pertinent labs & imaging results that were available during my care of the patient were reviewed by me and considered in my medical decision making (see chart for details).   Low suspicion for septic arthritis. No history o inflammatory arthritides, doubt new onset.  Suspect she has bicep tendonitis vs rotator cuff injury.  xr w/ HADD is likely cause her symptoms.  Pain medications, nausea medicine, steroids and sling given.  Will follow-up with orthopedics for further management. Care management consulted and appreciate their help in obtaining PT at home to avoid frozen shoulder and try to return back to normal activity.   Final Clinical Impressions(s) / ED Diagnoses   Final diagnoses:  Hydroxyapatite deposition disease    ED Discharge Orders        Ordered    acetaminophen (TYLENOL) 325 MG tablet  Every 8 hours PRN     11/19/17 2028    HYDROcodone-acetaminophen (NORCO/VICODIN) 5-325 MG tablet  Every 4 hours PRN     11/19/17 2028    predniSONE (DELTASONE) 20 MG tablet     11/19/17 2028    ondansetron (ZOFRAN) 4 MG tablet  Every 8 hours PRN     11/19/17 2028    Home Health     11/19/17 2033    Face-to-face encounter (required for Medicare/Medicaid patients)    Comments:  I Coreon Simkins, Corene Cornea certify that this patient is under my care and that  I, or a nurse practitioner or physician's assistant working with me, had a face-to-face encounter that meets the physician face-to-face encounter requirements with this patient on 11/19/2017. The encounter with the patient was in whole, or in part for the following medical condition(s) which is the primary reason for home health care (List medical condition): has severe shoulder pain and severe limitation of use secondary to decreased range of motion. Further orders per primary provider.   11/19/17 2033       Elmin Wiederholt, Corene Cornea, MD 11/20/17 6269

## 2017-11-19 NOTE — ED Triage Notes (Signed)
Pt states right shoulder pain worse with movement. Pt denies injury. Unable to move very much. Pain has progressed since waking up this am.

## 2017-11-19 NOTE — Discharge Instructions (Signed)
I think you have a disorder called a hydroxyapatite deposition disease.  This caused some calcification of the tendons and ligaments possibly inside the joint around your shoulder.  I think this was causing her symptoms.  Please use the sling as little as possible but only for pain relief.  If it does not seem to be help with your pain please do not continue to use it.  Please do range of motion exercises much as you can I will try to have a physical therapist come to your house to help you with that.  Please call orthopedics for follow-up has sometimes steroid injections can help if oral medications are not helping.  Expect a call from case management sometime the next 24 to 48 hours.

## 2017-11-22 LAB — CBG MONITORING, ED: Glucose-Capillary: 103 mg/dL — ABNORMAL HIGH (ref 65–99)

## 2017-11-22 NOTE — Care Management (Signed)
CM consult received for [facility] referral. CM called pt's home x2 with no answer. CM has sent referral to [place], [employer] rep who will pull pt info from chart.

## 2018-06-03 ENCOUNTER — Encounter (HOSPITAL_COMMUNITY): Payer: Self-pay | Admitting: Emergency Medicine

## 2018-06-03 ENCOUNTER — Other Ambulatory Visit: Payer: Self-pay

## 2018-06-03 ENCOUNTER — Emergency Department (HOSPITAL_COMMUNITY)
Admission: EM | Admit: 2018-06-03 | Discharge: 2018-06-03 | Disposition: A | Discharge status: Home / Self Care | Payer: Medicare Other | Attending: Emergency Medicine | Admitting: Emergency Medicine

## 2018-06-03 DIAGNOSIS — E119 Type 2 diabetes mellitus without complications: Secondary | ICD-10-CM | POA: Insufficient documentation

## 2018-06-03 DIAGNOSIS — I10 Essential (primary) hypertension: Secondary | ICD-10-CM

## 2018-06-03 DIAGNOSIS — E876 Hypokalemia: Secondary | ICD-10-CM | POA: Diagnosis not present

## 2018-06-03 DIAGNOSIS — R350 Frequency of micturition: Secondary | ICD-10-CM | POA: Insufficient documentation

## 2018-06-03 LAB — CBC WITH DIFFERENTIAL/PLATELET
ABS IMMATURE GRANULOCYTES: 0.02 10*3/uL (ref 0.00–0.07)
Basophils Absolute: 0 10*3/uL (ref 0.0–0.1)
Basophils Relative: 0 %
EOS PCT: 3 %
Eosinophils Absolute: 0.3 10*3/uL (ref 0.0–0.5)
HEMATOCRIT: 30.6 % — AB (ref 36.0–46.0)
HEMOGLOBIN: 9.8 g/dL — AB (ref 12.0–15.0)
Immature Granulocytes: 0 %
Lymphocytes Relative: 27 %
Lymphs Abs: 2.8 10*3/uL (ref 0.7–4.0)
MCH: 29.8 pg (ref 26.0–34.0)
MCHC: 32 g/dL (ref 30.0–36.0)
MCV: 93 fL (ref 80.0–100.0)
MONO ABS: 0.9 10*3/uL (ref 0.1–1.0)
Monocytes Relative: 9 %
NEUTROS ABS: 6.1 10*3/uL (ref 1.7–7.7)
NRBC: 0 % (ref 0.0–0.2)
Neutrophils Relative %: 61 %
Platelets: 285 10*3/uL (ref 150–400)
RBC: 3.29 MIL/uL — AB (ref 3.87–5.11)
RDW: 13.1 % (ref 11.5–15.5)
WBC: 10.2 10*3/uL (ref 4.0–10.5)

## 2018-06-03 LAB — COMPREHENSIVE METABOLIC PANEL
ALT: 17 U/L (ref 0–44)
AST: 23 U/L (ref 15–41)
Albumin: 3.8 g/dL (ref 3.5–5.0)
Alkaline Phosphatase: 43 U/L (ref 38–126)
Anion gap: 12 (ref 5–15)
BILIRUBIN TOTAL: 0.6 mg/dL (ref 0.3–1.2)
BUN: 14 mg/dL (ref 8–23)
CO2: 23 mmol/L (ref 22–32)
Calcium: 8.8 mg/dL — ABNORMAL LOW (ref 8.9–10.3)
Chloride: 97 mmol/L — ABNORMAL LOW (ref 98–111)
Creatinine, Ser: 0.74 mg/dL (ref 0.44–1.00)
GFR calc non Af Amer: >60 mL/min (ref >60)
Glucose, Bld: 78 mg/dL (ref 70–99)
POTASSIUM: 3.1 mmol/L — AB (ref 3.5–5.1)
Sodium: 132 mmol/L — ABNORMAL LOW (ref 135–145)
TOTAL PROTEIN: 6.9 g/dL (ref 6.5–8.1)

## 2018-06-03 LAB — URINALYSIS, ROUTINE W REFLEX MICROSCOPIC
BILIRUBIN URINE: NEGATIVE
Bacteria, UA: NONE SEEN
GLUCOSE, UA: NEGATIVE mg/dL
KETONES UR: NEGATIVE mg/dL
NITRITE: NEGATIVE
PH: 7 (ref 5.0–8.0)
Protein, ur: NEGATIVE mg/dL
Specific Gravity, Urine: 1.008 (ref 1.005–1.030)

## 2018-06-03 LAB — TROPONIN I: Troponin I: <0.03 ng/mL (ref <0.03)

## 2018-06-03 MED ORDER — HYDRALAZINE HCL 20 MG/ML IJ SOLN
10.0000 mg | Freq: Once | INTRAMUSCULAR | Status: DC
  Filled 2018-06-03: qty 1

## 2018-06-03 NOTE — ED Triage Notes (Signed)
Pt reports hypertension since this am with readings 458'A systolic but denies headache/dizziness or other symptoms. Pt also reports urinating frequently today and some slight lower back discomfort.

## 2018-06-03 NOTE — ED Provider Notes (Signed)
Emergency Department Provider Note   I have reviewed the triage vital signs and the nursing notes.   HISTORY  Chief Complaint Urinary Tract Infection   HPI Amber Herman is a 73 y.o. female with PMH of A-fib, DM, GERD, HTN, and prior TIA resents to the emergency department with asymptomatic hypertension and mild UTI symptoms.  Patient states that she does not regularly check her blood pressure at home and believes the last time she checked it was several months ago.  She states she is been feeling in her normal state of health with the exception of some mild right lower back pain and urine frequency.  She denies any dysuria or hesitancy.  No fevers or chills.  She states that yesterday she had a brief moment where she did not feel well but has difficulty describing exactly what symptoms she was having.  She specifically denies chest pain, shortness of breath, headache, vision changes, gait instability, speech changes, or difficulty swallowing.  She is been compliant with her home medications including Eliquis, losartan, and carvedilol.  She checked her blood pressure today along with her blood sugar but denies symptoms prompting her to do so.  She found it to be elevated with systolics greater than 379 after several readings so presented to the emergency department.  Past Medical History:  Diagnosis Date  . Allergic rhinitis   . Anemia   . Atrial fibrillation ([place])    Diagnosed 2013  . DM (diabetes mellitus), type 2 ([place])   . GERD (gastroesophageal reflux disease)   . Hyperlipidemia   . Hypertension   . Nephrolithiasis   . Transient ischemic attack (TIA)   . Vitamin D deficiency     Patient Active Problem List   Diagnosis Date Noted  . NSTEMI (non-ST elevated myocardial infarction) ([place]) 11/02/2017  . AF (paroxysmal atrial fibrillation) ([place]) 02/27/2017  . Pyelonephritis 02/26/2017  . TIA (transient ischemic attack) 06/29/2014  .  term (current) use of anticoagulants  11/30/2011  . Anemia 11/26/2011  . Atrial fibrillation with rapid ventricular response ([place]) 11/25/2011  . Hyponatremia 11/25/2011  . Nausea & vomiting 11/25/2011  . Diarrhea 11/25/2011  . Hypomagnesemia 11/25/2011  . Diabetes mellitus, type II ([place]) 11/25/2011  . Hypertension 11/25/2011    Past Surgical History:  Procedure Laterality Date  . CHOLECYSTECTOMY     Allergies Amlodipine; Angiotensin receptor blockers; Calcium channel blockers; Nicardipine hcl; Simvastatin; Sulfa antibiotics; Sulfacetamide sodium; and Sulfasalazine  Family History  Problem Relation Age of Onset  . Hypertension Father     Social History Social History   Tobacco Use  . Smoking status: Never Smoker  . Smokeless tobacco: Never Used  Substance Use Topics  . Alcohol use: No  . Drug use: No    Review of Systems  Constitutional: No fever/chills Eyes: No visual changes. ENT: No sore throat. Cardiovascular: Denies chest pain. Positive HTN at home.  Respiratory: Denies shortness of breath. Gastrointestinal: No abdominal pain.  No nausea, no vomiting.  No diarrhea.  No constipation. Genitourinary: Negative for dysuria. Positive urine frequency.  Musculoskeletal: Positive mild right lower back pain.  Skin: Negative for rash. Neurological: Negative for headaches, focal weakness or numbness.  10-point ROS otherwise negative.  ____________________________________________   PHYSICAL EXAM:  VITAL SIGNS: ED Triage Vitals [06/03/18 1929]  Enc Vitals Group     BP (!) 211/69     Pulse Rate (!) 59     Resp 18     Temp 97.8 F (36.6 C)  Temp Source Oral     SpO2 99 %     Weight 148 lb (67.1 kg)     Height 5\' 4"  (1.626 m)     Pain Score 0   Constitutional: Alert and oriented. Well appearing and in no acute distress. Eyes: Conjunctivae are normal. PERRL.  Head: Atraumatic. Nose: No congestion/rhinnorhea. Mouth/Throat: Mucous membranes are moist.  Oropharynx non-erythematous. Neck: No  stridor.  Cardiovascular: Bradycardia. Good peripheral circulation. Grossly normal heart sounds.   Respiratory: Normal respiratory effort.  No retractions. Lungs CTAB. Gastrointestinal: Soft and nontender. No distention. No CVA tenderness.  Musculoskeletal: No lower extremity tenderness nor edema. No gross deformities of extremities. Neurologic:  Normal speech and language. No gross focal neurologic deficits are appreciated. Normal CN exam 2-12.  Skin:  Skin is warm, dry and intact. No rash noted.  ____________________________________________   LABS (all labs ordered are listed, but only abnormal results are displayed)  Labs Reviewed  URINALYSIS, ROUTINE W REFLEX MICROSCOPIC - Abnormal; Notable for the following components:      Result Value   Color, Urine STRAW (*)    Hgb urine dipstick SMALL (*)    Leukocytes, UA SMALL (*)    All other components within normal limits  COMPREHENSIVE METABOLIC PANEL - Abnormal; Notable for the following components:   Sodium 132 (*)    Potassium 3.1 (*)    Chloride 97 (*)    Calcium 8.8 (*)    All other components within normal limits  CBC WITH DIFFERENTIAL/PLATELET - Abnormal; Notable for the following components:   RBC 3.29 (*)    Hemoglobin 9.8 (*)    HCT 30.6 (*)    All other components within normal limits  TROPONIN I   ____________________________________________  EKG   EKG Interpretation  Date/Time:  Friday June 03 2018 20:20:31 EST Ventricular Rate:  63 PR Interval:    QRS Duration: 96 QT Interval:  462 QTC Calculation: 473 R Axis:   35 Text Interpretation:  Sinus rhythm Nonspecific T abnormalities, lateral leads Baseline wander in lead(s) I V1 No STEMI.  Confirmed by Nanda Quinton [phone]) on 06/03/2018 8:26:21 PM       ____________________________________________  RADIOLOGY  None ____________________________________________   PROCEDURES  Procedure(s) performed:    Procedures  None ____________________________________________   INITIAL IMPRESSION / ASSESSMENT AND PLAN / ED COURSE  Pertinent labs & imaging results that were available during my care of the patient were reviewed by me and considered in my medical decision making (see chart for details).  Patient presents to day with asymptomatic hypertension and mild UTI symptoms.  Patient is well-appearing with no focal neurological deficits or findings on exam or historical features which lead me to suspect hypertensive emergency.  Patient does describe some mild UTI symptoms so plan on UA.  Patient's blood pressure is elevated here.  Plan for screening labs including troponin with brief episode yesterday of "feeling bad" however that feeling has not persisted. No indication for neuro-imaging.   09:20 PM Patient reevaluated.  Blood pressure downtrending without intervention.  Lab work with no acute findings.  She does have baseline hypokalemia and has been compliant with her home potassium supplementation.  Plan to continue this and her other medications.  No evidence of a urinary tract infection.  No symptoms at this time.  Plan for close primary care physician follow-up for medication adjustment as needed.  Discussed ED return precautions in detail.  ____________________________________________  FINAL CLINICAL IMPRESSION(S) / ED DIAGNOSES  Final diagnoses:  Essential  hypertension  Urinary frequency  Hypokalemia     MEDICATIONS GIVEN DURING THIS VISIT:  None  Note:  This document was prepared using Dragon voice recognition software and may include unintentional dictation errors.  Nanda Quinton, MD Emergency Medicine    , [facility], MD 06/03/18 2126

## 2018-06-03 NOTE — Discharge Instructions (Signed)

## 2019-02-04 ENCOUNTER — Other Ambulatory Visit: Payer: Self-pay

## 2019-02-04 ENCOUNTER — Encounter (HOSPITAL_COMMUNITY): Payer: Self-pay | Admitting: Emergency Medicine

## 2019-02-04 ENCOUNTER — Emergency Department (HOSPITAL_COMMUNITY)
Admission: EM | Admit: 2019-02-04 | Discharge: 2019-02-04 | Disposition: A | Discharge status: Home / Self Care | Payer: Medicare Other | Attending: Emergency Medicine | Admitting: Emergency Medicine

## 2019-02-04 DIAGNOSIS — I10 Essential (primary) hypertension: Secondary | ICD-10-CM | POA: Insufficient documentation

## 2019-02-04 DIAGNOSIS — E119 Type 2 diabetes mellitus without complications: Secondary | ICD-10-CM | POA: Diagnosis not present

## 2019-02-04 DIAGNOSIS — S199XXA Unspecified injury of neck, initial encounter: Secondary | ICD-10-CM | POA: Diagnosis present

## 2019-02-04 DIAGNOSIS — Y999 Unspecified external cause status: Secondary | ICD-10-CM | POA: Diagnosis not present

## 2019-02-04 DIAGNOSIS — Y939 Activity, unspecified: Secondary | ICD-10-CM | POA: Diagnosis not present

## 2019-02-04 DIAGNOSIS — X58XXXA Exposure to other specified factors, initial encounter: Secondary | ICD-10-CM | POA: Insufficient documentation

## 2019-02-04 DIAGNOSIS — S161XXA Strain of muscle, fascia and tendon at neck level, initial encounter: Secondary | ICD-10-CM | POA: Insufficient documentation

## 2019-02-04 DIAGNOSIS — Y929 Unspecified place or not applicable: Secondary | ICD-10-CM | POA: Insufficient documentation

## 2019-02-04 MED ORDER — METHOCARBAMOL 500 MG PO TABS: 500.0000 mg | ORAL_TABLET | Freq: Three times a day (TID) | ORAL | 0 refills | Status: DC | PRN

## 2019-02-04 NOTE — ED Triage Notes (Addendum)
Patient c/o neck pain that radiates into shoulders bilaterally. Patient states started Tuesday in neck with stiffness. Denies any injury. Patient states pain progressively getting worse and is worse with movement. Patient talked to PCP by phone yesterday and prescribed tylenol 325mg  and flexeril 5mg . Per patient no relief. CNS intact.

## 2019-02-04 NOTE — Discharge Instructions (Addendum)
You were evaluated in the Emergency Department and after careful evaluation, we did not find any emergent condition requiring admission or further testing in the hospital.  Your exam/testing today is overall reassuring.  Your pain seems to be related to the strain or spasm of the neck muscles.  We recommend continuing the Tylenol at home, you can take as much as 1000 mg every 6 hours for the pain.  We will switch your muscle relaxer.  Stop taking the Flexeril, you can use the Robaxin medication as needed for more significant pain.  Please use caution as muscle relaxers can cause drowsiness.  Please return to the Emergency Department if you experience any worsening of your condition.  We encourage you to follow up with a primary care provider.  Thank you for allowing [place] to be a part of your care.

## 2019-02-04 NOTE — ED Provider Notes (Signed)
[facility] Emergency Department Provider Note MRN:  [id number]  Arrival date & time: 02/04/19     Chief Complaint   Neck Pain   History of Present Illness   Amber Herman is a 73 y.o. year-old female with a history of A. fib, diabetes, GERD presenting to the ED with chief complaint of neck pain.  2 or 3 days ago, patient explains that she woke up with a "crick" in her neck.  Located on the right side, described as a stiffness, was having some pain moving the neck to the right side.  She explains that this pain has become worse, has spread to both sides of the neck.  Significant pain with any motion of the neck.  Denies trauma, no fall, no headache, no vision change, no nausea, no vomiting, no chest pain, shortness of breath, no numbness or weakness to the arms or legs, no bowel or bladder dysfunction.  Review of Systems  A complete 10 system review of systems was obtained and all systems are negative except as noted in the HPI and PMH.   Patient's Health History    Past Medical History:  Diagnosis Date  . Allergic rhinitis   . Anemia   . Atrial fibrillation ([place])    Diagnosed 2013  . DM (diabetes mellitus), type 2 ([place])   . GERD (gastroesophageal reflux disease)   . Hyperlipidemia   . Hypertension   . Nephrolithiasis   . Transient ischemic attack (TIA)   . Vitamin D deficiency     Past Surgical History:  Procedure Laterality Date  . CHOLECYSTECTOMY      Family History  Problem Relation Age of Onset  . Hypertension Father     Social History   Socioeconomic History  . Marital status: Widowed    Spouse name: Not on file  . Number of children: Not on file  . Years of education: Not on file  . Highest education level: Not on file  Occupational History  . Not on file  Social Needs  . Financial resource strain: Not on file  . Food insecurity    Worry: Not on file    Inability: Not on file  . Transportation needs    Medical: Not on file     Non-medical: Not on file  Tobacco Use  . Smoking status: Never Smoker  . Smokeless tobacco: Never Used  Substance and Sexual Activity  . Alcohol use: No  . Drug use: No  . Sexual activity: Not Currently  Lifestyle  . Physical activity    Days per week: Not on file    Minutes per session: Not on file  . Stress: Not on file  Relationships  . Social Herbalist on phone: Not on file    Gets together: Not on file    Attends religious service: Not on file    Active member of club or organization: Not on file    Attends meetings of clubs or organizations: Not on file    Relationship status: Not on file  . Intimate partner violence    Fear of current or ex partner: Not on file    Emotionally abused: Not on file    Physically abused: Not on file    Forced sexual activity: Not on file  Other Topics Concern  . Not on file  Social History Narrative  . Not on file     Physical Exam  Vital Signs and Nursing Notes reviewed Vitals:  02/04/19 1641  BP: (!) 148/51  Pulse: 72  Resp: 18  Temp: 98.3 F (36.8 C)  SpO2: 98%    CONSTITUTIONAL: Well-appearing, NAD NEURO:  Alert and oriented x 3, no focal deficits EYES:  eyes equal and reactive ENT/NECK:  no LAD, no JVD CARDIO: Regular rate, well-perfused, normal S1 and S2 PULM:  CTAB no wheezing or rhonchi GI/GU:  normal bowel sounds, non-distended, non-tender MSK/SPINE:  No gross deformities, no edema, no C, T, or L midline spinal tenderness, tenderness palpation to the lateral neck musculature bilaterally SKIN:  no rash, atraumatic PSYCH:  Appropriate speech and behavior  Diagnostic and Interventional Summary    Labs Reviewed - No data to display  No orders to display    Medications - No data to display   Procedures Critical Care  ED Course and Medical Decision Making  I have reviewed the triage vital signs and the nursing notes.  Pertinent labs & imaging results that were available during my care of the  patient were reviewed by me and considered in my medical decision making (see below for details).  Consistent with strain or spasm of the cervical muscles, no indication for imaging today.  No red flag symptoms to suggest myelopathy.  Appropriate for discharge with continued symptomatic management.  Barth Kirks. Sedonia Small, MD [place] [email or web address]  Final Clinical Impressions(s) / ED Diagnoses     ICD-10-CM   1. Acute strain of neck muscle, initial encounter  S16.1XXA     ED Discharge Orders         Ordered    methocarbamol (ROBAXIN) 500 MG tablet  Every 8 hours PRN     02/04/19 1700          Discharge Instructions Discussed with and Provided to Patient:   Discharge Instructions     You were evaluated in the Emergency Department and after careful evaluation, we did not find any emergent condition requiring admission or further testing in the hospital.  Your exam/testing today is overall reassuring.  Your pain seems to be related to the strain or spasm of the neck muscles.  We recommend continuing the Tylenol at home, you can take as much as 1000 mg every 6 hours for the pain.  We will switch your muscle relaxer.  Stop taking the Flexeril, you can use the Robaxin medication as needed for more significant pain.  Please use caution as muscle relaxers can cause drowsiness.  Please return to the Emergency Department if you experience any worsening of your condition.  We encourage you to follow up with a primary care provider.  Thank you for allowing [place] to be a part of your care.      Maudie Flakes, MD 02/04/19 [phone]

## 2019-02-19 ENCOUNTER — Emergency Department (HOSPITAL_COMMUNITY): Payer: Medicare Other

## 2019-02-19 ENCOUNTER — Encounter (HOSPITAL_COMMUNITY): Payer: Self-pay | Admitting: Emergency Medicine

## 2019-02-19 ENCOUNTER — Other Ambulatory Visit: Payer: Self-pay

## 2019-02-19 ENCOUNTER — Inpatient Hospital Stay (HOSPITAL_COMMUNITY)
Admission: EM | Admit: 2019-02-19 | Discharge: 2019-02-23 | DRG: 281 | Disposition: A | Discharge status: Home / Self Care | Payer: Medicare Other | Attending: Family Medicine | Admitting: Family Medicine

## 2019-02-19 DIAGNOSIS — R739 Hyperglycemia, unspecified: Secondary | ICD-10-CM

## 2019-02-19 DIAGNOSIS — D649 Anemia, unspecified: Secondary | ICD-10-CM | POA: Diagnosis present

## 2019-02-19 DIAGNOSIS — Z888 Allergy status to other drugs, medicaments and biological substances status: Secondary | ICD-10-CM

## 2019-02-19 DIAGNOSIS — E876 Hypokalemia: Secondary | ICD-10-CM | POA: Diagnosis present

## 2019-02-19 DIAGNOSIS — E785 Hyperlipidemia, unspecified: Secondary | ICD-10-CM | POA: Diagnosis present

## 2019-02-19 DIAGNOSIS — E1165 Type 2 diabetes mellitus with hyperglycemia: Secondary | ICD-10-CM | POA: Diagnosis present

## 2019-02-19 DIAGNOSIS — E871 Hypo-osmolality and hyponatremia: Secondary | ICD-10-CM | POA: Diagnosis present

## 2019-02-19 DIAGNOSIS — M542 Cervicalgia: Secondary | ICD-10-CM | POA: Diagnosis present

## 2019-02-19 DIAGNOSIS — Z7982 Long term (current) use of aspirin: Secondary | ICD-10-CM

## 2019-02-19 DIAGNOSIS — I959 Hypotension, unspecified: Secondary | ICD-10-CM | POA: Diagnosis present

## 2019-02-19 DIAGNOSIS — Z79891 Long term (current) use of opiate analgesic: Secondary | ICD-10-CM

## 2019-02-19 DIAGNOSIS — I4819 Other persistent atrial fibrillation: Secondary | ICD-10-CM

## 2019-02-19 DIAGNOSIS — I10 Essential (primary) hypertension: Secondary | ICD-10-CM | POA: Diagnosis present

## 2019-02-19 DIAGNOSIS — Z87442 Personal history of urinary calculi: Secondary | ICD-10-CM

## 2019-02-19 DIAGNOSIS — I4891 Unspecified atrial fibrillation: Secondary | ICD-10-CM

## 2019-02-19 DIAGNOSIS — I214 Non-ST elevation (NSTEMI) myocardial infarction: Secondary | ICD-10-CM | POA: Diagnosis not present

## 2019-02-19 DIAGNOSIS — Z8673 Personal history of transient ischemic attack (TIA), and cerebral infarction without residual deficits: Secondary | ICD-10-CM

## 2019-02-19 DIAGNOSIS — K219 Gastro-esophageal reflux disease without esophagitis: Secondary | ICD-10-CM | POA: Diagnosis present

## 2019-02-19 DIAGNOSIS — I200 Unstable angina: Secondary | ICD-10-CM

## 2019-02-19 DIAGNOSIS — Z794 Long term (current) use of insulin: Secondary | ICD-10-CM

## 2019-02-19 DIAGNOSIS — I248 Other forms of acute ischemic heart disease: Secondary | ICD-10-CM

## 2019-02-19 DIAGNOSIS — Z20828 Contact with and (suspected) exposure to other viral communicable diseases: Secondary | ICD-10-CM | POA: Diagnosis present

## 2019-02-19 DIAGNOSIS — E559 Vitamin D deficiency, unspecified: Secondary | ICD-10-CM | POA: Diagnosis present

## 2019-02-19 DIAGNOSIS — Z8249 Family history of ischemic heart disease and other diseases of the circulatory system: Secondary | ICD-10-CM

## 2019-02-19 DIAGNOSIS — T380X5A Adverse effect of glucocorticoids and synthetic analogues, initial encounter: Secondary | ICD-10-CM | POA: Diagnosis present

## 2019-02-19 DIAGNOSIS — Z79899 Other long term (current) drug therapy: Secondary | ICD-10-CM

## 2019-02-19 DIAGNOSIS — Z66 Do not resuscitate: Secondary | ICD-10-CM | POA: Diagnosis present

## 2019-02-19 DIAGNOSIS — Z7901 Long term (current) use of anticoagulants: Secondary | ICD-10-CM

## 2019-02-19 LAB — CBC WITH DIFFERENTIAL/PLATELET
Abs Immature Granulocytes: 0.21 10*3/uL — ABNORMAL HIGH (ref 0.00–0.07)
Basophils Absolute: 0 10*3/uL (ref 0.0–0.1)
Basophils Relative: 0 %
Eosinophils Absolute: 0 10*3/uL (ref 0.0–0.5)
Eosinophils Relative: 0 %
HCT: 37.9 % (ref 36.0–46.0)
Hemoglobin: 12.2 g/dL (ref 12.0–15.0)
Immature Granulocytes: 1 %
Lymphocytes Relative: 10 %
Lymphs Abs: 1.7 10*3/uL (ref 0.7–4.0)
MCH: 30.3 pg (ref 26.0–34.0)
MCHC: 32.2 g/dL (ref 30.0–36.0)
MCV: 94.3 fL (ref 80.0–100.0)
Monocytes Absolute: 0.8 10*3/uL (ref 0.1–1.0)
Monocytes Relative: 5 %
Neutro Abs: 13.3 10*3/uL — ABNORMAL HIGH (ref 1.7–7.7)
Neutrophils Relative %: 84 %
Platelets: 513 10*3/uL — ABNORMAL HIGH (ref 150–400)
RBC: 4.02 MIL/uL (ref 3.87–5.11)
RDW: 12.8 % (ref 11.5–15.5)
WBC: 16 10*3/uL — ABNORMAL HIGH (ref 4.0–10.5)
nRBC: 0 % (ref 0.0–0.2)

## 2019-02-19 MED ORDER — METOPROLOL TARTRATE 5 MG/5ML IV SOLN
5.0000 mg | Freq: Once | INTRAVENOUS | Status: AC
  Administered 2019-02-20: 5 mg via INTRAVENOUS
  Filled 2019-02-19: qty 5

## 2019-02-19 NOTE — ED Triage Notes (Signed)
Pt reports went to bed tonight around 8pm. Pt reports woke up with chest pain, got out of bed and almost fainted with diaphoresis. Pt reports checked CBG and it was almost 500. Pt reports that she was put prednisone for right arm/neck pain for one week ago. On arrival pt complaining of chest pain mid chest. EKG done

## 2019-02-19 NOTE — ED Provider Notes (Signed)
[facility] EMERGENCY DEPARTMENT Provider Note   CSN: [id number] Arrival date & time: 02/19/19  2238     History   Chief Complaint Chief Complaint  Patient presents with  . Irregular Heart Beat    HPI Amber Herman is a 73 y.o. female.     The history is provided by the patient.  Chest Pain Pain location:  Substernal area Pain radiates to:  Does not radiate Pain severity:  Moderate Onset quality:  Sudden Timing:  Constant Progression:  Unchanged Chronicity:  New Relieved by:  None tried Worsened by:  Nothing Associated symptoms: fatigue and shortness of breath   Patient with history of atrial fibrillation, diabetes, hypertension, hyperlipidemia presents with chest pain shortness of breath.  She reports waking up tonight and having chest pain and feeling short of breath and weak.  She checked her glucose and it was over 500.  Patient reports she was recently put on prednisone for neck pain. Patient reports continued chest pain  Past Medical History:  Diagnosis Date  . Allergic rhinitis   . Anemia   . Atrial fibrillation ([place])    Diagnosed 2013  . DM (diabetes mellitus), type 2 ([place])   . GERD (gastroesophageal reflux disease)   . Hyperlipidemia   . Hypertension   . Nephrolithiasis   . Transient ischemic attack (TIA)   . Vitamin D deficiency     Patient Active Problem List   Diagnosis Date Noted  . NSTEMI (non-ST elevated myocardial infarction) ([place]) 11/02/2017  . AF (paroxysmal atrial fibrillation) ([place]) 02/27/2017  . Pyelonephritis 02/26/2017  . TIA (transient ischemic attack) 06/29/2014  . Long term (current) use of anticoagulants 11/30/2011  . Anemia 11/26/2011  . Atrial fibrillation with rapid ventricular response ([place]) 11/25/2011  . Hyponatremia 11/25/2011  . Nausea & vomiting 11/25/2011  . Diarrhea 11/25/2011  . Hypomagnesemia 11/25/2011  . Diabetes mellitus, type II ([place]) 11/25/2011  . Hypertension 11/25/2011    Past Surgical History:   Procedure Laterality Date  . CHOLECYSTECTOMY       OB History    Gravida  4   Para  3   Term  3   Preterm      AB  1   Living  3     SAB  1   TAB      Ectopic      Multiple      Live Births               Home Medications    Prior to Admission medications   Medication Sig Start Date End Date Taking? Authorizing Provider  acetaminophen (TYLENOL) 325 MG tablet Take 2 tablets (650 mg total) by mouth every 8 (eight) hours as needed. 11/19/17   Mesner, Corene Cornea, MD  apixaban (ELIQUIS) 5 MG TABS tablet Take 1 tablet (5 mg total) by mouth 2 (two) times daily. 11/03/17   Manuella Ghazi, Pratik D, DO  aspirin EC 81 MG tablet Take 1 tablet (81 mg total) by mouth daily. 06/30/14   Kathie Dike, MD  carvedilol (COREG) 25 MG tablet Take 1 tablet (25 mg total) by mouth 2 (two) times daily with a meal. 03/01/17   Tat, Shanon Brow, MD  chlorthalidone (HYGROTON) 50 MG tablet Take 50 mg by mouth daily.  06/27/14   [provider]  HYDROcodone-acetaminophen (NORCO/VICODIN) 5-325 MG tablet Take 0.5-1 tablets by mouth every 4 (four) hours as needed for severe pain. 11/19/17   Mesner, Corene Cornea, MD  metFORMIN (GLUCOPHAGE) 1000 MG tablet Take 1,000  mg by mouth 2 (two) times daily with a meal.    [provider]  methocarbamol (ROBAXIN) 500 MG tablet Take 1 tablet (500 mg total) by mouth every 8 (eight) hours as needed for muscle spasms. 02/04/19   Maudie Flakes, MD  ondansetron (ZOFRAN) 4 MG tablet Take 1 tablet (4 mg total) by mouth every 8 (eight) hours as needed for nausea or vomiting. 11/19/17   Mesner, Corene Cornea, MD  Potassium Chloride Crys ER (KLOR-CON M20 PO) Take 1 tablet by mouth daily.    [provider]  pravastatin (PRAVACHOL) 40 MG tablet Take 40 mg by mouth at bedtime.     [provider]  predniSONE (DELTASONE) 20 MG tablet 3 tabs po daily x 3 days, then 2 tabs x 3 days, then 1.5 tabs x 3 days, then 1 tab x 3 days, then 0.5 tabs x 3 days 11/19/17   Mesner, Corene Cornea, MD   TRESIBA FLEXTOUCH 200 UNIT/ML SOPN Inject 24 Units into the skin at bedtime.  02/10/17   [provider]    Family History Family History  Problem Relation Age of Onset  . Hypertension Father     Social History Social History   Tobacco Use  . Smoking status: Never Smoker  . Smokeless tobacco: Never Used  Substance Use Topics  . Alcohol use: No  . Drug use: No     Allergies   Amlodipine, Angiotensin receptor blockers, Calcium channel blockers, Nicardipine hcl, Simvastatin, Sulfa antibiotics, Sulfacetamide sodium, and Sulfasalazine   Review of Systems Review of Systems  Constitutional: Positive for fatigue.  Respiratory: Positive for shortness of breath.   Cardiovascular: Positive for chest pain.  All other systems reviewed and are negative.    Physical Exam Updated Vital Signs BP (!) 159/83 (BP Location: Right Arm)   Pulse 68   Temp 98 F (36.7 C) (Temporal)   Resp (!) 22   SpO2 100%   Physical Exam  CONSTITUTIONAL: Elderly, no acute distress HEAD: Normocephalic/atraumatic EYES: EOMI/PERRL ENMT: Mucous membranes moist NECK: supple no meningeal signs SPINE/BACK:entire spine nontender CV: Tachycardic, irregular LUNGS: Lungs are clear to auscultation bilaterally, no apparent distress ABDOMEN: soft, nontender, no rebound or guarding, bowel sounds noted throughout abdomen GU:no cva tenderness NEURO: Pt is awake/alert/appropriate, moves all extremitiesx4.  No facial droop.   EXTREMITIES: pulses normal/equal, full ROM SKIN: warm, color normal PSYCH: no abnormalities of mood noted, alert and oriented to situation  ED Treatments / Results  Labs (all labs ordered are listed, but only abnormal results are displayed) Labs Reviewed  BASIC METABOLIC PANEL - Abnormal; Notable for the following components:      Result Value   Sodium 128 (*)    Chloride 91 (*)    Glucose, Bld 343 (*)    BUN 29 (*)    GFR calc non Af Amer 58 (*)    All other components  within normal limits  CBC WITH DIFFERENTIAL/PLATELET - Abnormal; Notable for the following components:   WBC 16.0 (*)    Platelets 513 (*)    Neutro Abs 13.3 (*)    Abs Immature Granulocytes 0.21 (*)    All other components within normal limits  TROPONIN I (HIGH SENSITIVITY) - Abnormal; Notable for the following components:   Troponin I (High Sensitivity) 84 (*)    All other components within normal limits  SARS CORONAVIRUS 2 (TAT 6-24 HRS)  TROPONIN I (HIGH SENSITIVITY)    EKG EKG Interpretation  Date/Time:  Sunday February 19 2019 22:43:28  EDT Ventricular Rate:  137 PR Interval:    QRS Duration: 94 QT Interval:  296 QTC Calculation: 446 R Axis:   64 Text Interpretation:  Atrial fibrillation with rapid ventricular response Marked ST abnormality, possible inferolateral subendocardial injury Abnormal ECG changed from prior Confirmed by Ripley Fraise [phone]) on 02/19/2019 11:29:49 PM   Radiology Dg Chest Port 1 View  Result Date: 02/19/2019 CLINICAL DATA:  Chest pain EXAM: PORTABLE CHEST 1 VIEW COMPARISON:  11/02/2017 FINDINGS: Mild cardiomegaly with aortic atherosclerosis. No consolidation or effusion. No pneumothorax. IMPRESSION: No active disease.  Mild cardiomegaly. Electronically Signed   By: Donavan Foil M.D.   On: 02/19/2019 23:27    Procedures .Critical Care Performed by: Ripley Fraise, MD Authorized by: Ripley Fraise, MD   Critical care provider statement:    Critical care time (minutes):  50   Critical care start time:  02/19/2019 11:40 PM   Critical care end time:  02/20/2019 12:30 AM   Critical care time was exclusive of:  Separately billable procedures and treating other patients   Critical care was necessary to treat or prevent imminent or life-threatening deterioration of the following conditions:  Cardiac failure and endocrine crisis   Critical care was time spent personally by me on the following activities:  Re-evaluation of patient's condition,  pulse oximetry, review of old charts, ordering and review of radiographic studies, ordering and review of laboratory studies, evaluation of patient's response to treatment, examination of patient, discussions with consultants, development of treatment plan with patient or surrogate and ordering and performing treatments and interventions   I assumed direction of critical care for this patient from another provider in my specialty: no      Medications Ordered in ED Medications  nitroGLYCERIN (NITROSTAT) SL tablet 0.4 mg (has no administration in time range)  metoprolol tartrate (LOPRESSOR) injection 5 mg (5 mg Intravenous Given 02/20/19 0016)     Initial Impression / Assessment and Plan / ED Course  I have reviewed the triage vital signs and the nursing notes.  Pertinent labs & imaging results that were available during my care of the patient were reviewed by me and considered in my medical decision making (see chart for details).        11:40 PM Patient presents with chest pain shortness of breath as well as tachycardia.  Patient is in atrial fibrillation with RVR.  She has multiple drug allergies with limits rate control.  Will give Lopressor.  She will require admission 1:20 AM Patient had good response to Lopressor with her heart rate.  It Is now in the low 100s.  She is feeling improved.  She will be admitted for further monitoring.  She would need better control of her heart rate. Discussed with Dr. Darrick Meigs for admission   This patients CHA2DS2-VASc Score and unadjusted Ischemic Stroke Rate (% per year) is equal to 9.7 % stroke rate/year from a score of 6  Above score calculated as 1 point each if present [CHF, HTN, DM, Vascular=MI/PAD/Aortic Plaque, Age if 65-74, or Female] Above score calculated as 2 points each if present [Age > 75, or Stroke/TIA/TE]    Final Clinical Impressions(s) / ED Diagnoses   Final diagnoses:  Atrial fibrillation with rapid ventricular response ([place])   Hyperglycemia  Unstable angina [facility])    ED Discharge Orders    None       Ripley Fraise, MD 02/20/19 0121

## 2019-02-20 ENCOUNTER — Encounter (HOSPITAL_COMMUNITY): Payer: Self-pay

## 2019-02-20 DIAGNOSIS — R739 Hyperglycemia, unspecified: Secondary | ICD-10-CM | POA: Diagnosis not present

## 2019-02-20 DIAGNOSIS — I214 Non-ST elevation (NSTEMI) myocardial infarction: Principal | ICD-10-CM

## 2019-02-20 DIAGNOSIS — I200 Unstable angina: Secondary | ICD-10-CM

## 2019-02-20 DIAGNOSIS — I4891 Unspecified atrial fibrillation: Secondary | ICD-10-CM | POA: Diagnosis present

## 2019-02-20 LAB — BASIC METABOLIC PANEL
Anion gap: 14 (ref 5–15)
BUN: 29 mg/dL — ABNORMAL HIGH (ref 8–23)
CO2: 23 mmol/L (ref 22–32)
Calcium: 9.4 mg/dL (ref 8.9–10.3)
Chloride: 91 mmol/L — ABNORMAL LOW (ref 98–111)
Creatinine, Ser: 0.97 mg/dL (ref 0.44–1.00)
GFR calc Af Amer: >60 mL/min (ref >60)
GFR calc non Af Amer: 58 mL/min — ABNORMAL LOW (ref >60)
Glucose, Bld: 343 mg/dL — ABNORMAL HIGH (ref 70–99)
Potassium: 4.1 mmol/L (ref 3.5–5.1)
Sodium: 128 mmol/L — ABNORMAL LOW (ref 135–145)

## 2019-02-20 LAB — COMPREHENSIVE METABOLIC PANEL
ALT: 30 U/L (ref 0–44)
AST: 26 U/L (ref 15–41)
Albumin: 3.7 g/dL (ref 3.5–5.0)
Alkaline Phosphatase: 50 U/L (ref 38–126)
Anion gap: 14 (ref 5–15)
BUN: 25 mg/dL — ABNORMAL HIGH (ref 8–23)
CO2: 25 mmol/L (ref 22–32)
Calcium: 9.2 mg/dL (ref 8.9–10.3)
Chloride: 94 mmol/L — ABNORMAL LOW (ref 98–111)
Creatinine, Ser: 0.8 mg/dL (ref 0.44–1.00)
GFR calc Af Amer: >60 mL/min (ref >60)
GFR calc non Af Amer: >60 mL/min (ref >60)
Glucose, Bld: 158 mg/dL — ABNORMAL HIGH (ref 70–99)
Potassium: 3.5 mmol/L (ref 3.5–5.1)
Sodium: 133 mmol/L — ABNORMAL LOW (ref 135–145)
Total Bilirubin: 0.4 mg/dL (ref 0.3–1.2)
Total Protein: 6.8 g/dL (ref 6.5–8.1)

## 2019-02-20 LAB — TROPONIN I (HIGH SENSITIVITY)
Troponin I (High Sensitivity): 84 ng/L — ABNORMAL HIGH (ref <18)
Troponin I (High Sensitivity): 416 ng/L (ref <18)
Troponin I (High Sensitivity): 1279 ng/L (ref <18)
Troponin I (High Sensitivity): 751 ng/L (ref <18)
Troponin I (High Sensitivity): 718 ng/L (ref <18)

## 2019-02-20 LAB — GLUCOSE, CAPILLARY
Glucose-Capillary: 199 mg/dL — ABNORMAL HIGH (ref 70–99)
Glucose-Capillary: 209 mg/dL — ABNORMAL HIGH (ref 70–99)
Glucose-Capillary: 299 mg/dL — ABNORMAL HIGH (ref 70–99)
Glucose-Capillary: 275 mg/dL — ABNORMAL HIGH (ref 70–99)

## 2019-02-20 LAB — CBC
HCT: 34.4 % — ABNORMAL LOW (ref 36.0–46.0)
Hemoglobin: 11.1 g/dL — ABNORMAL LOW (ref 12.0–15.0)
MCH: 29.9 pg (ref 26.0–34.0)
MCHC: 32.3 g/dL (ref 30.0–36.0)
MCV: 92.7 fL (ref 80.0–100.0)
Platelets: 464 10*3/uL — ABNORMAL HIGH (ref 150–400)
RBC: 3.71 MIL/uL — ABNORMAL LOW (ref 3.87–5.11)
RDW: 12.7 % (ref 11.5–15.5)
WBC: 15.4 10*3/uL — ABNORMAL HIGH (ref 4.0–10.5)
nRBC: 0 % (ref 0.0–0.2)

## 2019-02-20 LAB — HEMOGLOBIN A1C
Hgb A1c MFr Bld: 7.3 % — ABNORMAL HIGH (ref 4.8–5.6)
Mean Plasma Glucose: 162.81 mg/dL

## 2019-02-20 LAB — OSMOLALITY: Osmolality: 280 mOsm/kg (ref 275–295)

## 2019-02-20 LAB — SARS CORONAVIRUS 2 (TAT 6-24 HRS): SARS Coronavirus 2: NEGATIVE

## 2019-02-20 LAB — OSMOLALITY, URINE: Osmolality, Ur: 414 mOsm/kg (ref 300–900)

## 2019-02-20 LAB — HEPARIN LEVEL (UNFRACTIONATED): Heparin Unfractionated: >2.2 [IU]/mL — ABNORMAL HIGH (ref 0.30–0.70)

## 2019-02-20 LAB — APTT: aPTT: 29 s (ref 24–36)

## 2019-02-20 LAB — MRSA PCR SCREENING: MRSA by PCR: NEGATIVE

## 2019-02-20 MED ORDER — ONDANSETRON HCL 4 MG/2ML IJ SOLN: 4.0000 mg | Freq: Four times a day (QID) | INTRAMUSCULAR | Status: DC | PRN

## 2019-02-20 MED ORDER — CARVEDILOL 25 MG PO TABS
25.0000 mg | ORAL_TABLET | Freq: Two times a day (BID) | ORAL | Status: DC
  Administered 2019-02-20 – 2019-02-23 (×6): 25 mg via ORAL
  Filled 2019-02-20: qty 2
  Filled 2019-02-20: qty 1
  Filled 2019-02-20 (×2): qty 2
  Filled 2019-02-20 (×3): qty 1

## 2019-02-20 MED ORDER — INFLUENZA VAC A&B SA ADJ QUAD 0.5 ML IM PRSY
0.5000 mL | PREFILLED_SYRINGE | INTRAMUSCULAR | Status: DC
  Filled 2019-02-20: qty 0.5

## 2019-02-20 MED ORDER — SODIUM CHLORIDE 0.9 % IV SOLN: INTRAVENOUS | Status: DC

## 2019-02-20 MED ORDER — INSULIN ASPART 100 UNIT/ML ~~LOC~~ SOLN
0.0000 [IU] | Freq: Three times a day (TID) | SUBCUTANEOUS | Status: DC
  Administered 2019-02-20: 5 [IU] via SUBCUTANEOUS
  Administered 2019-02-20: 12:00:00 3 [IU] via SUBCUTANEOUS
  Administered 2019-02-20 – 2019-02-22 (×4): 2 [IU] via SUBCUTANEOUS
  Administered 2019-02-23: 1 [IU] via SUBCUTANEOUS
  Administered 2019-02-23: 2 [IU] via SUBCUTANEOUS

## 2019-02-20 MED ORDER — PRAVASTATIN SODIUM 40 MG PO TABS
40.0000 mg | ORAL_TABLET | Freq: Every day | ORAL | Status: DC
  Administered 2019-02-20 – 2019-02-22 (×3): 40 mg via ORAL
  Filled 2019-02-20 (×3): qty 1

## 2019-02-20 MED ORDER — METOPROLOL TARTRATE 5 MG/5ML IV SOLN: 5.0000 mg | Freq: Once | INTRAVENOUS | Status: AC

## 2019-02-20 MED ORDER — METOPROLOL TARTRATE 5 MG/5ML IV SOLN
1.0000 mg | Freq: Once | INTRAVENOUS | Status: AC
  Administered 2019-02-20: 1 mg via INTRAVENOUS
  Filled 2019-02-20: qty 5

## 2019-02-20 MED ORDER — HEPARIN (PORCINE) 25000 UT/250ML-% IV SOLN: 800.0000 [IU]/h | INTRAVENOUS | Status: DC

## 2019-02-20 MED ORDER — APIXABAN 5 MG PO TABS
5.0000 mg | ORAL_TABLET | Freq: Two times a day (BID) | ORAL | Status: DC
  Administered 2019-02-20: 5 mg via ORAL
  Filled 2019-02-20: qty 1

## 2019-02-20 MED ORDER — CHLORHEXIDINE GLUCONATE CLOTH 2 % EX PADS
6.0000 | MEDICATED_PAD | Freq: Every day | CUTANEOUS | Status: DC
  Administered 2019-02-20 – 2019-02-22 (×3): 6 via TOPICAL

## 2019-02-20 MED ORDER — AMIODARONE HCL IN DEXTROSE 360-4.14 MG/200ML-% IV SOLN
60.0000 mg/h | INTRAVENOUS | Status: AC
  Administered 2019-02-20 (×2): 60 mg/h via INTRAVENOUS
  Filled 2019-02-20 (×2): qty 200

## 2019-02-20 MED ORDER — METHOCARBAMOL 500 MG PO TABS: 500.0000 mg | ORAL_TABLET | Freq: Three times a day (TID) | ORAL | Status: DC | PRN

## 2019-02-20 MED ORDER — METOPROLOL TARTRATE 5 MG/5ML IV SOLN
INTRAVENOUS | Status: AC
  Administered 2019-02-20: 5 mg
  Filled 2019-02-20: qty 5

## 2019-02-20 MED ORDER — ONDANSETRON HCL 4 MG PO TABS: 4.0000 mg | ORAL_TABLET | Freq: Four times a day (QID) | ORAL | Status: DC | PRN

## 2019-02-20 MED ORDER — ASPIRIN EC 81 MG PO TBEC
81.0000 mg | DELAYED_RELEASE_TABLET | Freq: Every day | ORAL | Status: DC
  Administered 2019-02-20 – 2019-02-23 (×4): 81 mg via ORAL
  Filled 2019-02-20 (×4): qty 1

## 2019-02-20 MED ORDER — AMIODARONE IV BOLUS ONLY 150 MG/100ML
150.0000 mg | Freq: Once | INTRAVENOUS | Status: AC
  Administered 2019-02-20: 150 mg via INTRAVENOUS
  Filled 2019-02-20: qty 100

## 2019-02-20 MED ORDER — ACETAMINOPHEN 325 MG PO TABS: 650.0000 mg | ORAL_TABLET | Freq: Four times a day (QID) | ORAL | Status: DC | PRN

## 2019-02-20 MED ORDER — NITROGLYCERIN IN D5W 200-5 MCG/ML-% IV SOLN
5.0000 ug/min | INTRAVENOUS | Status: DC
  Administered 2019-02-20: 5 ug/min via INTRAVENOUS
  Filled 2019-02-20: qty 250

## 2019-02-20 MED ORDER — NITROGLYCERIN 0.4 MG SL SUBL
0.4000 mg | SUBLINGUAL_TABLET | SUBLINGUAL | Status: DC | PRN
  Administered 2019-02-20: 0.4 mg via SUBLINGUAL
  Filled 2019-02-20: qty 1

## 2019-02-20 MED ORDER — AMIODARONE HCL IN DEXTROSE 360-4.14 MG/200ML-% IV SOLN
30.0000 mg/h | INTRAVENOUS | Status: DC
  Administered 2019-02-21 – 2019-02-22 (×3): 30 mg/h via INTRAVENOUS
  Filled 2019-02-20 (×3): qty 200

## 2019-02-20 MED ORDER — INSULIN DETEMIR 100 UNIT/ML ~~LOC~~ SOLN
20.0000 [IU] | Freq: Every day | SUBCUTANEOUS | Status: DC
  Administered 2019-02-20 – 2019-02-22 (×3): 20 [IU] via SUBCUTANEOUS
  Filled 2019-02-20 (×5): qty 0.2

## 2019-02-20 MED ORDER — HYDROCODONE-ACETAMINOPHEN 5-325 MG PO TABS: 0.5000 | ORAL_TABLET | ORAL | Status: DC | PRN

## 2019-02-20 MED ORDER — HEPARIN (PORCINE) 25000 UT/250ML-% IV SOLN
1100.0000 [IU]/h | INTRAVENOUS | Status: DC
  Administered 2019-02-20: 800 [IU]/h via INTRAVENOUS
  Filled 2019-02-20: qty 250

## 2019-02-20 MED ORDER — PANTOPRAZOLE SODIUM 40 MG PO TBEC
40.0000 mg | DELAYED_RELEASE_TABLET | Freq: Two times a day (BID) | ORAL | Status: DC
  Administered 2019-02-20 – 2019-02-23 (×7): 40 mg via ORAL
  Filled 2019-02-20 (×7): qty 1

## 2019-02-20 MED ORDER — PREDNISONE 20 MG PO TABS
40.0000 mg | ORAL_TABLET | Freq: Every day | ORAL | Status: DC
  Administered 2019-02-20 – 2019-02-21 (×2): 40 mg via ORAL
  Filled 2019-02-20 (×4): qty 2

## 2019-02-20 MED ORDER — INSULIN DEGLUDEC 200 UNIT/ML ~~LOC~~ SOPN: 24.0000 [IU] | PEN_INJECTOR | Freq: Every day | SUBCUTANEOUS | Status: DC

## 2019-02-20 NOTE — Consult Note (Addendum)
Cardiology Consultation:   Patient ID: Amber Herman MRN: [id number]; DOB: 1945-10-18  Admit date: 02/19/2019 Date of Consult: 02/20/2019  Primary Care Provider: Renee Rival, NP Primary Cardiologist: Dr. Darnell Level, [place] Primary Electrophysiologist:  None     Patient Profile:   Amber Herman is a 73 y.o. female with a history of PAF, type 2 diabetes mellitus, hypertension, hyperlipidemia, previous TIA now presenting with chest pain and rapid atrial fibrillation with abnormal cardiac enzymes.  Cardiology consultation is requested by Dr. Darrick Meigs.  History of Present Illness:   Amber Herman is a 73 y.o. female followed by Dr. Donn Pierini in [place] with paroxysmal atrial fibrillation on Eliquis for stroke prophylaxis.  She also has HTN, HLD, TIA, DM2, on prednisone taper for neck pain. Patient comes in with chest pain, palpitations, shortness of breath, Afib with RVR, glucose over 500. HS troponins 84, 416, 1279. EKG Afib with RVR with diffuse ST abnormalities suggesting global ischemic change. Patient says chest burning and pressure off/on but worse this am after breakfast. Having a lot of reflux as well. Had 2 episodes last night where everything "went black" and she thought she would pass out. Works 8-10 hrs daily at [employer]. Hasn't had pain like this before.   Heart Pathway Score:     Past Medical History:  Diagnosis Date   Allergic rhinitis    Anemia    Atrial fibrillation ([place])    Diagnosed 2013   DM (diabetes mellitus), type 2 ([facility])    GERD (gastroesophageal reflux disease)    Hyperlipidemia    Hypertension    Nephrolithiasis    Transient ischemic attack (TIA)    Vitamin D deficiency     Past Surgical History:  Procedure Laterality Date   CHOLECYSTECTOMY       Home Medications:  Prior to Admission medications   Medication Sig Start Date End Date Taking? Authorizing Provider  acetaminophen (TYLENOL) 325 MG tablet Take 2  tablets (650 mg total) by mouth every 8 (eight) hours as needed. 11/19/17   Mesner, Corene Cornea, MD  apixaban (ELIQUIS) 5 MG TABS tablet Take 1 tablet (5 mg total) by mouth 2 (two) times daily. 11/03/17   Manuella Ghazi, Pratik D, DO  aspirin EC 81 MG tablet Take 1 tablet (81 mg total) by mouth daily. 06/30/14   Kathie Dike, MD  carvedilol (COREG) 25 MG tablet Take 1 tablet (25 mg total) by mouth 2 (two) times daily with a meal. 03/01/17   Tat, Shanon Brow, MD  chlorthalidone (HYGROTON) 50 MG tablet Take 50 mg by mouth daily.  06/27/14   [provider]  HYDROcodone-acetaminophen (NORCO/VICODIN) 5-325 MG tablet Take 0.5-1 tablets by mouth every 4 (four) hours as needed for severe pain. 11/19/17   Mesner, Corene Cornea, MD  metFORMIN (GLUCOPHAGE) 1000 MG tablet Take 1,000 mg by mouth 2 (two) times daily with a meal.    [provider]  methocarbamol (ROBAXIN) 500 MG tablet Take 1 tablet (500 mg total) by mouth every 8 (eight) hours as needed for muscle spasms. 02/04/19   Maudie Flakes, MD  ondansetron (ZOFRAN) 4 MG tablet Take 1 tablet (4 mg total) by mouth every 8 (eight) hours as needed for nausea or vomiting. 11/19/17   Mesner, Corene Cornea, MD  Potassium Chloride Crys ER (KLOR-CON M20 PO) Take 1 tablet by mouth daily.    [provider]  pravastatin (PRAVACHOL) 40 MG tablet Take 40 mg by mouth at bedtime.     [provider]  predniSONE (DELTASONE) 20 MG tablet 3 tabs po daily x 3 days, then 2 tabs x 3 days, then 1.5 tabs x 3 days, then 1 tab x 3 days, then 0.5 tabs x 3 days 11/19/17   Mesner, Corene Cornea, MD  TRESIBA FLEXTOUCH 200 UNIT/ML SOPN Inject 24 Units into the skin at bedtime.  02/10/17   [provider]    Inpatient Medications: Scheduled Meds:  aspirin EC  81 mg Oral Daily   carvedilol  25 mg Oral BID WC   [START ON 02/21/2019] influenza vaccine adjuvanted  0.5 mL Intramuscular Tomorrow-1000   insulin aspart  0-9 Units Subcutaneous TID WC   insulin detemir  20 Units Subcutaneous  QHS   pravastatin  40 mg Oral QHS   predniSONE  40 mg Oral Q breakfast   Continuous Infusions:  sodium chloride     nitroGLYCERIN     PRN Meds: acetaminophen, HYDROcodone-acetaminophen, methocarbamol, nitroGLYCERIN, ondansetron **OR** ondansetron (ZOFRAN) IV  Allergies:    Allergies  Allergen Reactions   Amlodipine    Angiotensin Receptor Blockers    Calcium Channel Blockers    Nicardipine Hcl    Simvastatin    Sulfa Antibiotics Itching and Rash   Sulfacetamide Sodium Itching and Rash   Sulfasalazine Itching and Rash    Social History:   Social History   Socioeconomic History   Marital status: Widowed    Spouse name: Not on file   Number of children: Not on file   Years of education: Not on file   Highest education level: Not on file  Occupational History   Not on file  Social Needs   Financial resource strain: Not hard at all   Food insecurity    Worry: Patient refused    Inability: Patient refused   Transportation needs    Medical: Patient refused    Non-medical: Patient refused  Tobacco Use   Smoking status: Never Smoker   Smokeless tobacco: Never Used  Substance and Sexual Activity   Alcohol use: No   Drug use: No   Sexual activity: Not Currently  Lifestyle   Physical activity    Days per week: Patient refused    Minutes per session: Patient refused   Stress: Rather much  Relationships   Social connections    Talks on phone: More than three times a week    Gets together: Twice a week    Attends religious service: 1 to 4 times per year    Active member of club or organization: No    Attends meetings of clubs or organizations: Never    Relationship status: Widowed   Intimate partner violence    Fear of current or ex partner: Patient refused    Emotionally abused: Patient refused    Physically abused: Patient refused    Forced sexual activity: Patient refused  Other Topics Concern   Not on file  Social History  Narrative   Not on file    Family History:     Family History  Problem Relation Age of Onset   Hypertension Father      ROS:  Please see the history of present illness.  Review of Systems  Constitution: Negative.  HENT: Negative.   Eyes: Negative.   Cardiovascular: Positive for chest pain.  Respiratory: Negative.   Hematologic/Lymphatic: Negative.   Musculoskeletal: Positive for neck pain and stiffness. Negative for joint pain.  Gastrointestinal: Positive for heartburn.  Genitourinary: Negative.   Neurological: Negative.    All other ROS reviewed and  negative.     Physical Exam/Data:   Vitals:   02/20/19 0233 02/20/19 0614 02/20/19 0800 02/20/19 0920  BP: (!) 129/57 (!) 143/79 140/88 (!) 147/72  Pulse: 78 73 (!) 117 (!) 103  Resp:  18 19 18   Temp:  97.6 F (36.4 C) 98.1 F (36.7 C) 98.5 F (36.9 C)  TempSrc:  Oral Oral Oral  SpO2: 100% 100% 100% 99%  Weight: 64.8 kg     Height: 5\' 4"  (1.626 m)       Intake/Output Summary (Last 24 hours) at 02/20/2019 0942 Last data filed at 02/20/2019 0615 Gross per 24 hour  Intake --  Output 900 ml  Net -900 ml   Last 3 Weights 02/20/2019 02/04/2019 06/03/2018  Weight (lbs) 142 lb 13.7 oz 148 lb 148 lb  Weight (kg) 64.8 kg 67.132 kg 67.132 kg     Body mass index is 24.52 kg/m.  General: Elderly woman, in no acute distress HEENT: normal Lymph: no adenopathy Neck: no JVD Endocrine:  No thryomegaly Vascular: No carotid bruits; FA pulses 2+ bilaterally without bruits  Cardiac:  Irreg irreg and rapid, 120/m; no murmur   Lungs:  clear to auscultation bilaterally, no wheezing, rhonchi or rales  Abd: soft, nontender, no hepatomegaly  Ext: no edema Musculoskeletal:  No deformities, BUE and BLE strength normal and equal Skin: warm and dry  Neuro:  CNs 2-12 intact, no focal abnormalities noted Psych:  Normal affect   EKG:  The EKG was personally reviewed and demonstrates:  Afib at 140/m with ST elevation AVR and ST T wave  changes inf/lat new from last EKG Telemetry:  Telemetry was personally reviewed and demonstrates:  Afib 100-140/m with 3 beats WCT  Relevant CV Studies:   Echocardiogram 11/02/2017: - Left ventricle: The cavity size was normal. Wall thickness was   increased in a pattern of mild LVH. Systolic function was normal.   The estimated ejection fraction was in the range of 60% to 65%.   Wall motion was normal; there were no regional wall motion   abnormalities. The study was not technically sufficient to allow   evaluation of LV diastolic dysfunction due to atrial   fibrillation. - Aortic valve: Mildly calcified annulus. - Mitral valve: Mildly calcified annulus. There was trivial   regurgitation. - Left atrium: The atrium was at the upper limits of normal in   size. - Right atrium: Central venous pressure (est): 3 mm Hg. - Atrial septum: No defect or patent foramen ovale was identified. - Tricuspid valve: There was trivial regurgitation. - Pulmonary arteries: PA peak pressure: 36 mm Hg (S). - Pericardium, extracardiac: There was no pericardial effusion.  Lexiscan Myoview 11/03/2017:  Nonspecific ST segment abnormalities seen.  The study is normal. No ischemia or scar.  This is a low risk study.  Nuclear stress EF: 74%.  Laboratory Data:  High Sensitivity Troponin:   Recent Labs  Lab 02/19/19 2315 02/20/19 0125 02/20/19 0420  TROPONINIHS 84* 416* 1,279*     Chemistry Recent Labs  Lab 02/19/19 2315 02/20/19 0420  NA 128* 133*  K 4.1 3.5  CL 91* 94*  CO2 23 25  GLUCOSE 343* 158*  BUN 29* 25*  CREATININE 0.97 0.80  CALCIUM 9.4 9.2  GFRNONAA 58* >60  GFRAA >60 >60  ANIONGAP 14 14    Recent Labs  Lab 02/20/19 0420  PROT 6.8  ALBUMIN 3.7  AST 26  ALT 30  ALKPHOS 50  BILITOT 0.4   Hematology Recent Labs  Lab 02/19/19 2315 02/20/19 0420  WBC 16.0* 15.4*  RBC 4.02 3.71*  HGB 12.2 11.1*  HCT 37.9 34.4*  MCV 94.3 92.7  MCH 30.3 29.9  MCHC 32.2 32.3  RDW  12.8 12.7  PLT 513* 464*    Radiology/Studies:  Dg Chest Port 1 View  Result Date: 02/19/2019 CLINICAL DATA:  Chest pain EXAM: PORTABLE CHEST 1 VIEW COMPARISON:  11/02/2017 FINDINGS: Mild cardiomegaly with aortic atherosclerosis. No consolidation or effusion. No pneumothorax. IMPRESSION: No active disease.  Mild cardiomegaly. Electronically Signed   By: Donavan Foil M.D.   On: 02/19/2019 23:27    Assessment and Plan:   NSTEMI with EKG changes, ongoing chest pain and Troponin over 1200, rapid Afib. Discussed with Dr. Domenic Polite and Dr. Burt Knack. She received Eliquis this am. Will plan to transfer to ICU IV Amiodarone for better rate control, IV heparin in 6 hrs, IV NTG and transfer to [facility] in am for cardiac cath.I have reviewed the risks, indications, and alternatives to angioplasty and stenting with the patient. Risks include but are not limited to bleeding, infection, vascular injury, stroke, myocardial infection, arrhythmia, kidney injury, radiation-related injury in the case of prolonged fluoroscopy use, emergency cardiac surgery, and death. The patient understands the risks of serious complication is low (123456) and patient agrees to proceed.   PAF on Eliquis. Received dose this am. Also on Coreg HTN-room for meds DM2-glucose running high on steroids Hyperlipidemia  On Pravachol Neck pain on steroid taper. History of TIA    For questions or updates, please contact [place] Please consult [email or web address] for contact info under    Signed, Ermalinda Barrios, PA-C 02/20/2019 9:42 AM   Attending note:  Patient seen and examined.  I reviewed extensive records and discussed the case with Ms. Bonnell Public PA-C as well as hospitalist team and nursing.  Amber Herman is a patient of Dr. Donn Pierini, cardiologist in [place].  She follows for paroxysmal atrial fibrillation and is on Coreg and Eliquis.  She has no previously documented history of obstructive CAD, had a low risk Lexiscan Myoview  at [facility] facility in June 2019.  She presents to the hospital describing fairly sudden onset of chest tightness and feeling of presyncope, occurred at rest, also feeling of indigestion.  She has not experienced any definite palpitations.  She reports compliance with her medications.  She was admitted by the hospitalist team with rapid atrial fibrillation by ECG, also abnormal high-sensitivity troponin I levels which have increased from 84 to 416 to 1279.  Cardiology service consulted this morning for evaluation.  On my assessment patient found to be having mild chest tightness, also in rapid atrial fibrillation with heart rates ranging from 120s to 140s by telemetry.  I personally reviewed her ECG which shows rapid atrial fibrillation and diffuse ST segment changes concerning for potential global ischemia (elevation in aVR).  She already received Eliquis dose this morning.  Systolic blood pressures ranging 120s to 140s.  Lungs clear.  Cardiac exam with irregularly irregular rhythm and no obvious gallop.  No peripheral edema.  Lab work shows potassium 3.5, BUN 25, creatinine 0.8, WBC 15.4 (prednisone taper), hemoglobin 11.1, platelets 464.  SARS coronavirus 2 test negative.  No acute process.  Case reviewed with cardiac catheterization staff at [facility], Dr. Burt Knack.  She has evidence of NSTEMI in the setting of rapid atrial fibrillation.  ECG changes are concerning for global ischemia and therefore underlying obstructive proximal CAD is a concern.  She received Eliquis dose this  morning, plan is to move her to the ICU, initiate IV amiodarone and nitroglycerin with continuation of beta-blocker.  Will hold Eliquis and initiate heparin 6 hours after her last dose.  Plan is for transfer to [facility] for diagnostic cardiac catheterization, most likely tomorrow presuming she clinically stabilizes with medical interventions.  Satira Sark, M.D., F.A.C.C.

## 2019-02-20 NOTE — Progress Notes (Signed)
Patient has refused a second dose of nitroglycerin sublingual tablet at this time. Patient denies any chest pain or discomfort and vital signs are as follows    02/20/19 0920  Vitals  BP (!) 147/72  BP Location Right Arm  BP Method Automatic  Patient Position (if appropriate) Lying  Pulse Rate (!) 103  Pulse Rate Source Dinamap  Cardiac Rhythm Atrial fibrillation  Resp 18  Oxygen Therapy  SpO2 99 %  O2 Device Room Air  Patient Activity (if Appropriate) In bed  Pain Assessment  Pain Scale 0-10  Pain Score 0  Pain Type Acute pain  Pain Location Chest  Pain Orientation Mid  Pain Descriptors / Indicators Burning  POSS Scale ([place] Opioid Sedation Scale)  POSS *See Group Information* 1-Acceptable,Awake and alert  MEWS Score  MEWS RR 0  MEWS Pulse 1  MEWS Systolic 0  MEWS LOC 0  MEWS Temp 0  MEWS Score 1  MEWS Score Color Green

## 2019-02-20 NOTE — Progress Notes (Signed)
Patient complaining of chest pain 5/10. Nitroglycerin SL tablet 0.4 mg administered per PRN order.

## 2019-02-20 NOTE — Progress Notes (Signed)
CRITICAL VALUE ALERT  Critical Value:  Trop 751  Date & Time Notied: 02/20/19 1655  Provider Notified: Dyann Kief, MD  Orders Received/Actions taken: trending down, no new orders at this time.  Celestia Khat, RN

## 2019-02-20 NOTE — Progress Notes (Signed)
CRITICAL VALUE ALERT  Critical Value:  Troponin 1279  Date & Time Notied:  02/20/19 at [place]  Provider Notified: Dr. Darrick Meigs  Orders Received/Actions taken: No reply

## 2019-02-20 NOTE — H&P (Addendum)
TRH H&P    Patient Demographics:    Amber Herman, is a 73 y.o. female  MRN: [id number]  DOB - 1946/02/27  Admit Date - 02/19/2019  Referring MD/NP/PA: Dr. Christy Gentles  Outpatient Primary MD for the patient is Renee Rival, NP  Patient coming from: Home  Chief complaint-palpitations in the chest   HPI:    Amber Herman  is a 73 y.o. female, with history of diabetes mellitus type 2 atrial fibrillation on anticoagulation with Eliquis, hypertension, hyperlipidemia, TIA, recent ER visit with neck pain, currently on prednisone taper prescribed by patient's PCP.  Patient says that she woke up to go to bathroom and was feeling short of breath and weak.  She checked her blood glucose was over  500.  Patient also had some shortness of breath with palpitations.  Describes chest discomfort on left side. In the ED patient was found to be in A. fib with RVR, she has allergy to Cardizem so she was given IV Lopressor.  Patient did take her Coreg before coming to the ED. She denies nausea vomiting or diarrhea. Denies abdominal pain or dysuria. No previous history of stroke or seizures. No history of CAD.   Review of systems:    In addition to the HPI above,  .  All other systems reviewed and are negative.    Past History of the following :    Past Medical History:  Diagnosis Date  . Allergic rhinitis   . Anemia   . Atrial fibrillation ([facility])    Diagnosed 2013  . DM (diabetes mellitus), type 2 ([place])   . GERD (gastroesophageal reflux disease)   . Hyperlipidemia   . Hypertension   . Nephrolithiasis   . Transient ischemic attack (TIA)   . Vitamin D deficiency       Past Surgical History:  Procedure Laterality Date  . CHOLECYSTECTOMY        Social History:      Social History   Tobacco Use  . Smoking status: Never Smoker  . Smokeless tobacco: Never Used  Substance Use Topics  . Alcohol use:  No       Family History :     Family History  Problem Relation Age of Onset  . Hypertension Father       Home Medications:   Prior to Admission medications   Medication Sig Start Date End Date Taking? Authorizing Provider  acetaminophen (TYLENOL) 325 MG tablet Take 2 tablets (650 mg total) by mouth every 8 (eight) hours as needed. 11/19/17   Mesner, Corene Cornea, MD  apixaban (ELIQUIS) 5 MG TABS tablet Take 1 tablet (5 mg total) by mouth 2 (two) times daily. 11/03/17   Manuella Ghazi, Pratik D, DO  aspirin EC 81 MG tablet Take 1 tablet (81 mg total) by mouth daily. 06/30/14   Kathie Dike, MD  carvedilol (COREG) 25 MG tablet Take 1 tablet (25 mg total) by mouth 2 (two) times daily with a meal. 03/01/17   Tat, Shanon Brow, MD  chlorthalidone (HYGROTON) 50 MG tablet Take 50 mg by mouth  daily.  06/27/14   [provider]  HYDROcodone-acetaminophen (NORCO/VICODIN) 5-325 MG tablet Take 0.5-1 tablets by mouth every 4 (four) hours as needed for severe pain. 11/19/17   Mesner, Corene Cornea, MD  metFORMIN (GLUCOPHAGE) 1000 MG tablet Take 1,000 mg by mouth 2 (two) times daily with a meal.    [provider]  methocarbamol (ROBAXIN) 500 MG tablet Take 1 tablet (500 mg total) by mouth every 8 (eight) hours as needed for muscle spasms. 02/04/19   Maudie Flakes, MD  ondansetron (ZOFRAN) 4 MG tablet Take 1 tablet (4 mg total) by mouth every 8 (eight) hours as needed for nausea or vomiting. 11/19/17   Mesner, Corene Cornea, MD  Potassium Chloride Crys ER (KLOR-CON M20 PO) Take 1 tablet by mouth daily.    [provider]  pravastatin (PRAVACHOL) 40 MG tablet Take 40 mg by mouth at bedtime.     [provider]  predniSONE (DELTASONE) 20 MG tablet 3 tabs po daily x 3 days, then 2 tabs x 3 days, then 1.5 tabs x 3 days, then 1 tab x 3 days, then 0.5 tabs x 3 days 11/19/17   Mesner, Corene Cornea, MD  TRESIBA FLEXTOUCH 200 UNIT/ML SOPN Inject 24 Units into the skin at bedtime.  02/10/17   [provider]      Allergies:     Allergies  Allergen Reactions  . Amlodipine   . Angiotensin Receptor Blockers   . Calcium Channel Blockers   . Nicardipine Hcl   . Simvastatin   . Sulfa Antibiotics Itching and Rash  . Sulfacetamide Sodium Itching and Rash  . Sulfasalazine Itching and Rash     Physical Exam:   Vitals  Blood pressure (!) 141/69, pulse 89, temperature 98 F (36.7 C), temperature source Temporal, resp. rate 16, SpO2 96 %.  1.  General: Appears in no acute distress  2. Psychiatric: Alert, oriented x3, intact insight and judgment  3. Neurologic: Cranial nerves II through XII grossly intact, no focal deficit noted  4. HEENMT:  Atraumatic normocephalic, extraocular muscles are intact  5. Respiratory : Clear to auscultation bilaterally  6. Cardiovascular : S1-S2, irregular no murmur auscultated  7. Gastrointestinal:  Abdomen is soft, nontender, no organomegaly      Data Review:    CBC Recent Labs  Lab 02/19/19 2315  WBC 16.0*  HGB 12.2  HCT 37.9  PLT 513*  MCV 94.3  MCH 30.3  MCHC 32.2  RDW 12.8  LYMPHSABS 1.7  MONOABS 0.8  EOSABS 0.0  BASOSABS 0.0   ------------------------------------------------------------------------------------------------------------------  Results for orders placed or performed during the hospital encounter of 02/19/19 (from the past 48 hour(s))  Basic metabolic panel     Status: Abnormal   Collection Time: 02/19/19 11:15 PM  Result Value Ref Range   Sodium 128 (L) 135 - 145 mmol/L   Potassium 4.1 3.5 - 5.1 mmol/L   Chloride 91 (L) 98 - 111 mmol/L   CO2 23 22 - 32 mmol/L   Glucose, Bld 343 (H) 70 - 99 mg/dL   BUN 29 (H) 8 - 23 mg/dL   Creatinine, Ser 0.97 0.44 - 1.00 mg/dL   Calcium 9.4 8.9 - 10.3 mg/dL   GFR calc non Af Amer 58 (L) >60 mL/min   GFR calc Af Amer >60 >60 mL/min   Anion gap 14 5 - 15    Comment: Performed at [facility], [address], [place], [place] [address]  CBC with Differential/Platelet      Status:  Abnormal   Collection Time: 02/19/19 11:15 PM  Result Value Ref Range   WBC 16.0 (H) 4.0 - 10.5 K/uL   RBC 4.02 3.87 - 5.11 MIL/uL   Hemoglobin 12.2 12.0 - 15.0 g/dL   HCT 37.9 36.0 - 46.0 %   MCV 94.3 80.0 - 100.0 fL   MCH 30.3 26.0 - 34.0 pg   MCHC 32.2 30.0 - 36.0 g/dL   RDW 12.8 11.5 - 15.5 %   Platelets 513 (H) 150 - 400 K/uL   nRBC 0.0 0.0 - 0.2 %   Neutrophils Relative % 84 %   Neutro Abs 13.3 (H) 1.7 - 7.7 K/uL   Lymphocytes Relative 10 %   Lymphs Abs 1.7 0.7 - 4.0 K/uL   Monocytes Relative 5 %   Monocytes Absolute 0.8 0.1 - 1.0 K/uL   Eosinophils Relative 0 %   Eosinophils Absolute 0.0 0.0 - 0.5 K/uL   Basophils Relative 0 %   Basophils Absolute 0.0 0.0 - 0.1 K/uL   Immature Granulocytes 1 %   Abs Immature Granulocytes 0.21 (H) 0.00 - 0.07 K/uL    Comment: Performed at [facility], [address] [place], [place] [address]  Troponin I (High Sensitivity)     Status: Abnormal   Collection Time: 02/19/19 11:15 PM  Result Value Ref Range   Troponin I (High Sensitivity) 84 (H) <18 ng/L    Comment: (NOTE) Elevated high sensitivity troponin I (hsTnI) values and significant  changes across serial measurements may suggest ACS but many other  chronic and acute conditions are known to elevate hsTnI results.  Refer to the "Links" section for chest pain algorithms and additional  guidance. Performed at [facility], [address], [place], [place] [address]     Chemistries  Recent Labs  Lab 02/19/19 2315  NA 128*  K 4.1  CL 91*  CO2 23  GLUCOSE 343*  BUN 29*  CREATININE 0.97  CALCIUM 9.4    --------------------------------------------------------------------------------------------------------------- Urine analysis:    Component Value Date/Time   COLORURINE STRAW (A) 06/03/2018 2021   APPEARANCEUR CLEAR 06/03/2018 2021   LABSPEC 1.008 06/03/2018 2021   PHURINE 7.0 06/03/2018 2021   GLUCOSEU NEGATIVE 06/03/2018 2021   HGBUR SMALL (A) 06/03/2018 2021    BILIRUBINUR NEGATIVE 06/03/2018 2021   KETONESUR NEGATIVE 06/03/2018 2021   PROTEINUR NEGATIVE 06/03/2018 2021   UROBILINOGEN 0.2 06/29/2014 1729   NITRITE NEGATIVE 06/03/2018 2021   LEUKOCYTESUR SMALL (A) 06/03/2018 2021      Imaging Results:    Dg Chest Port 1 View  Result Date: 02/19/2019 CLINICAL DATA:  Chest pain EXAM: PORTABLE CHEST 1 VIEW COMPARISON:  11/02/2017 FINDINGS: Mild cardiomegaly with aortic atherosclerosis. No consolidation or effusion. No pneumothorax. IMPRESSION: No active disease.  Mild cardiomegaly. Electronically Signed   By: Donavan Foil M.D.   On: 02/19/2019 23:27    My personal review of EKG: Rhythm atrial fibrillation with RVR   Assessment & Plan:    Active Problems:   Atrial fibrillation with RVR (HCC)   1. Atrial fibrillation with RVR-patient presented with A. fib with RVR, heart rate controlled after patient received IV metoprolol in the ED.  Will monitor closely on telemetry.  Continue Coreg 25 mg p.o. twice daily.  Patient has allergy to Cardizem.  Continue anticoagulation with Eliquis.  2. Chest pain-patient complains of chest discomfort, which is resolved at this time.  Initial troponin is negative.  Will cycle troponin x2 to rule out ACS.  Continue aspirin, nitroglycerin as needed.  3. Diabetes mellitus type 2 uncontrolled-continue Tresiba, will initiate sliding scale insulin with NovoLog.  Patient is on prednisone for neck pain so her blood glucose is elevated.   4. Hypertension-continue Coreg, will hold chlorthalidone due to hyponatremia.  5. Hyponatremia-sodium is 128, hold chlorthalidone.  Check serum and urine osmolality.  6. Neck pain-patient recently started on prednisone taper and Robaxin as needed by her orthopedic surgeon.  Continue prednisone 40 mg daily.  Patient is on tapering dose protocol.    DVT Prophylaxis-   apixaban  AM Labs Ordered, also please review Full Orders  Family Communication: Admission, patients condition  and plan of care including tests being ordered have been discussed with the patient who indicate understanding and agree with the plan and Code Status.  Code Status: DNR  Admission status: Observation: Based on patients clinical presentation and evaluation of above clinical data, I have made determination that patient will need less than 2 midnight stay in the hospital.  Time spent in minutes : 60 minutes   Oswald Hillock M.D on 02/20/2019 at 1:59 AM

## 2019-02-20 NOTE — Progress Notes (Addendum)
ANTICOAGULATION CONSULT NOTE - Initial Consult  Pharmacy Consult for Heparin Indication: chest pain/ACS  Allergies  Allergen Reactions  . Amlodipine   . Angiotensin Receptor Blockers   . Calcium Channel Blockers   . Nicardipine Hcl   . Simvastatin   . Sulfa Antibiotics Itching and Rash  . Sulfacetamide Sodium Itching and Rash  . Sulfasalazine Itching and Rash    Patient Measurements: Height: 5\' 4"  (162.6 cm) Weight: 142 lb 13.7 oz (64.8 kg) IBW/kg (Calculated) : 54.7 HEPARIN DW (KG): 64.8  Vital Signs: Temp: 98 F (36.7 C) (09/21 1122) Temp Source: Oral (09/21 1122) BP: 126/58 (09/21 1300) Pulse Rate: 93 (09/21 1300)  Labs: Recent Labs    02/19/19 2315 02/20/19 0125 02/20/19 0420  HGB 12.2  --  11.1*  HCT 37.9  --  34.4*  PLT 513*  --  464*  CREATININE 0.97  --  0.80  TROPONINIHS 84* 416* 1,279*    Estimated Creatinine Clearance: 54.1 mL/min (by C-G formula based on SCr of 0.8 mg/dL).   Medical History: Past Medical History:  Diagnosis Date  . Allergic rhinitis   . Anemia   . Atrial fibrillation ([place])    Diagnosed 2013  . DM (diabetes mellitus), type 2 ([place])   . GERD (gastroesophageal reflux disease)   . Hyperlipidemia   . Hypertension   . Nephrolithiasis   . Transient ischemic attack (TIA)   . Vitamin D deficiency     Medications:  Medications Prior to Admission  Medication Sig Dispense Refill Last Dose  . acetaminophen (TYLENOL) 325 MG tablet Take 2 tablets (650 mg total) by mouth every 8 (eight) hours as needed. 60 tablet 0 Past Week at Unknown time  . apixaban (ELIQUIS) 5 MG TABS tablet Take 1 tablet (5 mg total) by mouth 2 (two) times daily. 60 tablet 1 02/19/2019 at 0900pm  . aspirin EC 81 MG tablet Take 1 tablet (81 mg total) by mouth daily. 30 tablet 1 02/19/2019 at Unknown time  . carvedilol (COREG) 25 MG tablet Take 1 tablet (25 mg total) by mouth 2 (two) times daily with a meal. 60 tablet 0 02/19/2019 at Unknown time  . chlorthalidone  (HYGROTON) 50 MG tablet Take 50 mg by mouth daily.    02/19/2019 at Unknown time  . cyclobenzaprine (FLEXERIL) 5 MG tablet Take 5 mg by mouth 3 (three) times daily as needed (pain).    unknown at Unknown time  . FEROSUL 325 (65 Fe) MG tablet Take 325 mg by mouth daily.    02/19/2019 at Unknown time  . losartan (COZAAR) 100 MG tablet Take 100 mg by mouth daily.    02/19/2019 at Unknown time  . metFORMIN (GLUCOPHAGE) 850 MG tablet Take 850 mg by mouth 2 (two) times daily with a meal.    02/19/2019 at Unknown time  . methocarbamol (ROBAXIN) 500 MG tablet Take 1 tablet (500 mg total) by mouth every 8 (eight) hours as needed for muscle spasms. 30 tablet 0 Past Week at Unknown time  . Potassium Chloride Crys ER (KLOR-CON M20 PO) Take 1 tablet by mouth daily.   02/19/2019 at Unknown time  . pravastatin (PRAVACHOL) 40 MG tablet Take 40 mg by mouth at bedtime.    02/19/2019 at Unknown time  . predniSONE (DELTASONE) 20 MG tablet 3 tabs po daily x 3 days, then 2 tabs x 3 days, then 1.5 tabs x 3 days, then 1 tab x 3 days, then 0.5 tabs x 3 days 27 tablet 0 02/19/2019 at  Unknown time  . traMADol (ULTRAM) 50 MG tablet Take 50 mg by mouth every 8 (eight) hours as needed for moderate pain.   unknown  . TRESIBA FLEXTOUCH 200 UNIT/ML SOPN Inject 36 Units into the skin at bedtime.    02/19/2019 at 0600pm  . triamcinolone cream (KENALOG) 0.1 % Apply 1 application topically See admin instructions. 1 application to affected area twice daily, Mix 1:1 with Eucerin externally   02/19/2019 at Unknown time  . vitamin B-12 (CYANOCOBALAMIN) 1000 MCG tablet Take 1,000 mcg by mouth daily.    02/19/2019 at Unknown time    Assessment: Patient admitted secondary to chest pain. Cardiology plans to transfer to [facility] for cath in AM. Patient on on eliquis chronically for afib and last dose was given this AM at 0823. Pharmacy asked to transition to heparin 6 hours after eliquis dose given. Discussed with PA Estella Husk. With rapid afib  nonstemi, wanted to start heparin earlier(6 hours) than 12 hours after last dose of eliquis.  Will order APTT and heparin levels to monitor.   Goal of Therapy:  Heparin level 0.3-0.7 units/ml aPTT 66-102 seconds Monitor platelets by anticoagulation protocol: Yes   Plan:   Start heparin infusion at 800 units/hr at 1630 this evening then Check anti-Xa level in ~8  hours and daily while on heparin Continue to monitor H&H and platelets  Isac Sarna, BS [place], BCPS Clinical Pharmacist Pager [phone] 02/20/2019,3:10 PM

## 2019-02-20 NOTE — Progress Notes (Signed)
Patient transferred to ICU 08. Report given to [facility]. All questions were answered and no further questions at this time. Patient is currently in stable condition and denies any acute distress or discomfort.

## 2019-02-20 NOTE — Care Management Obs Status (Signed)
[facility] NOTIFICATION   Patient Details  Name: Amber Herman MRN: [id number] Date of Birth: Jun 26, 1945   Medicare Observation Status Notification Given:  Yes    Tommy Medal 02/20/2019, 2:11 PM

## 2019-02-20 NOTE — Progress Notes (Signed)
CRITICAL VALUE ALERT  Critical Value:  Troponin 416  Date & Time Notied:  02/20/19 at 0300  Provider Notified: Dr. Darrick Meigs  Orders Received/Actions taken: No new orders

## 2019-02-20 NOTE — Progress Notes (Signed)
CRITICAL VALUE ALERT  Critical Value:  Trop 718  Date & Time Notied:  02/20/19 1838  Provider Notified: Dyann Kief, MD  Orders Received/Actions taken: trending down, no new orders at this time. Will continue to monitor.  Garwin Brothers Dishmon, RN\

## 2019-02-20 NOTE — Plan of Care (Signed)
  Problem: Clinical Measurements: Goal: Diagnostic test results will improve Outcome: Progressing Goal: Cardiovascular complication will be avoided Outcome: Progressing   Problem: Coping: Goal: Level of anxiety will decrease Outcome: Progressing   Problem: Pain Managment: Goal: General experience of comfort will improve Outcome: Progressing   

## 2019-02-20 NOTE — Progress Notes (Signed)
Patient seen and examined. Admitted after midnight secondary to chest pain and transient episode of A. fib with RVR. Patient is hemodynamically stable, but still having CP and now back to A. Fib with RVR. EKG with some new changes, even difficult to assess given abnormal rate/rhythm, troponin elevated. Cardiology service consulted and recommends for cath, given the fact she got her eliquis and ate today already, will move to stepdown, start her on NTG drip and amiodarone drip; 6 hours after eliquis will start heparin drip and most likely transfer to [facility] tomorrow for cath. Please refer to H7P written by Dr. Darrick Meigs for further info/details on admission.  Plan: -NPO after midnight -NTG drip and Amiodarone Drip -follow cardiology service recommendations -PPI BID.   Barton Dubois MD [phone]

## 2019-02-20 NOTE — Progress Notes (Signed)
MEDICATION RELATED CONSULT NOTE - INITIAL   Pharmacy Consult for Drug?drug interactions with amiodarone Indication: Amiodarone  Allergies  Allergen Reactions  . Amlodipine   . Angiotensin Receptor Blockers   . Calcium Channel Blockers   . Nicardipine Hcl   . Simvastatin   . Sulfa Antibiotics Itching and Rash  . Sulfacetamide Sodium Itching and Rash  . Sulfasalazine Itching and Rash      Medical History: Past Medical History:  Diagnosis Date  . Allergic rhinitis   . Anemia   . Atrial fibrillation ([place])    Diagnosed 2013  . DM (diabetes mellitus), type 2 ([place])   . GERD (gastroesophageal reflux disease)   . Hyperlipidemia   . Hypertension   . Nephrolithiasis   . Transient ischemic attack (TIA)   . Vitamin D deficiency     Medications:  Medications Prior to Admission  Medication Sig Dispense Refill Last Dose  . acetaminophen (TYLENOL) 325 MG tablet Take 2 tablets (650 mg total) by mouth every 8 (eight) hours as needed. 60 tablet 0 Past Week at Unknown time  . apixaban (ELIQUIS) 5 MG TABS tablet Take 1 tablet (5 mg total) by mouth 2 (two) times daily. 60 tablet 1 02/19/2019 at 0900pm  . aspirin EC 81 MG tablet Take 1 tablet (81 mg total) by mouth daily. 30 tablet 1 02/19/2019 at Unknown time  . carvedilol (COREG) 25 MG tablet Take 1 tablet (25 mg total) by mouth 2 (two) times daily with a meal. 60 tablet 0 02/19/2019 at Unknown time  . chlorthalidone (HYGROTON) 50 MG tablet Take 50 mg by mouth daily.    02/19/2019 at Unknown time  . cyclobenzaprine (FLEXERIL) 5 MG tablet Take 5 mg by mouth 3 (three) times daily as needed (pain).    unknown at Unknown time  . FEROSUL 325 (65 Fe) MG tablet Take 325 mg by mouth daily.    02/19/2019 at Unknown time  . losartan (COZAAR) 100 MG tablet Take 100 mg by mouth daily.    02/19/2019 at Unknown time  . metFORMIN (GLUCOPHAGE) 850 MG tablet Take 850 mg by mouth 2 (two) times daily with a meal.    02/19/2019 at Unknown time  . methocarbamol  (ROBAXIN) 500 MG tablet Take 1 tablet (500 mg total) by mouth every 8 (eight) hours as needed for muscle spasms. 30 tablet 0 Past Week at Unknown time  . Potassium Chloride Crys ER (KLOR-CON M20 PO) Take 1 tablet by mouth daily.   02/19/2019 at Unknown time  . pravastatin (PRAVACHOL) 40 MG tablet Take 40 mg by mouth at bedtime.    02/19/2019 at Unknown time  . predniSONE (DELTASONE) 20 MG tablet 3 tabs po daily x 3 days, then 2 tabs x 3 days, then 1.5 tabs x 3 days, then 1 tab x 3 days, then 0.5 tabs x 3 days 27 tablet 0 02/19/2019 at Unknown time  . traMADol (ULTRAM) 50 MG tablet Take 50 mg by mouth every 8 (eight) hours as needed for moderate pain.   unknown  . TRESIBA FLEXTOUCH 200 UNIT/ML SOPN Inject 36 Units into the skin at bedtime.    02/19/2019 at 0600pm  . triamcinolone cream (KENALOG) 0.1 % Apply 1 application topically See admin instructions. 1 application to affected area twice daily, Mix 1:1 with Eucerin externally   02/19/2019 at Unknown time  . vitamin B-12 (CYANOCOBALAMIN) 1000 MCG tablet Take 1,000 mcg by mouth daily.    02/19/2019 at Unknown time    Assessment/Plan: Patient presented  to ED with Chest pain. Patient also in afib and subsequently amiodarone was initiated. Pharmacy asked to review regimen for drug interactions 1. Amiodarone and carvedilol: may result in increased carvedilol concentrations. May need to decrease dose 2. Amiodarone and tramadol: can increase concentrations of tramadol and increased risk of respiratory depression 3. Amiodarone and losartan: may increase plasma levels of losartan. Dosage adjustment maybe required.  Isac Sarna, BS [place], BCPS Clinical Pharmacist Pager [phone] 02/20/2019,3:54 PM

## 2019-02-21 ENCOUNTER — Encounter (HOSPITAL_COMMUNITY)
Admission: EM | Disposition: A | Discharge status: Home / Self Care | Payer: Self-pay | Source: Home / Self Care | Attending: Internal Medicine

## 2019-02-21 ENCOUNTER — Inpatient Hospital Stay (HOSPITAL_COMMUNITY): Payer: Medicare Other

## 2019-02-21 ENCOUNTER — Other Ambulatory Visit (HOSPITAL_COMMUNITY): Payer: Medicare Other

## 2019-02-21 DIAGNOSIS — Z87442 Personal history of urinary calculi: Secondary | ICD-10-CM | POA: Diagnosis not present

## 2019-02-21 DIAGNOSIS — E1165 Type 2 diabetes mellitus with hyperglycemia: Secondary | ICD-10-CM | POA: Diagnosis present

## 2019-02-21 DIAGNOSIS — E559 Vitamin D deficiency, unspecified: Secondary | ICD-10-CM | POA: Diagnosis present

## 2019-02-21 DIAGNOSIS — Z7901 Long term (current) use of anticoagulants: Secondary | ICD-10-CM | POA: Diagnosis not present

## 2019-02-21 DIAGNOSIS — T380X5A Adverse effect of glucocorticoids and synthetic analogues, initial encounter: Secondary | ICD-10-CM | POA: Diagnosis present

## 2019-02-21 DIAGNOSIS — I10 Essential (primary) hypertension: Secondary | ICD-10-CM | POA: Diagnosis present

## 2019-02-21 DIAGNOSIS — I248 Other forms of acute ischemic heart disease: Secondary | ICD-10-CM

## 2019-02-21 DIAGNOSIS — D649 Anemia, unspecified: Secondary | ICD-10-CM | POA: Diagnosis present

## 2019-02-21 DIAGNOSIS — E785 Hyperlipidemia, unspecified: Secondary | ICD-10-CM | POA: Diagnosis present

## 2019-02-21 DIAGNOSIS — Z794 Long term (current) use of insulin: Secondary | ICD-10-CM | POA: Diagnosis not present

## 2019-02-21 DIAGNOSIS — I214 Non-ST elevation (NSTEMI) myocardial infarction: Secondary | ICD-10-CM | POA: Diagnosis present

## 2019-02-21 DIAGNOSIS — Z7982 Long term (current) use of aspirin: Secondary | ICD-10-CM | POA: Diagnosis not present

## 2019-02-21 DIAGNOSIS — Z8249 Family history of ischemic heart disease and other diseases of the circulatory system: Secondary | ICD-10-CM | POA: Diagnosis not present

## 2019-02-21 DIAGNOSIS — E876 Hypokalemia: Secondary | ICD-10-CM | POA: Diagnosis present

## 2019-02-21 DIAGNOSIS — Z888 Allergy status to other drugs, medicaments and biological substances status: Secondary | ICD-10-CM | POA: Diagnosis not present

## 2019-02-21 DIAGNOSIS — I340 Nonrheumatic mitral (valve) insufficiency: Secondary | ICD-10-CM | POA: Diagnosis not present

## 2019-02-21 DIAGNOSIS — I200 Unstable angina: Secondary | ICD-10-CM | POA: Diagnosis present

## 2019-02-21 DIAGNOSIS — I4891 Unspecified atrial fibrillation: Secondary | ICD-10-CM | POA: Diagnosis not present

## 2019-02-21 DIAGNOSIS — M542 Cervicalgia: Secondary | ICD-10-CM | POA: Diagnosis present

## 2019-02-21 DIAGNOSIS — I4819 Other persistent atrial fibrillation: Secondary | ICD-10-CM | POA: Diagnosis present

## 2019-02-21 DIAGNOSIS — Z79891 Long term (current) use of opiate analgesic: Secondary | ICD-10-CM | POA: Diagnosis not present

## 2019-02-21 DIAGNOSIS — Z20828 Contact with and (suspected) exposure to other viral communicable diseases: Secondary | ICD-10-CM | POA: Diagnosis present

## 2019-02-21 DIAGNOSIS — Z66 Do not resuscitate: Secondary | ICD-10-CM | POA: Diagnosis present

## 2019-02-21 DIAGNOSIS — I959 Hypotension, unspecified: Secondary | ICD-10-CM | POA: Diagnosis present

## 2019-02-21 DIAGNOSIS — E871 Hypo-osmolality and hyponatremia: Secondary | ICD-10-CM | POA: Diagnosis present

## 2019-02-21 DIAGNOSIS — I2510 Atherosclerotic heart disease of native coronary artery without angina pectoris: Secondary | ICD-10-CM

## 2019-02-21 DIAGNOSIS — K219 Gastro-esophageal reflux disease without esophagitis: Secondary | ICD-10-CM | POA: Diagnosis present

## 2019-02-21 DIAGNOSIS — Z79899 Other long term (current) drug therapy: Secondary | ICD-10-CM | POA: Diagnosis not present

## 2019-02-21 DIAGNOSIS — Z8673 Personal history of transient ischemic attack (TIA), and cerebral infarction without residual deficits: Secondary | ICD-10-CM | POA: Diagnosis not present

## 2019-02-21 HISTORY — PX: LEFT HEART CATH AND CORONARY ANGIOGRAPHY: CATH118249

## 2019-02-21 LAB — LIPID PANEL
Cholesterol: 136 mg/dL (ref 0–200)
HDL: 40 mg/dL — ABNORMAL LOW (ref >40)
LDL Cholesterol: 67 mg/dL (ref 0–99)
Total CHOL/HDL Ratio: 3.4 RATIO
Triglycerides: 145 mg/dL (ref <150)
VLDL: 29 mg/dL (ref 0–40)

## 2019-02-21 LAB — ECHOCARDIOGRAM COMPLETE
Height: 64 in
Weight: 2303.37 oz

## 2019-02-21 LAB — CBC
HCT: 32.9 % — ABNORMAL LOW (ref 36.0–46.0)
Hemoglobin: 10.8 g/dL — ABNORMAL LOW (ref 12.0–15.0)
MCH: 30 pg (ref 26.0–34.0)
MCHC: 32.8 g/dL (ref 30.0–36.0)
MCV: 91.4 fL (ref 80.0–100.0)
Platelets: 443 10*3/uL — ABNORMAL HIGH (ref 150–400)
RBC: 3.6 MIL/uL — ABNORMAL LOW (ref 3.87–5.11)
RDW: 12.6 % (ref 11.5–15.5)
WBC: 15.9 10*3/uL — ABNORMAL HIGH (ref 4.0–10.5)
nRBC: 0 % (ref 0.0–0.2)

## 2019-02-21 LAB — APTT
aPTT: 48 seconds — ABNORMAL HIGH (ref 24–36)
aPTT: 57 seconds — ABNORMAL HIGH (ref 24–36)

## 2019-02-21 LAB — HEPARIN LEVEL (UNFRACTIONATED)
Heparin Unfractionated: 1.6 IU/mL — ABNORMAL HIGH (ref 0.30–0.70)
Heparin Unfractionated: 1.28 IU/mL — ABNORMAL HIGH (ref 0.30–0.70)

## 2019-02-21 LAB — GLUCOSE, CAPILLARY
Glucose-Capillary: 158 mg/dL — ABNORMAL HIGH (ref 70–99)
Glucose-Capillary: 158 mg/dL — ABNORMAL HIGH (ref 70–99)
Glucose-Capillary: 162 mg/dL — ABNORMAL HIGH (ref 70–99)
Glucose-Capillary: 158 mg/dL — ABNORMAL HIGH (ref 70–99)

## 2019-02-21 SURGERY — LEFT HEART CATH AND CORONARY ANGIOGRAPHY
Anesthesia: LOCAL

## 2019-02-21 MED ORDER — SODIUM CHLORIDE 0.9 % IV SOLN: 250.0000 mL | INTRAVENOUS | Status: DC | PRN

## 2019-02-21 MED ORDER — HYDRALAZINE HCL 20 MG/ML IJ SOLN: 10.0000 mg | INTRAMUSCULAR | Status: AC | PRN

## 2019-02-21 MED ORDER — SODIUM CHLORIDE 0.9 % WEIGHT BASED INFUSION: 1.0000 mL/kg/h | INTRAVENOUS | Status: DC

## 2019-02-21 MED ORDER — FENTANYL CITRATE (PF) 100 MCG/2ML IJ SOLN
INTRAMUSCULAR | Status: AC
  Filled 2019-02-21: qty 2

## 2019-02-21 MED ORDER — SODIUM CHLORIDE 0.9 % WEIGHT BASED INFUSION: 3.0000 mL/kg/h | INTRAVENOUS | Status: DC

## 2019-02-21 MED ORDER — HEPARIN (PORCINE) IN NACL 1000-0.9 UT/500ML-% IV SOLN
INTRAVENOUS | Status: DC | PRN
  Administered 2019-02-21 (×2): 500 mL

## 2019-02-21 MED ORDER — ANGIOPLASTY BOOK
Freq: Once | Status: AC
  Administered 2019-02-22: 03:00:00
  Filled 2019-02-21: qty 1

## 2019-02-21 MED ORDER — SODIUM CHLORIDE 0.9 % IV SOLN
INTRAVENOUS | Status: AC | PRN
  Administered 2019-02-21: 250 mL via INTRAVENOUS

## 2019-02-21 MED ORDER — VERAPAMIL HCL 2.5 MG/ML IV SOLN
INTRAVENOUS | Status: DC | PRN
  Administered 2019-02-21: 12:00:00 10 mL via INTRA_ARTERIAL

## 2019-02-21 MED ORDER — SODIUM CHLORIDE 0.9 % WEIGHT BASED INFUSION: 1.0000 mL/kg/h | INTRAVENOUS | Status: AC

## 2019-02-21 MED ORDER — LABETALOL HCL 5 MG/ML IV SOLN: 10.0000 mg | INTRAVENOUS | Status: AC | PRN

## 2019-02-21 MED ORDER — LIDOCAINE HCL (PF) 1 % IJ SOLN
INTRAMUSCULAR | Status: DC | PRN
  Administered 2019-02-21: 2 mL via INTRADERMAL

## 2019-02-21 MED ORDER — SODIUM CHLORIDE 0.9% FLUSH: 3.0000 mL | INTRAVENOUS | Status: DC | PRN

## 2019-02-21 MED ORDER — MIDAZOLAM HCL 2 MG/2ML IJ SOLN
INTRAMUSCULAR | Status: AC
  Filled 2019-02-21: qty 2

## 2019-02-21 MED ORDER — HEPARIN SODIUM (PORCINE) 1000 UNIT/ML IJ SOLN
INTRAMUSCULAR | Status: DC | PRN
  Administered 2019-02-21: 3500 [IU] via INTRAVENOUS

## 2019-02-21 MED ORDER — SODIUM CHLORIDE 0.9% FLUSH
3.0000 mL | Freq: Two times a day (BID) | INTRAVENOUS | Status: DC
  Administered 2019-02-21: 3 mL via INTRAVENOUS

## 2019-02-21 MED ORDER — LIDOCAINE HCL (PF) 1 % IJ SOLN
INTRAMUSCULAR | Status: AC
  Filled 2019-02-21: qty 30

## 2019-02-21 MED ORDER — FENTANYL CITRATE (PF) 100 MCG/2ML IJ SOLN
INTRAMUSCULAR | Status: DC | PRN
  Administered 2019-02-21: 25 ug via INTRAVENOUS

## 2019-02-21 MED ORDER — ASPIRIN 81 MG PO CHEW: 81.0000 mg | CHEWABLE_TABLET | ORAL | Status: DC

## 2019-02-21 MED ORDER — APIXABAN 5 MG PO TABS
5.0000 mg | ORAL_TABLET | Freq: Two times a day (BID) | ORAL | Status: DC
  Administered 2019-02-21 – 2019-02-23 (×4): 5 mg via ORAL
  Filled 2019-02-21 (×4): qty 1

## 2019-02-21 MED ORDER — VERAPAMIL HCL 2.5 MG/ML IV SOLN
INTRAVENOUS | Status: AC
  Filled 2019-02-21: qty 2

## 2019-02-21 MED ORDER — HEPARIN (PORCINE) IN NACL 1000-0.9 UT/500ML-% IV SOLN
INTRAVENOUS | Status: AC
  Filled 2019-02-21: qty 1000

## 2019-02-21 MED ORDER — MIDAZOLAM HCL 2 MG/2ML IJ SOLN
INTRAMUSCULAR | Status: DC | PRN
  Administered 2019-02-21: 1 mg via INTRAVENOUS

## 2019-02-21 MED ORDER — SODIUM CHLORIDE 0.9% FLUSH
3.0000 mL | Freq: Two times a day (BID) | INTRAVENOUS | Status: DC
  Administered 2019-02-22 (×2): 3 mL via INTRAVENOUS

## 2019-02-21 MED ORDER — SODIUM CHLORIDE 0.9% FLUSH
3.0000 mL | INTRAVENOUS | Status: DC | PRN
  Administered 2019-02-23: 09:00:00 3 mL via INTRAVENOUS
  Filled 2019-02-21: qty 3

## 2019-02-21 SURGICAL SUPPLY — 10 items
CATH 5FR JL3.5 JR4 ANG PIG MP (CATHETERS) ×2 IMPLANT
DEVICE RAD COMP TR BAND LRG (VASCULAR PRODUCTS) ×2 IMPLANT
GLIDESHEATH SLEND SS 6F .021 (SHEATH) ×2 IMPLANT
GUIDEWIRE INQWIRE 1.5J.035X260 (WIRE) ×1 IMPLANT
INQWIRE 1.5J .035X260CM (WIRE) ×2
KIT HEART LEFT (KITS) ×2 IMPLANT
PACK CARDIAC CATHETERIZATION (CUSTOM PROCEDURE TRAY) ×2 IMPLANT
SYR MEDRAD MARK 7 150ML (SYRINGE) ×2 IMPLANT
TRANSDUCER W/STOPCOCK (MISCELLANEOUS) ×2 IMPLANT
TUBING CIL FLEX 10 FLL-RA (TUBING) ×2 IMPLANT

## 2019-02-21 NOTE — H&P (View-Only) (Signed)
Progress Note  Patient Name: Amber Herman Date of Encounter: 02/21/2019  Primary Cardiologist: Dr. Darnell Level, [place]  Subjective   Felt better through the night. Mild chest tightness this am. No palpitations, no orthopnea.  Inpatient Medications    Scheduled Meds: . aspirin EC  81 mg Oral Daily  . carvedilol  25 mg Oral BID WC  . Chlorhexidine Gluconate Cloth  6 each Topical Daily  . influenza vaccine adjuvanted  0.5 mL Intramuscular Tomorrow-1000  . insulin aspart  0-9 Units Subcutaneous TID WC  . insulin detemir  20 Units Subcutaneous QHS  . pantoprazole  40 mg Oral BID  . pravastatin  40 mg Oral QHS  . predniSONE  40 mg Oral Q breakfast   Continuous Infusions: . amiodarone 30 mg/hr (02/20/19 2137)  . heparin 950 Units/hr (02/21/19 0300)  . nitroGLYCERIN 10 mcg/min (02/21/19 0300)   PRN Meds: acetaminophen, HYDROcodone-acetaminophen, methocarbamol, nitroGLYCERIN, ondansetron **OR** ondansetron (ZOFRAN) IV   Vital Signs    Vitals:   02/21/19 0745 02/21/19 0800 02/21/19 0816 02/21/19 0830  BP: 137/81 136/83 129/76 137/60  Pulse: 76 77 65 (!) 106  Resp: 16 (!) 23 16 14   Temp:      TempSrc:      SpO2: 100% 100% 100% 100%  Weight:      Height:        Intake/Output Summary (Last 24 hours) at 02/21/2019 0856 Last data filed at 02/21/2019 0300 Gross per 24 hour  Intake 665.86 ml  Output -  Net 665.86 ml   Last 3 Weights 02/21/2019 02/20/2019 02/04/2019  Weight (lbs) 143 lb 15.4 oz 142 lb 13.7 oz 148 lb  Weight (kg) 65.3 kg 64.8 kg 67.132 kg      Telemetry    Afib rate controlled- Personally Reviewed  Physical Exam    GEN: No acute distress.   Neck: No JVD Cardiac: irreg irreg, no murmurs, rubs, or gallops.  Respiratory: Clear to auscultation bilaterally. GI: Soft, nontender, non-distended  MS: No edema; No deformity. Neuro:  Nonfocal  Psych: Normal affect   Labs    High Sensitivity Troponin:   Recent Labs  Lab 02/19/19 2315 02/20/19  0125 02/20/19 0420 02/20/19 1543 02/20/19 1722  TROPONINIHS 84* 416* 1,279* 751* 718*      Chemistry Recent Labs  Lab 02/19/19 2315 02/20/19 0420  NA 128* 133*  K 4.1 3.5  CL 91* 94*  CO2 23 25  GLUCOSE 343* 158*  BUN 29* 25*  CREATININE 0.97 0.80  CALCIUM 9.4 9.2  PROT  --  6.8  ALBUMIN  --  3.7  AST  --  26  ALT  --  30  ALKPHOS  --  50  BILITOT  --  0.4  GFRNONAA 58* >60  GFRAA >60 >60  ANIONGAP 14 14     Hematology Recent Labs  Lab 02/19/19 2315 02/20/19 0420 02/21/19 0018  WBC 16.0* 15.4* 15.9*  RBC 4.02 3.71* 3.60*  HGB 12.2 11.1* 10.8*  HCT 37.9 34.4* 32.9*  MCV 94.3 92.7 91.4  MCH 30.3 29.9 30.0  MCHC 32.2 32.3 32.8  RDW 12.8 12.7 12.6  PLT 513* 464* 443*    Radiology    Dg Chest Port 1 View  Result Date: 02/19/2019 CLINICAL DATA:  Chest pain EXAM: PORTABLE CHEST 1 VIEW COMPARISON:  11/02/2017 FINDINGS: Mild cardiomegaly with aortic atherosclerosis. No consolidation or effusion. No pneumothorax. IMPRESSION: No active disease.  Mild cardiomegaly. Electronically Signed   By: Madie Reno.D.  On: 02/19/2019 23:27    Cardiac Studies    Echocardiogram 11/02/2017: - Left ventricle: The cavity size was normal. Wall thickness was   increased in a pattern of mild LVH. Systolic function was normal.   The estimated ejection fraction was in the range of 60% to 65%.   Wall motion was normal; there were no regional wall motion   abnormalities. The study was not technically sufficient to allow   evaluation of LV diastolic dysfunction due to atrial   fibrillation. - Aortic valve: Mildly calcified annulus. - Mitral valve: Mildly calcified annulus. There was trivial   regurgitation. - Left atrium: The atrium was at the upper limits of normal in   size. - Right atrium: Central venous pressure (est): 3 mm Hg. - Atrial septum: No defect or patent foramen ovale was identified. - Tricuspid valve: There was trivial regurgitation. - Pulmonary arteries: PA  peak pressure: 36 mm Hg (S). - Pericardium, extracardiac: There was no pericardial effusion.   Lexiscan Myoview 11/03/2017:  Nonspecific ST segment abnormalities seen.  The study is normal. No ischemia or scar.  This is a low risk study.  Nuclear stress EF: 74%.  Patient Profile     73 y.o. female  with a history of PAF, type 2 diabetes mellitus, hypertension, hyperlipidemia, previous TIA now presenting with chest pain and rapid atrial fibrillation with abnormal cardiac enzymes.   Assessment & Plan    NSTEMI in setting of rapid Afib with peak troponin 1200, trending down. Still having mild chest tightness this am on IV heparin/IV NTG/ IV amiodarone. For transfer to [facility] for cardiac cath today.  PAF rate better controlled on IV amiodarone-Eliquis on hold for cath. Last dose yest am  HTN  HLD  History of TIA   For questions or updates, please contact [place] Please consult [email or web address] for contact info under     Signed, Ermalinda Barrios, PA-C 02/21/2019, 8:56 AM     Attending note:  Patient seen and examined.  I reviewed hospital course since consultation yesterday.  Case discussed with Ms. Bonnell Public PA-C.  Ms. Hackenburg reports having a good night, has had some mild chest discomfort this morning, but heart rate in atrial fibrillation is well controlled at this point.  On examination she appears comfortable, afebrile, heart rate in the 80s in atrial fibrillation by telemetry which I personally reviewed, systolic blood pressure in the 120s to 130s.  Lungs exhibit no crackles or wheezing.  Cardiac exam reveals irregularly irregular rhythm without gallop or rub.  Lab work shows decrease in high-sensitivity troponin I down to 751 and then 718, peak was 1279.  LDL is 67.  She is anemic with hemoglobin 10.8.  Current medications include aspirin, Coreg, Pravachol, IV amiodarone, and IV heparin.  Plan is for transfer to [facility] later this morning for diagnostic cardiac  catheterization.  Case already discussed with Dr. Burt Knack yesterday.  She had been on Eliquis for treatment of PAF prior to admission, currently held.  Follow-up echocardiogram also being obtained.  Satira Sark, M.D., F.A.C.C.

## 2019-02-21 NOTE — Progress Notes (Signed)
PROGRESS NOTE    [name]  [name] DOB: 1945/08/07 DOA: 02/19/2019 PCP: Renee Rival, NP   Brief Narrative:  Per HPI: Amber Herman  is a 73 y.o. female, with history of diabetes mellitus type 2 atrial fibrillation on anticoagulation with Eliquis, hypertension, hyperlipidemia, TIA, recent ER visit with neck pain, currently on prednisone taper prescribed by patient's PCP.  Patient says that she woke up to go to bathroom and was feeling short of breath and weak.  She checked her blood glucose was over  500.  Patient also had some shortness of breath with palpitations.  Describes chest discomfort on left side. In the ED patient was found to be in A. fib with RVR, she has allergy to Cardizem so she was given IV Lopressor.  Patient did take her Coreg before coming to the ED. She denies nausea vomiting or diarrhea. Denies abdominal pain or dysuria. No previous history of stroke or seizures. No history of CAD.  9/22: Patient has been feeling better throughout the night without further symptoms.  Heart rate is better controlled.  Troponins are downtrending.  Plans for transfer to [facility] for cardiac catheterization this a.m.  Assessment & Plan:   Active Problems:   NSTEMI (non-ST elevated myocardial infarction) (HCC)   Atrial fibrillation with RVR (HCC)  NSTEMI in setting of rapid atrial fibrillation -Troponins are currently downtrending -Repeat cardiology recommendations with transfer to [facility] planned for cardiac catheterization today -Continue IV heparin, IV nitroglycerin, and IV amiodarone  Atrial fibrillation with RVR in setting of PAF -Controlled on IV amiodarone -Eliquis currently on hold for cardiac catheterization -Continue Coreg 25 mg p.o. twice daily -Patient is allergic to Cardizem  Hypertension -Continue Coreg for now -Chlorthalidone held due to hyponatremia which is now improving  Hyponatremia likely secondary to chlorthalidone use-improving  -Continue to hold chlorthalidone and monitor a.m. labs -Urine and serum osmolarity pending  Type 2 diabetes with improved control -Continue Levemir and current SSI regimen -Initial hyperglycemia secondary to prednisone use for neck pain  Neck pain -Patient on tapering dose protocol with prednisone per orthopedic surgeon -No complaints noted this morning  History of TIA -Continue aspirin and pravastatin   DVT prophylaxis:Heparin drip Code Status: DNR Family Communication: None at bedside Disposition Plan: Transfer to [facility] for Cardiac Catheterization.   Consultants:   Cardiology  Procedures:   None  Antimicrobials:   None   Subjective: Patient seen and evaluated today with no new acute complaints or concerns. No acute concerns or events noted overnight.  She is overall feeling better and continues to have mild chest tightness, but no palpitations or dyspnea.  Objective: Vitals:   02/21/19 0930 02/21/19 0945 02/21/19 1000 02/21/19 1011  BP: (!) 119/59 115/78 116/66 125/68  Pulse: 72 67 69 84  Resp: 14 16 15 19   Temp:      TempSrc:      SpO2: 100% 99% 100% 100%  Weight:      Height:        Intake/Output Summary (Last 24 hours) at 02/21/2019 1037 Last data filed at 02/21/2019 0945 Gross per 24 hour  Intake 759.3 ml  Output -  Net 759.3 ml   Filed Weights   02/20/19 0233 02/21/19 0500  Weight: 64.8 kg 65.3 kg    Examination:  General exam: Appears calm and comfortable  Respiratory system: Clear to auscultation. Respiratory effort normal.  Currently on 2 L nasal cannula. Cardiovascular system: S1 & S2 heard, irregular rate. No JVD, murmurs, rubs,  gallops or clicks. No pedal edema. Gastrointestinal system: Abdomen is nondistended, soft and nontender. No organomegaly or masses felt. Normal bowel sounds heard. Central nervous system: Alert and oriented. No focal neurological deficits. Extremities: Symmetric 5 x 5 power. Skin: No rashes, lesions or ulcers  Psychiatry: Normal affect.    Data Reviewed: I have personally reviewed following labs and imaging studies  CBC: Recent Labs  Lab 02/19/19 2315 02/20/19 0420 02/21/19 0018  WBC 16.0* 15.4* 15.9*  NEUTROABS 13.3*  --   --   HGB 12.2 11.1* 10.8*  HCT 37.9 34.4* 32.9*  MCV 94.3 92.7 91.4  PLT 513* 464* 123456*   Basic Metabolic Panel: Recent Labs  Lab 02/19/19 2315 02/20/19 0420  NA 128* 133*  K 4.1 3.5  CL 91* 94*  CO2 23 25  GLUCOSE 343* 158*  BUN 29* 25*  CREATININE 0.97 0.80  CALCIUM 9.4 9.2   GFR: Estimated Creatinine Clearance: 54.1 mL/min (by C-G formula based on SCr of 0.8 mg/dL). Liver Function Tests: Recent Labs  Lab 02/20/19 0420  AST 26  ALT 30  ALKPHOS 50  BILITOT 0.4  PROT 6.8  ALBUMIN 3.7   No results for input(s): LIPASE, AMYLASE in the last 168 hours. No results for input(s): AMMONIA in the last 168 hours. Coagulation Profile: No results for input(s): INR, PROTIME in the last 168 hours. Cardiac Enzymes: No results for input(s): CKTOTAL, CKMB, CKMBINDEX, TROPONINI in the last 168 hours. BNP (last 3 results) No results for input(s): PROBNP in the last 8760 hours. HbA1C: Recent Labs    02/19/19 2315  HGBA1C 7.3*   CBG: Recent Labs  Lab 02/20/19 0818 02/20/19 1116 02/20/19 1610 02/20/19 2148 02/21/19 0728  GLUCAP 199* 209* 299* 275* 158*   Lipid Profile: Recent Labs    02/21/19 0018  CHOL 136  HDL 40*  LDLCALC 67  TRIG 145  CHOLHDL 3.4   Thyroid Function Tests: No results for input(s): TSH, T4TOTAL, FREET4, T3FREE, THYROIDAB in the last 72 hours. Anemia Panel: No results for input(s): VITAMINB12, FOLATE, FERRITIN, TIBC, IRON, RETICCTPCT in the last 72 hours. Sepsis Labs: No results for input(s): PROCALCITON, LATICACIDVEN in the last 168 hours.  Recent Results (from the past 240 hour(s))  SARS CORONAVIRUS 2 (TAT 6-24 HRS) Nasopharyngeal Nasopharyngeal Swab     Status: None   Collection Time: 02/20/19 12:57 AM   Specimen:  Nasopharyngeal Swab  Result Value Ref Range Status   SARS Coronavirus 2 NEGATIVE NEGATIVE Final    Comment: (NOTE) SARS-CoV-2 target nucleic acids are NOT DETECTED. The SARS-CoV-2 RNA is generally detectable in upper and lower respiratory specimens during the acute phase of infection. Negative results do not preclude SARS-CoV-2 infection, do not rule out co-infections with other pathogens, and should not be used as the sole basis for treatment or other patient management decisions. Negative results must be combined with clinical observations, patient history, and epidemiological information. The expected result is Negative. Fact Sheet for Patients: SugarRoll.be Fact Sheet for Healthcare Providers: https://www.woods-mathews.com/ This test is not yet approved or cleared by the [place] FDA and  has been authorized for detection and/or diagnosis of SARS-CoV-2 by FDA under an Emergency Use Authorization (EUA). This EUA will remain  in effect (meaning this test can be used) for the duration of the COVID-19 declaration under Section 56 4(b)(1) of the Act, 21 U.S.C. section 360bbb-3(b)(1), unless the authorization is terminated or revoked sooner. Performed at [facility], [place] [address] [place], [place] [address]  MRSA PCR Screening     Status: None   Collection Time: 02/20/19  3:01 AM   Specimen: Nasal Mucosa; Nasopharyngeal  Result Value Ref Range Status   MRSA by PCR NEGATIVE NEGATIVE Final    Comment:        The GeneXpert MRSA Assay (FDA approved for NASAL specimens only), is one component of a comprehensive MRSA colonization surveillance program. It is not intended to diagnose MRSA infection nor to guide or monitor treatment for MRSA infections. Performed at [facility], [address] [place], [place] [address]          Radiology Studies: Dg Chest [facility] 1 View  Result Date: 02/19/2019 CLINICAL DATA:  Chest pain  EXAM: PORTABLE CHEST 1 VIEW COMPARISON:  11/02/2017 FINDINGS: Mild cardiomegaly with aortic atherosclerosis. No consolidation or effusion. No pneumothorax. IMPRESSION: No active disease.  Mild cardiomegaly. Electronically Signed   By: Donavan Foil M.D.   On: 02/19/2019 23:27        Scheduled Meds: . aspirin  81 mg Oral Pre-Cath  . aspirin EC  81 mg Oral Daily  . carvedilol  25 mg Oral BID WC  . Chlorhexidine Gluconate Cloth  6 each Topical Daily  . influenza vaccine adjuvanted  0.5 mL Intramuscular Tomorrow-1000  . insulin aspart  0-9 Units Subcutaneous TID WC  . insulin detemir  20 Units Subcutaneous QHS  . pantoprazole  40 mg Oral BID  . pravastatin  40 mg Oral QHS  . predniSONE  40 mg Oral Q breakfast   Continuous Infusions: . sodium chloride    . amiodarone 30 mg/hr (02/21/19 0945)  . heparin 1,100 Units/hr (02/21/19 0945)  . nitroGLYCERIN 20 mcg/min (02/21/19 0945)     LOS: 0 days    Time spent: 30 minutes    Evanny Ellerbe Darleen Crocker, DO [facility] Pager [phone]  If 7PM-7AM, please contact night-coverage [email or web address] Password [facility] 02/21/2019, 10:37 AM

## 2019-02-21 NOTE — Progress Notes (Signed)
Echocardiogram 2D Echocardiogram has been performed.  Amber Herman Bink 02/21/2019, 3:57 PM

## 2019-02-21 NOTE — Progress Notes (Signed)
Patient is to be transferred to [facility] via [place] and in stable condition. Patient and daughter at bedside made aware of transfer and are both agreeable. Patient report given to Eddie Dibbles, RN with Karen Chafe and [place], Cath Lab RN. All questions addressed and answered. Patient departing via [facility] at this time.  Celestia Khat, RN

## 2019-02-21 NOTE — Progress Notes (Signed)
[place] for Heparin Indication: chest pain/ACS  Allergies  Allergen Reactions  . Amlodipine   . Angiotensin Receptor Blockers   . Calcium Channel Blockers   . Nicardipine Hcl   . Simvastatin   . Sulfa Antibiotics Itching and Rash  . Sulfacetamide Sodium Itching and Rash  . Sulfasalazine Itching and Rash    Patient Measurements: Height: 5\' 4"  (162.6 cm) Weight: 143 lb 15.4 oz (65.3 kg) IBW/kg (Calculated) : 54.7 HEPARIN DW (KG): 64.8  Vital Signs: Temp: 98.1 F (36.7 C) (09/22 0729) Temp Source: Oral (09/22 0729) BP: 137/60 (09/22 0830) Pulse Rate: 106 (09/22 0830)  Labs: Recent Labs    02/19/19 2315  02/20/19 0420 02/20/19 1543 02/20/19 1722 02/21/19 0018 02/21/19 0818  HGB 12.2  --  11.1*  --   --  10.8*  --   HCT 37.9  --  34.4*  --   --  32.9*  --   PLT 513*  --  464*  --   --  443*  --   APTT  --   --   --  29  --  48* 57*  HEPARINUNFRC  --   --   --  >2.20*  --  1.60*  --   CREATININE 0.97  --  0.80  --   --   --   --   TROPONINIHS 84*   < > 1,279* 751* 718*  --   --    < > = values in this interval not displayed.    Estimated Creatinine Clearance: 54.1 mL/min (by C-G formula based on SCr of 0.8 mg/dL).   Medical History: Past Medical History:  Diagnosis Date  . Allergic rhinitis   . Anemia   . Atrial fibrillation ([place])    Diagnosed 2013  . DM (diabetes mellitus), type 2 ([place])   . GERD (gastroesophageal reflux disease)   . Hyperlipidemia   . Hypertension   . Nephrolithiasis   . Transient ischemic attack (TIA)   . Vitamin D deficiency     Medications:  Medications Prior to Admission  Medication Sig Dispense Refill Last Dose  . acetaminophen (TYLENOL) 325 MG tablet Take 2 tablets (650 mg total) by mouth every 8 (eight) hours as needed. 60 tablet 0 Past Week at Unknown time  . apixaban (ELIQUIS) 5 MG TABS tablet Take 1 tablet (5 mg total) by mouth 2 (two) times daily. 60 tablet 1 02/19/2019 at 0900pm  .  aspirin EC 81 MG tablet Take 1 tablet (81 mg total) by mouth daily. 30 tablet 1 02/19/2019 at Unknown time  . carvedilol (COREG) 25 MG tablet Take 1 tablet (25 mg total) by mouth 2 (two) times daily with a meal. 60 tablet 0 02/19/2019 at Unknown time  . chlorthalidone (HYGROTON) 50 MG tablet Take 50 mg by mouth daily.    02/19/2019 at Unknown time  . cyclobenzaprine (FLEXERIL) 5 MG tablet Take 5 mg by mouth 3 (three) times daily as needed (pain).    unknown at Unknown time  . FEROSUL 325 (65 Fe) MG tablet Take 325 mg by mouth daily.    02/19/2019 at Unknown time  . losartan (COZAAR) 100 MG tablet Take 100 mg by mouth daily.    02/19/2019 at Unknown time  . metFORMIN (GLUCOPHAGE) 850 MG tablet Take 850 mg by mouth 2 (two) times daily with a meal.    02/19/2019 at Unknown time  . methocarbamol (ROBAXIN) 500 MG tablet Take 1 tablet (500 mg total)  by mouth every 8 (eight) hours as needed for muscle spasms. 30 tablet 0 Past Week at Unknown time  . Potassium Chloride Crys ER (KLOR-CON M20 PO) Take 1 tablet by mouth daily.   02/19/2019 at Unknown time  . pravastatin (PRAVACHOL) 40 MG tablet Take 40 mg by mouth at bedtime.    02/19/2019 at Unknown time  . predniSONE (DELTASONE) 20 MG tablet 3 tabs po daily x 3 days, then 2 tabs x 3 days, then 1.5 tabs x 3 days, then 1 tab x 3 days, then 0.5 tabs x 3 days 27 tablet 0 02/19/2019 at Unknown time  . traMADol (ULTRAM) 50 MG tablet Take 50 mg by mouth every 8 (eight) hours as needed for moderate pain.   unknown  . TRESIBA FLEXTOUCH 200 UNIT/ML SOPN Inject 36 Units into the skin at bedtime.    02/19/2019 at 0600pm  . triamcinolone cream (KENALOG) 0.1 % Apply 1 application topically See admin instructions. 1 application to affected area twice daily, Mix 1:1 with Eucerin externally   02/19/2019 at Unknown time  . vitamin B-12 (CYANOCOBALAMIN) 1000 MCG tablet Take 1,000 mcg by mouth daily.    02/19/2019 at Unknown time    Assessment: Patient admitted secondary to chest pain.  Cardiology plans to transfer to [facility] for cath in AM. Patient on on eliquis chronically for afib and last dose was given this AM at 0823. Pharmacy asked to transition to heparin 6 hours after eliquis dose given. Discussed with PA Estella Husk. With rapid afib nonstemi, wanted to start heparin earlier(6 hours) than 12 hours after last dose of eliquis.  Will order APTT and heparin levels to monitor.   Initial aPTT still subtherapeutic at 57 seconds, HL uncorrelated  Goal of Therapy:  Heparin level 0.3-0.7 units/ml aPTT 66-102 seconds Monitor platelets by anticoagulation protocol: Yes   Plan:  Increase heparin gtt to 1100 units/hr F/u 6 hour aPTT/HL, cath plans  Thomasenia Sales, PharmD, MBA, BCGP Clinical Pharmacist  02/21/2019 8:52 AM

## 2019-02-21 NOTE — Progress Notes (Addendum)
[place] for Heparin Indication: chest pain/ACS  Allergies  Allergen Reactions  . Amlodipine   . Angiotensin Receptor Blockers   . Calcium Channel Blockers   . Nicardipine Hcl   . Simvastatin   . Sulfa Antibiotics Itching and Rash  . Sulfacetamide Sodium Itching and Rash  . Sulfasalazine Itching and Rash    Patient Measurements: Height: 5\' 4"  (162.6 cm) Weight: 142 lb 13.7 oz (64.8 kg) IBW/kg (Calculated) : 54.7 HEPARIN DW (KG): 64.8  Vital Signs: Temp: 98.2 F (36.8 C) (09/22 0000) Temp Source: Oral (09/22 0000) BP: 111/78 (09/22 0015) Pulse Rate: 63 (09/22 0015)  Labs: Recent Labs    02/19/19 2315  02/20/19 0420 02/20/19 1543 02/20/19 1722 02/21/19 0018  HGB 12.2  --  11.1*  --   --  10.8*  HCT 37.9  --  34.4*  --   --  32.9*  PLT 513*  --  464*  --   --  443*  APTT  --   --   --  29  --  48*  HEPARINUNFRC  --   --   --  >2.20*  --  1.60*  CREATININE 0.97  --  0.80  --   --   --   TROPONINIHS 84*   < > 1,279* 751* 718*  --    < > = values in this interval not displayed.    Estimated Creatinine Clearance: 54.1 mL/min (by C-G formula based on SCr of 0.8 mg/dL).   Medical History: Past Medical History:  Diagnosis Date  . Allergic rhinitis   . Anemia   . Atrial fibrillation ([place])    Diagnosed 2013  . DM (diabetes mellitus), type 2 ([place])   . GERD (gastroesophageal reflux disease)   . Hyperlipidemia   . Hypertension   . Nephrolithiasis   . Transient ischemic attack (TIA)   . Vitamin D deficiency     Medications:  Medications Prior to Admission  Medication Sig Dispense Refill Last Dose  . acetaminophen (TYLENOL) 325 MG tablet Take 2 tablets (650 mg total) by mouth every 8 (eight) hours as needed. 60 tablet 0 Past Week at Unknown time  . apixaban (ELIQUIS) 5 MG TABS tablet Take 1 tablet (5 mg total) by mouth 2 (two) times daily. 60 tablet 1 02/19/2019 at 0900pm  . aspirin EC 81 MG tablet Take 1 tablet (81 mg total)  by mouth daily. 30 tablet 1 02/19/2019 at Unknown time  . carvedilol (COREG) 25 MG tablet Take 1 tablet (25 mg total) by mouth 2 (two) times daily with a meal. 60 tablet 0 02/19/2019 at Unknown time  . chlorthalidone (HYGROTON) 50 MG tablet Take 50 mg by mouth daily.    02/19/2019 at Unknown time  . cyclobenzaprine (FLEXERIL) 5 MG tablet Take 5 mg by mouth 3 (three) times daily as needed (pain).    unknown at Unknown time  . FEROSUL 325 (65 Fe) MG tablet Take 325 mg by mouth daily.    02/19/2019 at Unknown time  . losartan (COZAAR) 100 MG tablet Take 100 mg by mouth daily.    02/19/2019 at Unknown time  . metFORMIN (GLUCOPHAGE) 850 MG tablet Take 850 mg by mouth 2 (two) times daily with a meal.    02/19/2019 at Unknown time  . methocarbamol (ROBAXIN) 500 MG tablet Take 1 tablet (500 mg total) by mouth every 8 (eight) hours as needed for muscle spasms. 30 tablet 0 Past Week at Unknown time  .  Potassium Chloride Crys ER (KLOR-CON M20 PO) Take 1 tablet by mouth daily.   02/19/2019 at Unknown time  . pravastatin (PRAVACHOL) 40 MG tablet Take 40 mg by mouth at bedtime.    02/19/2019 at Unknown time  . predniSONE (DELTASONE) 20 MG tablet 3 tabs po daily x 3 days, then 2 tabs x 3 days, then 1.5 tabs x 3 days, then 1 tab x 3 days, then 0.5 tabs x 3 days 27 tablet 0 02/19/2019 at Unknown time  . traMADol (ULTRAM) 50 MG tablet Take 50 mg by mouth every 8 (eight) hours as needed for moderate pain.   unknown  . TRESIBA FLEXTOUCH 200 UNIT/ML SOPN Inject 36 Units into the skin at bedtime.    02/19/2019 at 0600pm  . triamcinolone cream (KENALOG) 0.1 % Apply 1 application topically See admin instructions. 1 application to affected area twice daily, Mix 1:1 with Eucerin externally   02/19/2019 at Unknown time  . vitamin B-12 (CYANOCOBALAMIN) 1000 MCG tablet Take 1,000 mcg by mouth daily.    02/19/2019 at Unknown time    Assessment: Patient admitted secondary to chest pain. Cardiology plans to transfer to [facility] for cath in  AM. Patient on on eliquis chronically for afib and last dose was given this AM at 0823. Pharmacy asked to transition to heparin 6 hours after eliquis dose given. Discussed with PA Estella Husk. With rapid afib nonstemi, wanted to start heparin earlier(6 hours) than 12 hours after last dose of eliquis.  Will order APTT and heparin levels to monitor.   Initial aPTT subtherapeutic at 48 seconds, HL uncorrelated  Goal of Therapy:  Heparin level 0.3-0.7 units/ml aPTT 66-102 seconds Monitor platelets by anticoagulation protocol: Yes   Plan:  Increase heparin gtt to 950 units/hr F/u 6 hour aPTT/HL, cath plans  Bertis Ruddy, PharmD Clinical Pharmacist Please check AMION for all [place] numbers 02/21/2019 1:47 AM

## 2019-02-21 NOTE — Interval H&P Note (Signed)
History and Physical Interval Note:  02/21/2019 11:55 AM  Amber Herman  has presented today for surgery, with the diagnosis of chest pain.  The various methods of treatment have been discussed with the patient and family. After consideration of risks, benefits and other options for treatment, the patient has consented to  Procedure(s): LEFT HEART CATH AND CORONARY ANGIOGRAPHY (N/A) as a surgical intervention.  The patient's history has been reviewed, patient examined, no change in status, stable for surgery.  I have reviewed the patient's chart and labs.  Questions were answered to the patient's satisfaction.     [name]

## 2019-02-21 NOTE — Progress Notes (Addendum)
Progress Note  Patient Name: Amber Herman Date of Encounter: 02/21/2019  Primary Cardiologist: Dr. Darnell Level, [place]  Subjective   Felt better through the night. Mild chest tightness this am. No palpitations, no orthopnea.  Inpatient Medications    Scheduled Meds: . aspirin EC  81 mg Oral Daily  . carvedilol  25 mg Oral BID WC  . Chlorhexidine Gluconate Cloth  6 each Topical Daily  . influenza vaccine adjuvanted  0.5 mL Intramuscular Tomorrow-1000  . insulin aspart  0-9 Units Subcutaneous TID WC  . insulin detemir  20 Units Subcutaneous QHS  . pantoprazole  40 mg Oral BID  . pravastatin  40 mg Oral QHS  . predniSONE  40 mg Oral Q breakfast   Continuous Infusions: . amiodarone 30 mg/hr (02/20/19 2137)  . heparin 950 Units/hr (02/21/19 0300)  . nitroGLYCERIN 10 mcg/min (02/21/19 0300)   PRN Meds: acetaminophen, HYDROcodone-acetaminophen, methocarbamol, nitroGLYCERIN, ondansetron **OR** ondansetron (ZOFRAN) IV   Vital Signs    Vitals:   02/21/19 0745 02/21/19 0800 02/21/19 0816 02/21/19 0830  BP: 137/81 136/83 129/76 137/60  Pulse: 76 77 65 (!) 106  Resp: 16 (!) 23 16 14   Temp:      TempSrc:      SpO2: 100% 100% 100% 100%  Weight:      Height:        Intake/Output Summary (Last 24 hours) at 02/21/2019 0856 Last data filed at 02/21/2019 0300 Gross per 24 hour  Intake 665.86 ml  Output -  Net 665.86 ml   Last 3 Weights 02/21/2019 02/20/2019 02/04/2019  Weight (lbs) 143 lb 15.4 oz 142 lb 13.7 oz 148 lb  Weight (kg) 65.3 kg 64.8 kg 67.132 kg      Telemetry    Afib rate controlled- Personally Reviewed  Physical Exam    GEN: No acute distress.   Neck: No JVD Cardiac: irreg irreg, no murmurs, rubs, or gallops.  Respiratory: Clear to auscultation bilaterally. GI: Soft, nontender, non-distended  MS: No edema; No deformity. Neuro:  Nonfocal  Psych: Normal affect   Labs    High Sensitivity Troponin:   Recent Labs  Lab 02/19/19 2315 02/20/19  0125 02/20/19 0420 02/20/19 1543 02/20/19 1722  TROPONINIHS 84* 416* 1,279* 751* 718*      Chemistry Recent Labs  Lab 02/19/19 2315 02/20/19 0420  NA 128* 133*  K 4.1 3.5  CL 91* 94*  CO2 23 25  GLUCOSE 343* 158*  BUN 29* 25*  CREATININE 0.97 0.80  CALCIUM 9.4 9.2  PROT  --  6.8  ALBUMIN  --  3.7  AST  --  26  ALT  --  30  ALKPHOS  --  50  BILITOT  --  0.4  GFRNONAA 58* >60  GFRAA >60 >60  ANIONGAP 14 14     Hematology Recent Labs  Lab 02/19/19 2315 02/20/19 0420 02/21/19 0018  WBC 16.0* 15.4* 15.9*  RBC 4.02 3.71* 3.60*  HGB 12.2 11.1* 10.8*  HCT 37.9 34.4* 32.9*  MCV 94.3 92.7 91.4  MCH 30.3 29.9 30.0  MCHC 32.2 32.3 32.8  RDW 12.8 12.7 12.6  PLT 513* 464* 443*    Radiology    Dg Chest Port 1 View  Result Date: 02/19/2019 CLINICAL DATA:  Chest pain EXAM: PORTABLE CHEST 1 VIEW COMPARISON:  11/02/2017 FINDINGS: Mild cardiomegaly with aortic atherosclerosis. No consolidation or effusion. No pneumothorax. IMPRESSION: No active disease.  Mild cardiomegaly. Electronically Signed   By: Madie Reno.D.  On: 02/19/2019 23:27    Cardiac Studies    Echocardiogram 11/02/2017: - Left ventricle: The cavity size was normal. Wall thickness was   increased in a pattern of mild LVH. Systolic function was normal.   The estimated ejection fraction was in the range of 60% to 65%.   Wall motion was normal; there were no regional wall motion   abnormalities. The study was not technically sufficient to allow   evaluation of LV diastolic dysfunction due to atrial   fibrillation. - Aortic valve: Mildly calcified annulus. - Mitral valve: Mildly calcified annulus. There was trivial   regurgitation. - Left atrium: The atrium was at the upper limits of normal in   size. - Right atrium: Central venous pressure (est): 3 mm Hg. - Atrial septum: No defect or patent foramen ovale was identified. - Tricuspid valve: There was trivial regurgitation. - Pulmonary arteries: PA  peak pressure: 36 mm Hg (S). - Pericardium, extracardiac: There was no pericardial effusion.   Lexiscan Myoview 11/03/2017:  Nonspecific ST segment abnormalities seen.  The study is normal. No ischemia or scar.  This is a low risk study.  Nuclear stress EF: 74%.  Patient Profile     73 y.o. female  with a history of PAF, type 2 diabetes mellitus, hypertension, hyperlipidemia, previous TIA now presenting with chest pain and rapid atrial fibrillation with abnormal cardiac enzymes.   Assessment & Plan    NSTEMI in setting of rapid Afib with peak troponin 1200, trending down. Still having mild chest tightness this am on IV heparin/IV NTG/ IV amiodarone. For transfer to [facility] for cardiac cath today.  PAF rate better controlled on IV amiodarone-Eliquis on hold for cath. Last dose yest am  HTN  HLD  History of TIA   For questions or updates, please contact [place] Please consult [email or web address] for contact info under     Signed, Ermalinda Barrios, PA-C 02/21/2019, 8:56 AM     Attending note:  Patient seen and examined.  I reviewed hospital course since consultation yesterday.  Case discussed with Ms. [place] Public PA-C.  Ms. Revoir reports having a good night, has had some mild chest discomfort this morning, but heart rate in atrial fibrillation is well controlled at this point.  On examination she appears comfortable, afebrile, heart rate in the 80s in atrial fibrillation by telemetry which I personally reviewed, systolic blood pressure in the 120s to 130s.  Lungs exhibit no crackles or wheezing.  Cardiac exam reveals irregularly irregular rhythm without gallop or rub.  Lab work shows decrease in high-sensitivity troponin I down to 751 and then 718, peak was 1279.  LDL is 67.  She is anemic with hemoglobin 10.8.  Current medications include aspirin, Coreg, Pravachol, IV amiodarone, and IV heparin.  Plan is for transfer to [facility] later this morning for diagnostic cardiac  catheterization.  Case already discussed with Dr. Burt Knack yesterday.  She had been on Eliquis for treatment of PAF prior to admission, currently held.  Follow-up echocardiogram also being obtained.  Satira Sark, M.D., F.A.C.C.

## 2019-02-22 ENCOUNTER — Inpatient Hospital Stay (HOSPITAL_COMMUNITY): Payer: Medicare Other | Admitting: Anesthesiology

## 2019-02-22 ENCOUNTER — Encounter (HOSPITAL_COMMUNITY)
Admission: EM | Disposition: A | Discharge status: Home / Self Care | Payer: Self-pay | Source: Home / Self Care | Attending: Internal Medicine

## 2019-02-22 ENCOUNTER — Encounter (HOSPITAL_COMMUNITY): Payer: Self-pay | Admitting: Cardiovascular Disease

## 2019-02-22 ENCOUNTER — Inpatient Hospital Stay (HOSPITAL_COMMUNITY): Payer: Medicare Other

## 2019-02-22 DIAGNOSIS — I4891 Unspecified atrial fibrillation: Secondary | ICD-10-CM

## 2019-02-22 DIAGNOSIS — I340 Nonrheumatic mitral (valve) insufficiency: Secondary | ICD-10-CM

## 2019-02-22 DIAGNOSIS — I4819 Other persistent atrial fibrillation: Secondary | ICD-10-CM

## 2019-02-22 DIAGNOSIS — I248 Other forms of acute ischemic heart disease: Secondary | ICD-10-CM

## 2019-02-22 HISTORY — PX: CARDIOVERSION: SHX1299

## 2019-02-22 HISTORY — PX: TEE WITHOUT CARDIOVERSION: SHX5443

## 2019-02-22 LAB — BASIC METABOLIC PANEL
Anion gap: 11 (ref 5–15)
BUN: 19 mg/dL (ref 8–23)
CO2: 26 mmol/L (ref 22–32)
Calcium: 8.4 mg/dL — ABNORMAL LOW (ref 8.9–10.3)
Chloride: 95 mmol/L — ABNORMAL LOW (ref 98–111)
Creatinine, Ser: 0.87 mg/dL (ref 0.44–1.00)
GFR calc Af Amer: >60 mL/min (ref >60)
GFR calc non Af Amer: >60 mL/min (ref >60)
Glucose, Bld: 102 mg/dL — ABNORMAL HIGH (ref 70–99)
Potassium: 3.1 mmol/L — ABNORMAL LOW (ref 3.5–5.1)
Sodium: 132 mmol/L — ABNORMAL LOW (ref 135–145)

## 2019-02-22 LAB — GLUCOSE, CAPILLARY
Glucose-Capillary: 107 mg/dL — ABNORMAL HIGH (ref 70–99)
Glucose-Capillary: 69 mg/dL — ABNORMAL LOW (ref 70–99)
Glucose-Capillary: 126 mg/dL — ABNORMAL HIGH (ref 70–99)
Glucose-Capillary: 196 mg/dL — ABNORMAL HIGH (ref 70–99)
Glucose-Capillary: 182 mg/dL — ABNORMAL HIGH (ref 70–99)

## 2019-02-22 SURGERY — ECHOCARDIOGRAM, TRANSESOPHAGEAL
Anesthesia: General

## 2019-02-22 MED ORDER — BUTAMBEN-TETRACAINE-BENZOCAINE 2-2-14 % EX AERO
INHALATION_SPRAY | CUTANEOUS | Status: DC | PRN
  Administered 2019-02-22: 13:00:00 2 via TOPICAL

## 2019-02-22 MED ORDER — SODIUM CHLORIDE 0.9 % IV SOLN
INTRAVENOUS | Status: DC
  Administered 2019-02-22 (×2): via INTRAVENOUS

## 2019-02-22 MED ORDER — POTASSIUM CHLORIDE CRYS ER 20 MEQ PO TBCR
40.0000 meq | EXTENDED_RELEASE_TABLET | Freq: Once | ORAL | Status: AC
  Administered 2019-02-22: 40 meq via ORAL
  Filled 2019-02-22: qty 2

## 2019-02-22 MED ORDER — PROPOFOL 500 MG/50ML IV EMUL
INTRAVENOUS | Status: DC | PRN
  Administered 2019-02-22: 80 ug/kg/min via INTRAVENOUS

## 2019-02-22 MED ORDER — PROPOFOL 10 MG/ML IV BOLUS
INTRAVENOUS | Status: DC | PRN
  Administered 2019-02-22 (×2): 15 mg via INTRAVENOUS

## 2019-02-22 MED ORDER — ONDANSETRON HCL 4 MG/2ML IJ SOLN
INTRAMUSCULAR | Status: DC | PRN
  Administered 2019-02-22: 4 mg via INTRAVENOUS

## 2019-02-22 MED ORDER — EPHEDRINE SULFATE-NACL 50-0.9 MG/10ML-% IV SOSY
PREFILLED_SYRINGE | INTRAVENOUS | Status: DC | PRN
  Administered 2019-02-22: 10 mg via INTRAVENOUS

## 2019-02-22 MED ORDER — PHENYLEPHRINE 40 MCG/ML (10ML) SYRINGE FOR IV PUSH (FOR BLOOD PRESSURE SUPPORT)
PREFILLED_SYRINGE | INTRAVENOUS | Status: DC | PRN
  Administered 2019-02-22 (×2): 120 ug via INTRAVENOUS

## 2019-02-22 MED ORDER — DEXTROSE 50 % IV SOLN
12.5000 g | INTRAVENOUS | Status: AC
  Administered 2019-02-22: 11:00:00 12.5 g via INTRAVENOUS
  Filled 2019-02-22: qty 50

## 2019-02-22 NOTE — Anesthesia Postprocedure Evaluation (Signed)
Anesthesia Post Note  Patient: Amber Herman  Procedure(s) Performed: TRANSESOPHAGEAL ECHOCARDIOGRAM (TEE) (N/A ) CARDIOVERSION (N/A )     Patient location during evaluation: PACU Anesthesia Type: General Level of consciousness: awake and alert and oriented Pain management: pain level controlled Vital Signs Assessment: post-procedure vital signs reviewed and stable Respiratory status: spontaneous breathing, nonlabored ventilation and respiratory function stable Cardiovascular status: blood pressure returned to baseline and stable Postop Assessment: no apparent nausea or vomiting Anesthetic complications: no    Last Vitals:  Vitals:   02/22/19 1325 02/22/19 1335  BP: (!) 94/28 (!) 117/36  Pulse: (!) 50 (!) 49  Resp: 17 16  Temp:    SpO2: 98% 98%    Last Pain:  Vitals:   02/22/19 1335  TempSrc:   PainSc: 0-No pain                 Genevia Bouldin A.

## 2019-02-22 NOTE — Progress Notes (Signed)
Pt CBG 69.  50% Dextrose 12.5g given via IV.   CBG rechecked at 126.  Pt going to Endo for TEE.  Endo notified.

## 2019-02-22 NOTE — Anesthesia Procedure Notes (Signed)
Procedure Name: MAC Date/Time: 02/22/2019 12:52 PM Performed by: Elayne Snare, CRNA Pre-anesthesia Checklist: Patient identified, Emergency Drugs available, Suction available and Patient being monitored Patient Re-evaluated:Patient Re-evaluated prior to induction Oxygen Delivery Method: Nasal cannula

## 2019-02-22 NOTE — Interval H&P Note (Signed)
History and Physical Interval Note:  02/22/2019 12:45 PM  [name]  has presented today for surgery, with the diagnosis of AFIB.  The various methods of treatment have been discussed with the patient and family. After consideration of risks, benefits and other options for treatment, the patient has consented to  Procedure(s): TRANSESOPHAGEAL ECHOCARDIOGRAM (TEE) (N/A) CARDIOVERSION (N/A) as a surgical intervention.  The patient's history has been reviewed, patient examined, no change in status, stable for surgery.  I have reviewed the patient's chart and labs.  Questions were answered to the patient's satisfaction.     Amber Herman

## 2019-02-22 NOTE — CV Procedure (Signed)
   TRANSESOPHAGEAL ECHOCARDIOGRAM GUIDED DIRECT CURRENT CARDIOVERSION  NAME:  Amber Herman   MRN: [id number] DOB:  15-Nov-1945   ADMIT DATE: 02/19/2019  INDICATIONS: Symptomatic atrial fibrillation  PROCEDURE:   Informed consent was obtained prior to the procedure. The risks, benefits and alternatives for the procedure were discussed and the patient comprehended these risks.  Risks include, but are not limited to, cough, sore throat, vomiting, nausea, somnolence, esophageal and stomach trauma or perforation, bleeding, low blood pressure, aspiration, pneumonia, infection, trauma to the teeth and death.    After a procedural time-out, the oropharynx was anesthetized and the patient was sedated by the anesthesia service. The transesophageal probe was inserted in the esophagus and stomach without difficulty and multiple views were obtained. Anesthesia was monitored by Dr. Royce Macadamia and Leim Fabry, CRNA  COMPLICATIONS:    Complications: No complications Patient tolerated procedure well.  FINDINGS:  LEFT VENTRICLE: EF = 60-65% No regional wall motion abnormalities.  RIGHT VENTRICLE: Normal size and function.   LEFT ATRIUM: No thrombus/mass.  LEFT ATRIAL APPENDAGE: No thrombus/mass.   RIGHT ATRIUM: No thrombus/mass.  AORTIC VALVE:  Trileaflet. No regurgitation.   MITRAL VALVE:    Normal structure. Mild regurgitation.   TRICUSPID VALVE: Normal structure. Mild regurgitation.   PULMONIC VALVE: Grossly normal structure. Trace regurgitation.   INTERATRIAL SEPTUM: No PFO or ASD seen by color Doppler.  PERICARDIUM: No effusion noted.  DESCENDING AORTA: Mild plaque seen   CARDIOVERSION:     Indications:  Symptomatic Atrial Fibrillation  Procedure Details:  Once the TEE was complete, the patient had the defibrillator pads placed in the anterior and posterior position. Once an appropriate level of sedation was confirmed, the patient was cardioverted x 1 with 200J of biphasic  synchronized energy.  The patient converted to NSR.  There were no apparent complications.  The patient had normal neuro status and respiratory status post procedure with vitals stable as recorded elsewhere.  Adequate airway was maintained throughout and vital signs monitored per protocol.  Oswaldo Milian MD [facility]  [address], [place], [place] [address] [phone]   4:04 PM    \

## 2019-02-22 NOTE — H&P (View-Only) (Signed)
Progress Note  Patient Name: Amber Herman Date of Encounter: 02/22/2019  Primary Cardiologist: Dr. Darnell Level, [place]  Subjective   Feels much better today, no chest pain or SOB.   Inpatient Medications    Scheduled Meds: . apixaban  5 mg Oral BID  . aspirin EC  81 mg Oral Daily  . carvedilol  25 mg Oral BID WC  . Chlorhexidine Gluconate Cloth  6 each Topical Daily  . influenza vaccine adjuvanted  0.5 mL Intramuscular Tomorrow-1000  . insulin aspart  0-9 Units Subcutaneous TID WC  . insulin detemir  20 Units Subcutaneous QHS  . pantoprazole  40 mg Oral BID  . pravastatin  40 mg Oral QHS  . predniSONE  40 mg Oral Q breakfast  . sodium chloride flush  3 mL Intravenous Q12H  . sodium chloride flush  3 mL Intravenous Q12H   Continuous Infusions: . sodium chloride    . sodium chloride    . amiodarone 30 mg/hr (02/21/19 2214)   PRN Meds: sodium chloride, sodium chloride, acetaminophen, HYDROcodone-acetaminophen, methocarbamol, nitroGLYCERIN, ondansetron **OR** ondansetron (ZOFRAN) IV, sodium chloride flush, sodium chloride flush   Vital Signs    Vitals:   02/22/19 0238 02/22/19 0339 02/22/19 0637 02/22/19 0702  BP: (!) 92/59 (!) 108/46  127/63  Pulse: 87   72  Resp:      Temp:    97.8 F (36.6 C)  TempSrc:    Oral  SpO2:    98%  Weight:   64.5 kg   Height:        Intake/Output Summary (Last 24 hours) at 02/22/2019 0710 Last data filed at 02/22/2019 [id number] Gross per 24 hour  Intake 573.44 ml  Output 1500 ml  Net -926.56 ml   Last 3 Weights 02/22/2019 02/21/2019 02/20/2019  Weight (lbs) 142 lb 1.6 oz 143 lb 15.4 oz 142 lb 13.7 oz  Weight (kg) 64.456 kg 65.3 kg 64.8 kg      Telemetry    Afib, occ PVCs- Personally Reviewed  Physical Exam    General: Well developed, well nourished, elderly female in no acute distress Head: Eyes PERRLA Normocephalic and atraumatic Lungs: Clear bilaterally to auscultation. Heart: Irreg R&R, S1 S2, without rub or gallop.  No murmur.  Pulses are 2+ & equal. No JVD. Abdomen: Bowel sounds are present, abdomen soft and non-tender without masses or  hernias noted. Msk: Normal strength and tone for age. Extremities: No clubbing, cyanosis or edema.    Skin:  No rashes or lesions noted. Neuro: Alert and oriented X 3. Psych:  Good affect, responds appropriately  Labs    High Sensitivity Troponin:   Recent Labs  Lab 02/19/19 2315 02/20/19 0125 02/20/19 0420 02/20/19 1543 02/20/19 1722  TROPONINIHS 84* 416* 1,279* 751* 718*      Chemistry Recent Labs  Lab 02/19/19 2315 02/20/19 0420 02/22/19 0315  NA 128* 133* 132*  K 4.1 3.5 3.1*  CL 91* 94* 95*  CO2 23 25 26   GLUCOSE 343* 158* 102*  BUN 29* 25* 19  CREATININE 0.97 0.80 0.87  CALCIUM 9.4 9.2 8.4*  PROT  --  6.8  --   ALBUMIN  --  3.7  --   AST  --  26  --   ALT  --  30  --   ALKPHOS  --  50  --   BILITOT  --  0.4  --   GFRNONAA 58* >60 >60  GFRAA >60 >60 >60  ANIONGAP 14  14 11     Hematology Recent Labs  Lab 02/19/19 2315 02/20/19 0420 02/21/19 0018  WBC 16.0* 15.4* 15.9*  RBC 4.02 3.71* 3.60*  HGB 12.2 11.1* 10.8*  HCT 37.9 34.4* 32.9*  MCV 94.3 92.7 91.4  MCH 30.3 29.9 30.0  MCHC 32.2 32.3 32.8  RDW 12.8 12.7 12.6  PLT 513* 464* 443*   Lab Results  Component Value Date   CHOL 136 02/21/2019   HDL 40 (L) 02/21/2019   LDLCALC 67 02/21/2019   TRIG 145 02/21/2019   CHOLHDL 3.4 02/21/2019    Radiology    No results found.  Cardiac Studies   CATH: 02/21/2019  1. 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 Widely patent left main, left circumflex, and RCA 3. Normal LV systolic function and normal LVEDP  Pt with low BP throughout the procedure but awake and stable. Treated with fluid bolus and discontinuation of IV NTG.  Will review with rounding team. Will resume eliquis tonight. Consider TEE/cardioversion tomorrow.   ECHO: 02/21/2019  1. Left ventricular  ejection fraction, by visual estimation, is 60 to 65%. The left ventricle has normal function. Normal left ventricular size. There is mildly increased left ventricular hypertrophy.  2. Left ventricular diastolic function could not be evaluated pattern of LV diastolic filling.  3. Global right ventricle has normal systolic function.The right ventricular size is normal. No increase in right ventricular wall thickness.  4. Left atrial size was normal.  5. Right atrial size was normal.  6. Mild mitral annular calcification.  7. The mitral valve is normal in structure. No evidence of mitral valve regurgitation. No evidence of mitral stenosis.  8. The tricuspid valve is normal in structure. Tricuspid valve regurgitation was not visualized by color flow Doppler.  9. The aortic valve is normal in structure. Aortic valve regurgitation was not visualized by color flow Doppler. Mild aortic valve sclerosis without stenosis. 10. The pulmonic valve was grossly normal. Pulmonic valve regurgitation is not visualized by color flow Doppler. 11. Normal pulmonary artery systolic pressure. 12. The inferior vena cava is normal in size with greater than 50% respiratory variability, suggesting right atrial pressure of 3 mmHg.  Patient Profile     73 y.o. female  with a history of PAF, type 2 diabetes mellitus, hypertension, hyperlipidemia, previous TIA presented 09/20 to [facility] with chest pain and rapid atrial fibrillation with abnormal cardiac enzymes.   Assessment & Plan    NSTEMI  - s/p cath 09/22 w/ mod LAD dz, results above, med rx  - peak trop 1279 - on ASA, BB, pravastatin 40 mg, LDL 67 this admit  PAF - on Coreg 25 mg bid, dose held last pm due to low BP, got this am, may have to decrease the dose to get some on board - got 1 dose Eliquis 09/21 am, 1 dose Eliquis 09/22 pm, will get a dose this am - for TEE/DCCV at 2 pm, Dr Gardiner Rhyme feels Eliquis dosing sufficient - continue amio IV for now, LFTs ok, ck TSH  in am  HTN - BP has been low-nl here, RA pressure 3 - will give some IVF  HLD - on statin  History of TIA - continue Eliquis  Hypokalemia - supplement and follow   For questions or updates, please contact [facility] Please consult [email or web address] for contact info under     Rosaria Ferries, PA-C 02/22/2019 7:10 AM Beeper [phone]

## 2019-02-22 NOTE — Progress Notes (Signed)
  Echocardiogram Echocardiogram Transesophageal has been performed.  Amber Herman R 02/22/2019, 1:20 PM

## 2019-02-22 NOTE — Transfer of Care (Signed)
Immediate Anesthesia Transfer of Care Note  Patient: Amber Herman  Procedure(s) Performed: TRANSESOPHAGEAL ECHOCARDIOGRAM (TEE) (N/A ) CARDIOVERSION (N/A )  Patient Location: Endoscopy Unit  Anesthesia Type:General  Level of Consciousness: awake, alert  and patient cooperative  Airway & Oxygen Therapy: Patient Spontanous Breathing and Patient connected to nasal cannula oxygen  Post-op Assessment: Report given to RN and Post -op Vital signs reviewed and stable  Post vital signs: Reviewed and stable  Last Vitals:  Vitals Value Taken Time  BP    Temp    Pulse    Resp    SpO2      Last Pain:  Vitals:   02/22/19 1230  TempSrc: Temporal  PainSc: 0-No pain      Patients Stated Pain Goal: 0 ([id number])  Complications: No apparent anesthesia complications

## 2019-02-22 NOTE — Progress Notes (Addendum)
Progress Note  Patient Name: Amber Herman Date of Encounter: 02/22/2019  Primary Cardiologist: Dr. Darnell Level, [place]  Subjective   Feels much better today, no chest pain or SOB.   Inpatient Medications    Scheduled Meds: . apixaban  5 mg Oral BID  . aspirin EC  81 mg Oral Daily  . carvedilol  25 mg Oral BID WC  . Chlorhexidine Gluconate Cloth  6 each Topical Daily  . influenza vaccine adjuvanted  0.5 mL Intramuscular Tomorrow-1000  . insulin aspart  0-9 Units Subcutaneous TID WC  . insulin detemir  20 Units Subcutaneous QHS  . pantoprazole  40 mg Oral BID  . pravastatin  40 mg Oral QHS  . predniSONE  40 mg Oral Q breakfast  . sodium chloride flush  3 mL Intravenous Q12H  . sodium chloride flush  3 mL Intravenous Q12H   Continuous Infusions: . sodium chloride    . sodium chloride    . amiodarone 30 mg/hr (02/21/19 2214)   PRN Meds: sodium chloride, sodium chloride, acetaminophen, HYDROcodone-acetaminophen, methocarbamol, nitroGLYCERIN, ondansetron **OR** ondansetron (ZOFRAN) IV, sodium chloride flush, sodium chloride flush   Vital Signs    Vitals:   02/22/19 0238 02/22/19 0339 02/22/19 0637 02/22/19 0702  BP: (!) 92/59 (!) 108/46  127/63  Pulse: 87   72  Resp:      Temp:    97.8 F (36.6 C)  TempSrc:    Oral  SpO2:    98%  Weight:   64.5 kg   Height:        Intake/Output Summary (Last 24 hours) at 02/22/2019 0710 Last data filed at 02/22/2019 [id number] Gross per 24 hour  Intake 573.44 ml  Output 1500 ml  Net -926.56 ml   Last 3 Weights 02/22/2019 02/21/2019 02/20/2019  Weight (lbs) 142 lb 1.6 oz 143 lb 15.4 oz 142 lb 13.7 oz  Weight (kg) 64.456 kg 65.3 kg 64.8 kg      Telemetry    Afib, occ PVCs- Personally Reviewed  Physical Exam    General: Well developed, well nourished, elderly female in no acute distress Head: Eyes PERRLA Normocephalic and atraumatic Lungs: Clear bilaterally to auscultation. Heart: Irreg R&R, S1 S2, without rub or gallop.  No murmur.  Pulses are 2+ & equal. No JVD. Abdomen: Bowel sounds are present, abdomen soft and non-tender without masses or  hernias noted. Msk: Normal strength and tone for age. Extremities: No clubbing, cyanosis or edema.    Skin:  No rashes or lesions noted. Neuro: Alert and oriented X 3. Psych:  Good affect, responds appropriately  Labs    High Sensitivity Troponin:   Recent Labs  Lab 02/19/19 2315 02/20/19 0125 02/20/19 0420 02/20/19 1543 02/20/19 1722  TROPONINIHS 84* 416* 1,279* 751* 718*      Chemistry Recent Labs  Lab 02/19/19 2315 02/20/19 0420 02/22/19 0315  NA 128* 133* 132*  K 4.1 3.5 3.1*  CL 91* 94* 95*  CO2 23 25 26   GLUCOSE 343* 158* 102*  BUN 29* 25* 19  CREATININE 0.97 0.80 0.87  CALCIUM 9.4 9.2 8.4*  PROT  --  6.8  --   ALBUMIN  --  3.7  --   AST  --  26  --   ALT  --  30  --   ALKPHOS  --  50  --   BILITOT  --  0.4  --   GFRNONAA 58* >60 >60  GFRAA >60 >60 >60  ANIONGAP 14  14 11     Hematology Recent Labs  Lab 02/19/19 2315 02/20/19 0420 02/21/19 0018  WBC 16.0* 15.4* 15.9*  RBC 4.02 3.71* 3.60*  HGB 12.2 11.1* 10.8*  HCT 37.9 34.4* 32.9*  MCV 94.3 92.7 91.4  MCH 30.3 29.9 30.0  MCHC 32.2 32.3 32.8  RDW 12.8 12.7 12.6  PLT 513* 464* 443*   Lab Results  Component Value Date   CHOL 136 02/21/2019   HDL 40 (L) 02/21/2019   LDLCALC 67 02/21/2019   TRIG 145 02/21/2019   CHOLHDL 3.4 02/21/2019    Radiology    No results found.  Cardiac Studies   CATH: 02/21/2019  1. 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 Widely patent left main, left circumflex, and RCA 3. Normal LV systolic function and normal LVEDP  Pt with low BP throughout the procedure but awake and stable. Treated with fluid bolus and discontinuation of IV NTG.  Will review with rounding team. Will resume eliquis tonight. Consider TEE/cardioversion tomorrow.   ECHO: 02/21/2019  1. Left ventricular  ejection fraction, by visual estimation, is 60 to 65%. The left ventricle has normal function. Normal left ventricular size. There is mildly increased left ventricular hypertrophy.  2. Left ventricular diastolic function could not be evaluated pattern of LV diastolic filling.  3. Global right ventricle has normal systolic function.The right ventricular size is normal. No increase in right ventricular wall thickness.  4. Left atrial size was normal.  5. Right atrial size was normal.  6. Mild mitral annular calcification.  7. The mitral valve is normal in structure. No evidence of mitral valve regurgitation. No evidence of mitral stenosis.  8. The tricuspid valve is normal in structure. Tricuspid valve regurgitation was not visualized by color flow Doppler.  9. The aortic valve is normal in structure. Aortic valve regurgitation was not visualized by color flow Doppler. Mild aortic valve sclerosis without stenosis. 10. The pulmonic valve was grossly normal. Pulmonic valve regurgitation is not visualized by color flow Doppler. 11. Normal pulmonary artery systolic pressure. 12. The inferior vena cava is normal in size with greater than 50% respiratory variability, suggesting right atrial pressure of 3 mmHg.  Patient Profile     73 y.o. female  with a history of PAF, type 2 diabetes mellitus, hypertension, hyperlipidemia, previous TIA presented 09/20 to [facility] with chest pain and rapid atrial fibrillation with abnormal cardiac enzymes.   Assessment & Plan    NSTEMI  - s/p cath 09/22 w/ mod LAD dz, results above, med rx  - peak trop 1279 - on ASA, BB, pravastatin 40 mg, LDL 67 this admit  PAF - on Coreg 25 mg bid, dose held last pm due to low BP, got this am, may have to decrease the dose to get some on board - got 1 dose Eliquis 09/21 am, 1 dose Eliquis 09/22 pm, will get a dose this am - for TEE/DCCV at 2 pm, Dr Gardiner Rhyme feels Eliquis dosing sufficient - continue amio IV for now, LFTs ok, ck TSH  in am  HTN - BP has been low-nl here, RA pressure 3 - will give some IVF  HLD - on statin  History of TIA - continue Eliquis  Hypokalemia - supplement and follow   For questions or updates, please contact [facility] Please consult [email or web address] for contact info under     Rosaria Ferries, PA-C 02/22/2019 7:10 AM Beeper [phone]

## 2019-02-22 NOTE — Anesthesia Preprocedure Evaluation (Addendum)
Anesthesia Evaluation  Patient identified by MRN, date of birth, ID band Patient awake    Reviewed: Allergy & Precautions, NPO status , Patient's Chart, lab work & pertinent test results  Airway Mallampati: II  TM Distance: >3 FB Neck ROM: Full    Dental no notable dental hx. (+) Teeth Intact   Pulmonary neg pulmonary ROS,    Pulmonary exam normal breath sounds clear to auscultation       Cardiovascular hypertension, Pt. on medications and Pt. on home beta blockers + Past MI  Normal cardiovascular exam+ dysrhythmias Atrial Fibrillation  Rhythm:Regular Rate:Normal     Neuro/Psych TIAnegative psych ROS   GI/Hepatic Neg liver ROS, GERD  Medicated and Controlled,  Endo/Other  diabetes, Well Controlled, Type 2  Renal/GU Renal InsufficiencyRenal disease  negative genitourinary   Musculoskeletal negative musculoskeletal ROS (+)   Abdominal   Peds  Hematology  (+) anemia , On heparin gtt Eliquis- last dose 02/19/2019   Anesthesia Other Findings   Reproductive/Obstetrics                             Anesthesia Physical Anesthesia Plan  ASA: III  Anesthesia Plan: General   Post-op Pain Management:    Induction: Intravenous  PONV Risk Score and Plan: 3 and Propofol infusion, Ondansetron and Treatment may vary due to age or medical condition  Airway Management Planned: Natural Airway and Nasal Cannula  Additional Equipment:   Intra-op Plan:   Post-operative Plan:   Informed Consent: I have reviewed the patients History and Physical, chart, labs and discussed the procedure including the risks, benefits and alternatives for the proposed anesthesia with the patient or authorized representative who has indicated his/her understanding and acceptance.    Discussed DNR with patient and Suspend DNR.   Dental advisory given  Plan Discussed with: CRNA and Surgeon  Anesthesia Plan Comments:         Anesthesia Quick Evaluation

## 2019-02-22 NOTE — Progress Notes (Signed)
PROGRESS NOTE    Amber Herman  [name] DOB: May 31, 1946 DOA: 02/19/2019 PCP: Renee Rival, NP   Brief Narrative:  Per HPI: Amber Herman  is a 73 y.o. female, with history of diabetes mellitus type 2 atrial fibrillation on anticoagulation with Eliquis, hypertension, hyperlipidemia, TIA, recent ER visit with neck pain, currently on prednisone taper prescribed by patient's PCP.  Patient says that she woke up to go to bathroom and was feeling short of breath and weak.  She checked her blood glucose was over  500.  Patient also had some shortness of breath with palpitations.  Describes chest discomfort on left side. In the ED patient was found to be in A. fib with RVR, she has allergy to Cardizem so she was given IV Lopressor.  Patient did take her Coreg before coming to the ED. She denies nausea vomiting or diarrhea. Denies abdominal pain or dysuria. No previous history of stroke or seizures. No history of CAD.  Assessment & Plan:   Active Problems:   NSTEMI (non-ST elevated myocardial infarction) [facility])   Atrial fibrillation with RVR ([place])   Demand ischemia (HCC)   NSTEMI in setting of rapid atrial fibrillation -Troponins are currently downtrending -Cardiology following, catheterization as below, moderate mid LAD stenosis -Tentative plan for TEE with cardioversion 9/23 -Continue IV heparin, IV nitroglycerin, and IV amiodarone per cardiology  Atrial fibrillation with RVR in setting of PAF -Controlled on IV amiodarone -Eliquis currently on hold for cardiac catheterization -Continue Coreg 25 mg p.o. twice daily -Patient is noted as allergic to Cardizem -Cardioversion this afternoon per cardiology  Hypertension -Continue Coreg for now -Chlorthalidone held due to hyponatremia which is now improving  Hyponatremia likely secondary to chlorthalidone use: stable -Continue to hold chlorthalidone and monitor a.m. labs -Urine and serum osmolarity pending  Type 2 diabetes,  insulin-dependent, controlled -Continue Levemir and current SSI regimen -Initial hyperglycemia secondary to prednisone use for neck pain -Hemoglobin 7.3  Neck pain, chronic -Patient on tapering dose protocol with prednisone per orthopedic surgeon -No complaints noted this morning  History of TIA -Continue aspirin and pravastatin  DVT prophylaxis: Heparin drip Code Status: DNR Family Communication: Daughter over the phone Disposition Plan: Continue further work-up with cardiology, status post catheterization, pending cardioversion/TEE.  Likely ultimate disposition home pending clinical improvement and solution of A. fib with RVR.  Consultants:   Cardiology  Procedures:   TEE with cardioversion planned 02/22/2019  Cardiac catheterization 02/21/2019  Antimicrobials:   None  Subjective: No acute issues or events overnight, denies chest pain, shortness of breath, nausea, vomiting, diarrhea, constipation, headache, fever, chills.  Objective: Vitals:   02/22/19 0238 02/22/19 0339 02/22/19 0637 02/22/19 0702  BP: (!) 92/59 (!) 108/46  127/63  Pulse: 87   72  Resp:      Temp:    97.8 F (36.6 C)  TempSrc:    Oral  SpO2:    98%  Weight:   64.5 kg   Height:        Intake/Output Summary (Last 24 hours) at 02/22/2019 0730 Last data filed at 02/22/2019 0339 Gross per 24 hour  Intake 573.44 ml  Output 1500 ml  Net -926.56 ml   Filed Weights   02/20/19 0233 02/21/19 0500 02/22/19 0637  Weight: 64.8 kg 65.3 kg 64.5 kg    Examination:  General exam: Appears calm and comfortable  Respiratory system: Clear to auscultation. Respiratory effort normal.  Currently on 2 L nasal cannula. Cardiovascular system: S1 & S2 heard, irregular rate.  No JVD, murmurs, rubs, gallops or clicks. No pedal edema. Gastrointestinal system: Abdomen is nondistended, soft and nontender. No organomegaly or masses felt. Normal bowel sounds heard. Central nervous system: Alert and oriented. No focal  neurological deficits. Extremities: Symmetric 5 x 5 power. Skin: No rashes, lesions or ulcers  Data Reviewed: I have personally reviewed following labs and imaging studies  CBC: Recent Labs  Lab 02/19/19 2315 02/20/19 0420 02/21/19 0018  WBC 16.0* 15.4* 15.9*  NEUTROABS 13.3*  --   --   HGB 12.2 11.1* 10.8*  HCT 37.9 34.4* 32.9*  MCV 94.3 92.7 91.4  PLT 513* 464* 123456*   Basic Metabolic Panel: Recent Labs  Lab 02/19/19 2315 02/20/19 0420 02/22/19 0315  NA 128* 133* 132*  K 4.1 3.5 3.1*  CL 91* 94* 95*  CO2 23 25 26   GLUCOSE 343* 158* 102*  BUN 29* 25* 19  CREATININE 0.97 0.80 0.87  CALCIUM 9.4 9.2 8.4*   GFR: Estimated Creatinine Clearance: 49.7 mL/min (by C-G formula based on SCr of 0.87 mg/dL). Liver Function Tests: Recent Labs  Lab 02/20/19 0420  AST 26  ALT 30  ALKPHOS 50  BILITOT 0.4  PROT 6.8  ALBUMIN 3.7   HbA1C: Recent Labs    02/19/19 2315  HGBA1C 7.3*   CBG: Recent Labs  Lab 02/20/19 2148 02/21/19 0728 02/21/19 1345 02/21/19 1525 02/21/19 2142  GLUCAP 275* 158* 158* 162* 158*   Lipid Profile: Recent Labs    02/21/19 0018  CHOL 136  HDL 40*  LDLCALC 67  TRIG 145  CHOLHDL 3.4    Recent Results (from the past 240 hour(s))  SARS CORONAVIRUS 2 (TAT 6-24 HRS) Nasopharyngeal Nasopharyngeal Swab     Status: None   Collection Time: 02/20/19 12:57 AM   Specimen: Nasopharyngeal Swab  Result Value Ref Range Status   SARS Coronavirus 2 NEGATIVE NEGATIVE Final    Comment: (NOTE) SARS-CoV-2 target nucleic acids are NOT DETECTED. The SARS-CoV-2 RNA is generally detectable in upper and lower respiratory specimens during the acute phase of infection. Negative results do not preclude SARS-CoV-2 infection, do not rule out co-infections with other pathogens, and should not be used as the sole basis for treatment or other patient management decisions. Negative results must be combined with clinical observations, patient history, and  epidemiological information. The expected result is Negative. Fact Sheet for Patients: SugarRoll.be Fact Sheet for Healthcare Providers: https://www.woods-mathews.com/ This test is not yet approved or cleared by the [place] FDA and  has been authorized for detection and/or diagnosis of SARS-CoV-2 by FDA under an Emergency Use Authorization (EUA). This EUA will remain  in effect (meaning this test can be used) for the duration of the COVID-19 declaration under Section 56 4(b)(1) of the Act, 21 U.S.C. section 360bbb-3(b)(1), unless the authorization is terminated or revoked sooner. Performed at [facility], [place] [address], [place], [place] [address]   MRSA PCR Screening     Status: None   Collection Time: 02/20/19  3:01 AM   Specimen: Nasal Mucosa; Nasopharyngeal  Result Value Ref Range Status   MRSA by PCR NEGATIVE NEGATIVE Final    Comment:        The GeneXpert MRSA Assay (FDA approved for NASAL specimens only), is one component of a comprehensive MRSA colonization surveillance program. It is not intended to diagnose MRSA infection nor to guide or monitor treatment for MRSA infections. Performed at [facility], [address], [place],  [address]     Radiology  Studies: No results found.  Scheduled Meds: . apixaban  5 mg Oral BID  . aspirin EC  81 mg Oral Daily  . carvedilol  25 mg Oral BID WC  . Chlorhexidine Gluconate Cloth  6 each Topical Daily  . influenza vaccine adjuvanted  0.5 mL Intramuscular Tomorrow-1000  . insulin aspart  0-9 Units Subcutaneous TID WC  . insulin detemir  20 Units Subcutaneous QHS  . pantoprazole  40 mg Oral BID  . pravastatin  40 mg Oral QHS  . predniSONE  40 mg Oral Q breakfast  . sodium chloride flush  3 mL Intravenous Q12H  . sodium chloride flush  3 mL Intravenous Q12H   Continuous Infusions: . sodium chloride    . sodium chloride    . amiodarone 30 mg/hr (02/21/19 2214)      LOS: 1 day   Time spent: 30 minutes  Little Ishikawa, DO [facility] Pager [phone]  If 7PM-7AM, please contact night-coverage [email or web address] Password [email or web address] 02/22/2019, 7:30 AM

## 2019-02-23 ENCOUNTER — Other Ambulatory Visit: Payer: Self-pay | Admitting: Physician Assistant

## 2019-02-23 DIAGNOSIS — E876 Hypokalemia: Secondary | ICD-10-CM

## 2019-02-23 LAB — GLUCOSE, CAPILLARY
Glucose-Capillary: 197 mg/dL — ABNORMAL HIGH (ref 70–99)
Glucose-Capillary: 129 mg/dL — ABNORMAL HIGH (ref 70–99)

## 2019-02-23 LAB — BASIC METABOLIC PANEL
Anion gap: 10 (ref 5–15)
BUN: 18 mg/dL (ref 8–23)
CO2: 25 mmol/L (ref 22–32)
Calcium: 8.5 mg/dL — ABNORMAL LOW (ref 8.9–10.3)
Chloride: 99 mmol/L (ref 98–111)
Creatinine, Ser: 1.14 mg/dL — ABNORMAL HIGH (ref 0.44–1.00)
GFR calc Af Amer: 55 mL/min — ABNORMAL LOW (ref >60)
GFR calc non Af Amer: 48 mL/min — ABNORMAL LOW (ref >60)
Glucose, Bld: 130 mg/dL — ABNORMAL HIGH (ref 70–99)
Potassium: 3.3 mmol/L — ABNORMAL LOW (ref 3.5–5.1)
Sodium: 134 mmol/L — ABNORMAL LOW (ref 135–145)

## 2019-02-23 LAB — TSH: TSH: 4.818 u[IU]/mL — ABNORMAL HIGH (ref 0.350–4.500)

## 2019-02-23 MED ORDER — AMIODARONE HCL 200 MG PO TABS: 200.0000 mg | ORAL_TABLET | Freq: Every day | ORAL | 1 refills | Status: DC

## 2019-02-23 MED ORDER — POTASSIUM CHLORIDE CRYS ER 20 MEQ PO TBCR
40.0000 meq | EXTENDED_RELEASE_TABLET | Freq: Once | ORAL | Status: AC
  Administered 2019-02-23: 40 meq via ORAL
  Filled 2019-02-23: qty 2

## 2019-02-23 MED ORDER — CARVEDILOL 12.5 MG PO TABS: 12.5000 mg | ORAL_TABLET | Freq: Two times a day (BID) | ORAL | 2 refills | Status: DC

## 2019-02-23 MED ORDER — AMIODARONE HCL 200 MG PO TABS: 400.0000 mg | ORAL_TABLET | Freq: Every day | ORAL | Status: DC

## 2019-02-23 MED ORDER — AMIODARONE HCL 200 MG PO TABS: 200.0000 mg | ORAL_TABLET | Freq: Every day | ORAL | Status: DC

## 2019-02-23 MED ORDER — CARVEDILOL 12.5 MG PO TABS: 12.5000 mg | ORAL_TABLET | Freq: Two times a day (BID) | ORAL | Status: DC

## 2019-02-23 MED ORDER — NITROGLYCERIN 0.4 MG SL SUBL: 0.4000 mg | SUBLINGUAL_TABLET | SUBLINGUAL | 2 refills | Status: DC | PRN

## 2019-02-23 MED ORDER — NITROGLYCERIN 0.4 MG SL SUBL: 0.4000 mg | SUBLINGUAL_TABLET | SUBLINGUAL | 12 refills | Status: DC | PRN

## 2019-02-23 MED ORDER — LOSARTAN POTASSIUM 50 MG PO TABS
50.0000 mg | ORAL_TABLET | Freq: Every day | ORAL | Status: DC
  Administered 2019-02-23: 50 mg via ORAL
  Filled 2019-02-23: qty 1

## 2019-02-23 MED ORDER — AMIODARONE HCL 200 MG PO TABS
200.0000 mg | ORAL_TABLET | Freq: Every day | ORAL | Status: DC
  Administered 2019-02-23: 200 mg via ORAL
  Filled 2019-02-23: qty 1

## 2019-02-23 MED ORDER — LOSARTAN POTASSIUM 50 MG PO TABS: 50.0000 mg | ORAL_TABLET | Freq: Every day | ORAL | 2 refills | Status: DC

## 2019-02-23 MED ORDER — LOSARTAN POTASSIUM 50 MG PO TABS: 50.0000 mg | ORAL_TABLET | Freq: Every day | ORAL | 1 refills | Status: DC

## 2019-02-23 MED ORDER — AMIODARONE HCL 200 MG PO TABS: 200.0000 mg | ORAL_TABLET | Freq: Every day | ORAL | 2 refills | Status: DC

## 2019-02-23 NOTE — Progress Notes (Signed)
Progress Note  Patient Name: Amber Herman Date of Encounter: 02/23/2019  Primary Cardiologist: Dr. Darnell Level, [place]  Subjective   Feels well, was working every day pta, wants to go home and go back to work. Is refusing the prednisone, feels she no longer needs it. Will think about f/u, [place] office vs Dr Sondra Come since she works in [place].  Inpatient Medications    Scheduled Meds:  apixaban  5 mg Oral BID   aspirin EC  81 mg Oral Daily   carvedilol  25 mg Oral BID WC   Chlorhexidine Gluconate Cloth  6 each Topical Daily   influenza vaccine adjuvanted  0.5 mL Intramuscular Tomorrow-1000   insulin aspart  0-9 Units Subcutaneous TID WC   insulin detemir  20 Units Subcutaneous QHS   pantoprazole  40 mg Oral BID   pravastatin  40 mg Oral QHS   predniSONE  40 mg Oral Q breakfast   sodium chloride flush  3 mL Intravenous Q12H   Continuous Infusions:  sodium chloride     sodium chloride 50 mL/hr at 02/23/19 0600   PRN Meds: sodium chloride, acetaminophen, HYDROcodone-acetaminophen, methocarbamol, nitroGLYCERIN, ondansetron **OR** ondansetron (ZOFRAN) IV, sodium chloride flush   Vital Signs    Vitals:   02/22/19 2016 02/22/19 2122 02/23/19 0128 02/23/19 0454  BP: (!) 122/46   (!) 156/56  Pulse: 63   (!) 58  Resp:      Temp:  97.8 F (36.6 C)  97.6 F (36.4 C)  TempSrc:  Oral  Oral  SpO2:    100%  Weight:   65.5 kg   Height:        Intake/Output Summary (Last 24 hours) at 02/23/2019 0748 Last data filed at 02/23/2019 0600 Gross per 24 hour  Intake 1844.69 ml  Output 0 ml  Net 1844.69 ml   Last 3 Weights 02/23/2019 02/22/2019 02/21/2019  Weight (lbs) 144 lb 6.4 oz 142 lb 1.6 oz 143 lb 15.4 oz  Weight (kg) 65.499 kg 64.456 kg 65.3 kg      Telemetry    SR !!- Personally Reviewed  Physical Exam    General: Well developed, well nourished, female in no acute distress Head: Eyes PERRLA Normocephalic and atraumatic Lungs: Clear bilaterally  to auscultation. Heart: HRRR S1 S2, without rub or gallop. No murmur.  Pulses are 2+ & equal. No JVD. Abdomen: Bowel sounds are present, abdomen soft and non-tender without masses or  hernias noted. Msk: Normal strength and tone for age. Extremities: No clubbing, cyanosis or edema.    Skin:  No rashes or lesions noted. Neuro: Alert and oriented X 3. Psych:  Good affect, responds appropriately  Labs    High Sensitivity Troponin:   Recent Labs  Lab 02/19/19 2315 02/20/19 0125 02/20/19 0420 02/20/19 1543 02/20/19 1722  TROPONINIHS 84* 416* 1,279* 751* 718*      Chemistry Recent Labs  Lab 02/20/19 0420 02/22/19 0315 02/23/19 0246  NA 133* 132* 134*  K 3.5 3.1* 3.3*  CL 94* 95* 99  CO2 25 26 25   GLUCOSE 158* 102* 130*  BUN 25* 19 18  CREATININE 0.80 0.87 1.14*  CALCIUM 9.2 8.4* 8.5*  PROT 6.8  --   --   ALBUMIN 3.7  --   --   AST 26  --   --   ALT 30  --   --   ALKPHOS 50  --   --   BILITOT 0.4  --   --  GFRNONAA >60 >60 48*  GFRAA >60 >60 55*  ANIONGAP 14 11 10      Hematology Recent Labs  Lab 02/19/19 2315 02/20/19 0420 02/21/19 0018  WBC 16.0* 15.4* 15.9*  RBC 4.02 3.71* 3.60*  HGB 12.2 11.1* 10.8*  HCT 37.9 34.4* 32.9*  MCV 94.3 92.7 91.4  MCH 30.3 29.9 30.0  MCHC 32.2 32.3 32.8  RDW 12.8 12.7 12.6  PLT 513* 464* 443*   Lab Results  Component Value Date   CHOL 136 02/21/2019   HDL 40 (L) 02/21/2019   LDLCALC 67 02/21/2019   TRIG 145 02/21/2019   CHOLHDL 3.4 02/21/2019   Lab Results  Component Value Date   TSH 4.818 (H) 02/23/2019     Radiology    No results found.  Cardiac Studies   TEE/DCCV: 02/22/2019 FINDINGS:  LEFT VENTRICLE: EF = 60-65% No regional wall motion abnormalities.  RIGHT VENTRICLE: Normal size and function.   LEFT ATRIUM: No thrombus/mass.  LEFT ATRIAL APPENDAGE: No thrombus/mass.   RIGHT ATRIUM: No thrombus/mass.  AORTIC VALVE:  Trileaflet. No regurgitation.   MITRAL VALVE:    Normal structure.  Mild regurgitation.   TRICUSPID VALVE: Normal structure. Mild regurgitation.   PULMONIC VALVE: Grossly normal structure. Trace regurgitation.   INTERATRIAL SEPTUM: No PFO or ASD seen by color Doppler.  PERICARDIUM: No effusion noted.  DESCENDING AORTA: Mild plaque seen   CARDIOVERSION:     Indications:  Symptomatic Atrial Fibrillation  Procedure Details:  Once the TEE was complete, the patient had the defibrillator pads placed in the anterior and posterior position. Once an appropriate level of sedation was confirmed, the patient was cardioverted x 1 with 200J of biphasic synchronized energy.  The patient converted to NSR.  There were no apparent complications.  The patient had normal neuro status and respiratory status post procedure with vitals stable as recorded elsewhere.  Adequate airway was maintained throughout and vital signs monitored per protocol.  CATH: 02/21/2019  1. 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 Widely patent left main, left circumflex, and RCA 3. Normal LV systolic function and normal LVEDP  Pt with low BP throughout the procedure but awake and stable. Treated with fluid bolus and discontinuation of IV NTG.  Will review with rounding team. Will resume eliquis tonight. Consider TEE/cardioversion tomorrow.   ECHO: 02/21/2019  1. Left ventricular ejection fraction, by visual estimation, is 60 to 65%. The left ventricle has normal function. Normal left ventricular size. There is mildly increased left ventricular hypertrophy.  2. Left ventricular diastolic function could not be evaluated pattern of LV diastolic filling.  3. Global right ventricle has normal systolic function.The right ventricular size is normal. No increase in right ventricular wall thickness.  4. Left atrial size was normal.  5. Right atrial size was normal.  6. Mild mitral annular calcification.  7. The mitral valve is  normal in structure. No evidence of mitral valve regurgitation. No evidence of mitral stenosis.  8. The tricuspid valve is normal in structure. Tricuspid valve regurgitation was not visualized by color flow Doppler.  9. The aortic valve is normal in structure. Aortic valve regurgitation was not visualized by color flow Doppler. Mild aortic valve sclerosis without stenosis. 10. The pulmonic valve was grossly normal. Pulmonic valve regurgitation is not visualized by color flow Doppler. 11. Normal pulmonary artery systolic pressure. 12. The inferior vena cava is normal in size with greater than 50% respiratory variability, suggesting right atrial pressure  of 3 mmHg.  Patient Profile     73 y.o. female  with a history of PAF, type 2 diabetes mellitus, hypertension, hyperlipidemia, previous TIA presented 09/20 to [facility] with chest pain and rapid atrial fibrillation with abnormal cardiac enzymes.   Assessment & Plan    NSTEMI  - see cath results above, med rx - HS trop peak 1279 - continue ASA, BB, Pravachol 40 mg   PAF - 1 dose of Coreg 25 mg missed 09/22 due to low BP, got both yesterday - on Eliquis 5 mg bid - s/p TEE/DCCV 09/23, SR!! - IV Amio was d/c'd after procedure, MD advise on starting po amio  HTN - BP was low when in fib, higher now that she is in SR  - home rx included losartan 100 mg qd plus chlorthalidone 50 mg qd and the Coreg 25 mg qd - currently just on the Coreg, SBP 122-156 today - MD advise on restarting home rx, could start w/ half doses of losartan and chlorthalidone  HLD - continue statin - HDL 67  History of TIA - on Eliquis  Hypokalemia - Pt got 40 meq yesterday, still low, has another 40 meq ordered - follow  Plan: no further cardiac workup needed. Pt to decide on follow up  For questions or updates, please contact [facility] Please consult [email or web address] for contact info under     Rosaria Ferries, PA-C 02/23/2019 7:48 AM Beeper [phone]

## 2019-02-23 NOTE — Care Management Important Message (Signed)
Important Message  Patient Details  Name: Amber Herman MRN: [id number] Date of Birth: 1946/01/04   Medicare Important Message Given:  Yes     [name] 02/23/2019, 3:24 PM

## 2019-02-23 NOTE — Discharge Summary (Signed)
Physician Discharge Summary [facility]    Patient: Amber Herman                   Admit date: 02/19/2019   DOB: 29-Dec-1945             Discharge date:02/23/2019/11:34 AM [id number]                          PCP: Renee Rival, NP  Disposition: Home  Recommendations for Outpatient Follow-up:   . Follow up: in 1 week  Discharge Condition: Stable   Code Status:   Code Status: DNR  Diet recommendation: Cardiac diet   Discharge Diagnoses:    Active Problems:   NSTEMI (non-ST elevated myocardial infarction) [facility])   Atrial fibrillation with RVR (HCC)   Demand ischemia (HCC)   Persistent atrial fibrillation   History of Present Illness/ Hospital Course Kathleen Argue Summary:   Per HPI: JoreneEnglandis a7 y.o.female,with history of diabetes mellitus type 2 atrial fibrillation on anticoagulation with Eliquis, hypertension, hyperlipidemia, TIA, recent ER visit with neck pain, currently on prednisone taper prescribed by patient's PCP. Patient says that she woke up to go to bathroom and was feeling short of breath and weak. She checked her blood glucose was over 500.Patient also had some shortness of breath with palpitations. Describes chest discomfort on left side. In the ED patient was found to be in A. fib with RVR, she has allergy to Cardizem so she was given IV Lopressor. Patient did take her Coreg before coming to the ED. She denies nausea vomiting or diarrhea. Denies abdominal pain or dysuria. No previous history of stroke or seizures. No history of CAD.  9/22: Patient has been feeling better throughout the night without further symptoms.  Heart rate is better controlled.  Troponins are downtrending.  Plans for transfer to [facility] for cardiac catheterization this a.m.  Assessment & Plan:   Active Problems:   NSTEMI (non-ST elevated myocardial infarction) (HCC)   Atrial fibrillation with RVR ([place])   NSTEMI in setting of rapid atrial  fibrillation -Chest pain resolved, troponin trending down -Patient was transferred to: Status post cardiac cath -Continue IV heparin, IV nitroglycerin, and IV amiodarone -DC heparin drip, back on Eliquis -DC nitroglycerin, changing IV amiodarone to p.o.   CATH: 02/21/2019  1. 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 Widely patent left main, left circumflex, and RCA 3. Normal LV systolic function and normal LVEDP  -Resuming aspirin, Eliquis, beta-blocker, statins  Atrial fibrillation with RVR in setting of PAF -Status post TEE/DCCV 02/22/2019, normal sinus rhythm now -Coreg reduced from 25 ..12.5 milligrams p.o. twice daily, resume aspirin, statins, Eliquis -IV amiodarone was switched to p.o.  -DC'd IV amiodarone, (allergic to Cardizem)   Hypertension /mildly hypotensive -Continue Coreg reduced dose from 50 to 12.5 mg p.o. twice daily -Chlorthalidone ---> recommended to DC by cardiology  Hyponatremia likely secondary to chlorthalidone use-improving -Continue to hold chlorthalidone and monitor a.m. labs -Urine and serum osmolarity pending  Type 2 diabetes with improved control -Continue Levemir and current SSI regimen -Initial hyperglycemia secondary to prednisone use for neck pain  Neck pain -Patient on tapering dose protocol with prednisone per orthopedic surgeon -No complaints noted this morning  History of TIA -Continue aspirin and pravastatin    ECHO: 02/21/2019 1. Left ventricular ejection fraction, by visual estimation, is 60 to 65%. The left ventricle has normal function. Normal  left ventricular size. There is mildly increased left ventricular hypertrophy. 2. Left ventricular diastolic function could not be evaluated pattern of LV diastolic filling. 3. Global right ventricle has normal systolic function.The right ventricular size is normal. No increase in right ventricular wall thickness. 4.  Left atrial size was normal. 5. Right atrial size was normal. 6. Mild mitral annular calcification. 7. The mitral valve is normal in structure. No evidence of mitral valve regurgitation. No evidence of mitral stenosis. 8. The tricuspid valve is normal in structure. Tricuspid valve regurgitation was not visualized by color flow Doppler. 9. The aortic valve is normal in structure. Aortic valve regurgitation was not visualized by color flow Doppler. Mild aortic valve sclerosis without stenosis. 10. The pulmonic valve was grossly normal. Pulmonic valve regurgitation is not visualized by color flow Doppler. 11. Normal pulmonary artery systolic pressure. 12. The inferior vena cava is normal in size with greater than 50% respiratory variability, suggesting right atrial pressure of 3 mmHg.    Discharge Instructions:   Discharge Instructions    Activity as tolerated - No restrictions   Complete by: As directed    Diet - low sodium heart healthy   Complete by: As directed    Discharge instructions   Complete by: As directed    Continue Eliquis current dose, started on amiodarone, reduce Coreg dose  Follow-up with Dr. Domenic Polite in [place] or [place] in 1-2 weeks.   Increase activity slowly   Complete by: As directed        Medication List    STOP taking these medications   chlorthalidone 50 MG tablet Commonly known as: HYGROTON   cyclobenzaprine 5 MG tablet Commonly known as: FLEXERIL   methocarbamol 500 MG tablet Commonly known as: ROBAXIN   predniSONE 20 MG tablet Commonly known as: DELTASONE     TAKE these medications   acetaminophen 325 MG tablet Commonly known as: Tylenol Take 2 tablets (650 mg total) by mouth every 8 (eight) hours as needed.   amiodarone 200 MG tablet Commonly known as: PACERONE Take 1 tablet (200 mg total) by mouth daily.   apixaban 5 MG Tabs tablet Commonly known as: ELIQUIS Take 1 tablet (5 mg total) by mouth 2 (two) times daily.   aspirin  EC 81 MG tablet Take 1 tablet (81 mg total) by mouth daily.   carvedilol 12.5 MG tablet Commonly known as: COREG Take 1 tablet (12.5 mg total) by mouth 2 (two) times daily with a meal. What changed:   medication strength  how much to take   FeroSul 325 (65 FE) MG tablet Generic drug: ferrous sulfate Take 325 mg by mouth daily.   KLOR-CON M20 PO Take 1 tablet by mouth daily.   losartan 50 MG tablet Commonly known as: COZAAR Take 1 tablet (50 mg total) by mouth daily. What changed:   medication strength  how much to take   metFORMIN 850 MG tablet Commonly known as: GLUCOPHAGE Take 850 mg by mouth 2 (two) times daily with a meal.   nitroGLYCERIN 0.4 MG SL tablet Commonly known as: NITROSTAT Place 1 tablet (0.4 mg total) under the tongue every 5 (five) minutes as needed for chest pain (CP or SOB).   pravastatin 40 MG tablet Commonly known as: PRAVACHOL Take 40 mg by mouth at bedtime.   traMADol 50 MG tablet Commonly known as: ULTRAM Take 50 mg by mouth every 8 (eight) hours as needed for moderate pain.   Tyler Aas FlexTouch 200 UNIT/ML Sopn Generic drug: Insulin  Degludec Inject 36 Units into the skin at bedtime.   triamcinolone cream 0.1 % Commonly known as: KENALOG Apply 1 application topically See admin instructions. 1 application to affected area twice daily, Mix 1:1 with Eucerin externally   vitamin B-12 1000 MCG tablet Commonly known as: CYANOCOBALAMIN Take 1,000 mcg by mouth daily.      Follow-up Information    [facility] Follow up.   Specialty: Cardiology Why: BMET (lab work for kidney function and potassium) in 1 week Contact information: [place] [phone]       Satira Sark, MD Follow up on 03/08/2019.   Specialty: Cardiology Why: Please arrive just after 10 am for a 10:20 am appt w/ Dr Alisia Ferrari information: [place] [address] [phone]         [name], MD Follow up.   Specialty: Cardiology Why: Message has been sent. Contact information: [id number] [place] [address] [phone]          Allergies  Allergen Reactions  . Amlodipine   . Angiotensin Receptor Blockers     Taking losartan at admission  . Calcium Channel Blockers   . Latex Hives and Itching  . Nicardipine Hcl   . Simvastatin   . Sulfa Antibiotics Itching and Rash  . Sulfacetamide Sodium Itching and Rash  . Sulfasalazine Itching and Rash     Procedures /Studies:   Dg Chest Port 1 View  Result Date: 02/19/2019 CLINICAL DATA:  Chest pain EXAM: PORTABLE CHEST 1 VIEW COMPARISON:  11/02/2017 FINDINGS: Mild cardiomegaly with aortic atherosclerosis. No consolidation or effusion. No pneumothorax. IMPRESSION: No active disease.  Mild cardiomegaly. Electronically Signed   By: Donavan Foil M.D.   On: 02/19/2019 23:27     Subjective:   Patient was seen and examined 02/23/2019, 11:34 AM Patient stable today. No acute distress.  No issues overnight Stable for discharge.  Discharge Exam:    Vitals:   02/22/19 2016 02/22/19 2122 02/23/19 0128 02/23/19 0454  BP: (!) 122/46   (!) 156/56  Pulse: 63   (!) 58  Resp:      Temp:  97.8 F (36.6 C)  97.6 F (36.4 C)  TempSrc:  Oral  Oral  SpO2:    100%  Weight:   65.5 kg   Height:        General: Pt lying comfortably in bed & appears in no obvious distress. Cardiovascular: S1 & S2 heard, RRR, S1/S2 +. No murmurs, rubs, gallops or clicks. No JVD or pedal edema. Respiratory: Clear to auscultation without wheezing, rhonchi or crackles. No increased work of breathing. Abdominal:  Non-distended, non-tender & soft. No organomegaly or masses appreciated. Normal bowel sounds heard. CNS: Alert and oriented. No focal deficits. Extremities: no edema, no cyanosis    The results of significant diagnostics from this hospitalization (including imaging, microbiology, ancillary and  laboratory) are listed below for reference.      Microbiology:   Recent Results (from the past 240 hour(s))  SARS CORONAVIRUS 2 (TAT 6-24 HRS) Nasopharyngeal Nasopharyngeal Swab     Status: None   Collection Time: 02/20/19 12:57 AM   Specimen: Nasopharyngeal Swab  Result Value Ref Range Status   SARS Coronavirus 2 NEGATIVE NEGATIVE Final    Comment: (NOTE) SARS-CoV-2 target nucleic acids are NOT DETECTED. The SARS-CoV-2 RNA is generally detectable in upper and lower respiratory specimens during the acute phase of infection. Negative results  do not preclude SARS-CoV-2 infection, do not rule out co-infections with other pathogens, and should not be used as the sole basis for treatment or other patient management decisions. Negative results must be combined with clinical observations, patient history, and epidemiological information. The expected result is Negative. Fact Sheet for Patients: SugarRoll.be Fact Sheet for Healthcare Providers: https://www.woods-mathews.com/ This test is not yet approved or cleared by the [place] FDA and  has been authorized for detection and/or diagnosis of SARS-CoV-2 by FDA under an Emergency Use Authorization (EUA). This EUA will remain  in effect (meaning this test can be used) for the duration of the COVID-19 declaration under Section 56 4(b)(1) of the Act, 21 U.S.C. section 360bbb-3(b)(1), unless the authorization is terminated or revoked sooner. Performed at [facility], [place] [address], [place], [place] [address]   MRSA PCR Screening     Status: None   Collection Time: 02/20/19  3:01 AM   Specimen: Nasal Mucosa; Nasopharyngeal  Result Value Ref Range Status   MRSA by PCR NEGATIVE NEGATIVE Final    Comment:        The GeneXpert MRSA Assay (FDA approved for NASAL specimens only), is one component of a comprehensive MRSA colonization surveillance program. It is not intended to diagnose  MRSA infection nor to guide or monitor treatment for MRSA infections. Performed at [facility], [address] [place], [place] [address]      Labs:   CBC: Recent Labs  Lab 02/19/19 2315 02/20/19 0420 02/21/19 0018  WBC 16.0* 15.4* 15.9*  NEUTROABS 13.3*  --   --   HGB 12.2 11.1* 10.8*  HCT 37.9 34.4* 32.9*  MCV 94.3 92.7 91.4  PLT 513* 464* 123456*   Basic Metabolic Panel: Recent Labs  Lab 02/19/19 2315 02/20/19 0420 02/22/19 0315 02/23/19 0246  NA 128* 133* 132* 134*  K 4.1 3.5 3.1* 3.3*  CL 91* 94* 95* 99  CO2 23 25 26 25   GLUCOSE 343* 158* 102* 130*  BUN 29* 25* 19 18  CREATININE 0.97 0.80 0.87 1.14*  CALCIUM 9.4 9.2 8.4* 8.5*   Liver Function Tests: Recent Labs  Lab 02/20/19 0420  AST 26  ALT 30  ALKPHOS 50  BILITOT 0.4  PROT 6.8  ALBUMIN 3.7   BNP (last 3 results) No results for input(s): BNP in the last 8760 hours. Cardiac Enzymes: No results for input(s): CKTOTAL, CKMB, CKMBINDEX, TROPONINI in the last 168 hours. CBG: Recent Labs  Lab 02/22/19 1215 02/22/19 1608 02/22/19 2121 02/23/19 0752 02/23/19 1116  GLUCAP 126* 196* 182* 197* 129*   Hgb A1c No results for input(s): HGBA1C in the last 72 hours. Lipid Profile Recent Labs    02/21/19 0018  CHOL 136  HDL 40*  LDLCALC 67  TRIG 145  CHOLHDL 3.4   Thyroid function studies Recent Labs    02/23/19 0246  TSH 4.818*   Anemia work up No results for input(s): VITAMINB12, FOLATE, FERRITIN, TIBC, IRON, RETICCTPCT in the last 72 hours. Urinalysis    Component Value Date/Time   COLORURINE STRAW (A) 06/03/2018 2021   APPEARANCEUR CLEAR 06/03/2018 2021   LABSPEC 1.008 06/03/2018 2021   PHURINE 7.0 06/03/2018 2021   GLUCOSEU NEGATIVE 06/03/2018 2021   HGBUR SMALL (A) 06/03/2018 2021   BILIRUBINUR NEGATIVE 06/03/2018 [place] 06/03/2018 2021   PROTEINUR NEGATIVE 06/03/2018 2021   UROBILINOGEN 0.2 06/29/2014 1729   NITRITE NEGATIVE 06/03/2018 2021   LEUKOCYTESUR  SMALL (A) 06/03/2018 2021  Time coordinating discharge: Over 30 minutes  SIGNED: Deatra James, MD, FACP, [facility]. [facility],  Pager [phone]  If 7PM-7AM, please contact night-coverage [email or web address] 02/23/2019, 11:34 AM

## 2019-02-23 NOTE — Discharge Instructions (Signed)
Radial Site Care ° °This sheet gives you information about how to care for yourself after your procedure. Your health care provider may also give you more specific instructions. If you have problems or questions, contact your health care provider. °What can I expect after the procedure? °After the procedure, it is common to have: °· Bruising and tenderness at the catheter insertion area. °Follow these instructions at home: °Medicines °· Take over-the-counter and prescription medicines only as told by your health care provider. °Insertion site care °· Follow instructions from your health care provider about how to take care of your insertion site. Make sure you: °? Wash your hands with soap and water before you change your bandage (dressing). If soap and water are not available, use hand sanitizer. °? Change your dressing as told by your health care provider. °? Leave stitches (sutures), skin glue, or adhesive strips in place. These skin closures may need to stay in place for 2 weeks or longer. If adhesive strip edges start to loosen and curl up, you may trim the loose edges. Do not remove adhesive strips completely unless your health care provider tells you to do that. °· Check your insertion site every day for signs of infection. Check for: °? Redness, swelling, or pain. °? Fluid or blood. °? Pus or a bad smell. °? Warmth. °· Do not take baths, swim, or use a hot tub until your health care provider approves. °· You may shower 24-48 hours after the procedure, or as directed by your health care provider. °? Remove the dressing and gently wash the site with plain soap and water. °? Pat the area dry with a clean towel. °? Do not rub the site. That could cause bleeding. °· Do not apply powder or lotion to the site. °Activity ° °· For 24 hours after the procedure, or as directed by your health care provider: °? Do not flex or bend the affected arm. °? Do not push or pull heavy objects with the affected arm. °? Do not  drive yourself home from the hospital or clinic. You may drive 24 hours after the procedure unless your health care provider tells you not to. °? Do not operate machinery or power tools. °· Do not lift anything that is heavier than 10 lb (4.5 kg), or the limit that you are told, until your health care provider says that it is safe. °· Ask your health care provider when it is okay to: °? Return to work or school. °? Resume usual physical activities or sports. °? Resume sexual activity. °General instructions °· If the catheter site starts to bleed, raise your arm and put firm pressure on the site. If the bleeding does not stop, get help right away. This is a medical emergency. °· If you went home on the same day as your procedure, a responsible adult should be with you for the first 24 hours after you arrive home. °· Keep all follow-up visits as told by your health care provider. This is important. °Contact a health care provider if: °· You have a fever. °· You have redness, swelling, or yellow drainage around your insertion site. °Get help right away if: °· You have unusual pain at the radial site. °· The catheter insertion area swells very fast. °· The insertion area is bleeding, and the bleeding does not stop when you hold steady pressure on the area. °· Your arm or hand becomes pale, cool, tingly, or numb. °These symptoms may represent a serious problem   that is an emergency. Do not wait to see if the symptoms will go away. Get medical help right away. Call your local emergency services (911 in the [place] Do not drive yourself to the hospital. Summary  After the procedure, it is common to have bruising and tenderness at the site.  Follow instructions from your health care provider about how to take care of your radial site wound. Check the wound every day for signs of infection.  Do not lift anything that is heavier than 10 lb (4.5 kg), or the limit that you are told, until your health care provider says  that it is safe. This information is not intended to replace advice given to you by your health care provider. Make sure you discuss any questions you have with your health care provider. Document Released: 06/20/2010 Document Revised: 06/23/2017 Document Reviewed: 06/23/2017 Elsevier Patient Education  2020 [employer].     Atrial Fibrillation  Atrial fibrillation is a type of heartbeat that is irregular or fast (rapid). If you have this condition, your heart beats without any order. This makes it hard for your heart to pump blood in a normal way. Having this condition gives you more risk for stroke, heart failure, and other heart problems. Atrial fibrillation may start all of a sudden and then stop on its own, or it may become a long-lasting problem. What are the causes? This condition may be caused by heart conditions, such as:  High blood pressure.  Heart failure.  Heart valve disease.  Heart surgery. Other causes include:  Pneumonia.  Obstructive sleep apnea.  Lung cancer.  Thyroid disease.  Drinking too much alcohol. Sometimes the cause is not known. What increases the risk? You are more likely to develop this condition if:  You smoke.  You are older.  You have diabetes.  You are overweight.  You have a family history of this condition.  You exercise often and hard. What are the signs or symptoms? Common symptoms of this condition include:  A feeling like your heart is beating very fast.  Chest pain.  Feeling short of breath.  Feeling light-headed or weak.  Getting tired easily. Follow these instructions at home: Medicines  Take over-the-counter and prescription medicines only as told by your doctor.  If your doctor gives you a blood-thinning medicine, take it exactly as told. Taking too much of it can cause bleeding. Taking too little of it does not protect you against clots. Clots can cause a stroke. Lifestyle      Do not use any  tobacco products. These include cigarettes, chewing tobacco, and e-cigarettes. If you need help quitting, ask your doctor.  Do not drink alcohol.  Do not drink beverages that have caffeine. These include coffee, soda, and tea.  Follow diet instructions as told by your doctor.  Exercise regularly as told by your doctor. General instructions  If you have a condition that causes breathing to stop for a short period of time (apnea), treat it as told by your doctor.  Keep a healthy weight. Do not use diet pills unless your doctor says they are safe for you. Diet pills may make heart problems worse.  Keep all follow-up visits as told by your doctor. This is important. Contact a doctor if:  You notice a change in the speed, rhythm, or strength of your heartbeat.  You are taking a blood-thinning medicine and you see more bruising.  You get tired more easily when you move or exercise.  You have a sudden change in weight. Get help right away if:   You have pain in your chest or your belly (abdomen).  You have trouble breathing.  You have blood in your vomit, poop, or pee (urine).  You have any signs of a stroke. "BE FAST" is an easy way to remember the main warning signs: ? B - Balance. Signs are dizziness, sudden trouble walking, or loss of balance. ? E - Eyes. Signs are trouble seeing or a change in how you see. ? F - Face. Signs are sudden weakness or loss of feeling in the face, or the face or eyelid drooping on one side. ? A - Arms. Signs are weakness or loss of feeling in an arm. This happens suddenly and usually on one side of the body. ? S - Speech. Signs are sudden trouble speaking, slurred speech, or trouble understanding what people say. ? T - Time. Time to call emergency services. Write down what time symptoms started.  You have other signs of a stroke, such as: ? A sudden, very bad headache with no known cause. ? Feeling sick to your stomach (nausea). ? Throwing up  (vomiting). ? Jerky movements you cannot control (seizure). These symptoms may be an emergency. Do not wait to see if the symptoms will go away. Get medical help right away. Call your local emergency services (911 in the [place] Do not drive yourself to the hospital. Summary  Atrial fibrillation is a type of heartbeat that is irregular or fast (rapid).  You are at higher risk of this condition if you smoke, are older, have diabetes, or are overweight.  Follow your doctor's instructions about medicines, diet, exercise, and follow-up visits.  Get help right away if you think that you have signs of a stroke. This information is not intended to replace advice given to you by your health care provider. Make sure you discuss any questions you have with your health care provider. Document Released: 02/25/2008 Document Revised: 07/22/2017 Document Reviewed: 07/09/2017 Elsevier Patient Education  2020 [place] Heart-healthy meal planning includes:  Eating less unhealthy fats.  Eating more healthy fats.  Making other changes in your diet. Talk with your doctor or a diet specialist (dietitian) to create an eating plan that is right for you. What is my plan? Your doctor may recommend an eating plan that includes:  Total fat: ______% or less of total calories a day.  Saturated fat: ______% or less of total calories a day.  Cholesterol: less than _________mg a day. What are tips for following this plan? Cooking Avoid frying your food. Try to bake, boil, grill, or broil it instead. You can also reduce fat by:  Removing the skin from poultry.  Removing all visible fats from meats.  Steaming vegetables in water or broth. Meal planning   At meals, divide your plate into four equal parts: ? Fill one-half of your plate with vegetables and green salads. ? Fill one-fourth of your plate with whole grains. ? Fill one-fourth of your plate with lean  protein foods.  Eat 4-5 servings of vegetables per day. A serving of vegetables is: ? 1 cup of raw or cooked vegetables. ? 2 cups of raw leafy greens.  Eat 4-5 servings of fruit per day. A serving of fruit is: ? 1 medium whole fruit. ?  cup of dried fruit. ?  cup of fresh, frozen, or canned fruit. ?  cup of 100%  fruit juice.  Eat more foods that have soluble fiber. These are apples, broccoli, carrots, beans, peas, and barley. Try to get 20-30 g of fiber per day.  Eat 4-5 servings of nuts, legumes, and seeds per week: ? 1 serving of dried beans or legumes equals  cup after being cooked. ? 1 serving of nuts is  cup. ? 1 serving of seeds equals 1 tablespoon. General information  Eat more home-cooked food. Eat less restaurant, buffet, and fast food.  Limit or avoid alcohol.  Limit foods that are high in starch and sugar.  Avoid fried foods.  Lose weight if you are overweight.  Keep track of how much salt (sodium) you eat. This is important if you have high blood pressure. Ask your doctor to tell you more about this.  Try to add vegetarian meals each week. Fats  Choose healthy fats. These include olive oil and canola oil, flaxseeds, walnuts, almonds, and seeds.  Eat more omega-3 fats. These include salmon, mackerel, sardines, tuna, flaxseed oil, and ground flaxseeds. Try to eat fish at least 2 times each week.  Check food labels. Avoid foods with trans fats or high amounts of saturated fat.  Limit saturated fats. ? These are often found in animal products, such as meats, butter, and cream. ? These are also found in plant foods, such as palm oil, palm kernel oil, and coconut oil.  Avoid foods with partially hydrogenated oils in them. These have trans fats. Examples are stick margarine, some tub margarines, cookies, crackers, and other baked goods. What foods can I eat? Fruits All fresh, canned (in natural juice), or frozen fruits. Vegetables Fresh or frozen vegetables  (raw, steamed, roasted, or grilled). Green salads. Grains Most grains. Choose whole wheat and whole grains most of the time. Rice and pasta, including brown rice and pastas made with whole wheat. Meats and other proteins Lean, well-trimmed beef, veal, pork, and lamb. Chicken and [place] without skin. All fish and shellfish. Wild duck, rabbit, pheasant, and venison. Egg whites or low-cholesterol egg substitutes. Dried beans, peas, lentils, and tofu. Seeds and most nuts. Dairy Low-fat or nonfat cheeses, including ricotta and mozzarella. Skim or 1% milk that is liquid, powdered, or evaporated. Buttermilk that is made with low-fat milk. Nonfat or low-fat yogurt. Fats and oils Non-hydrogenated (trans-free) margarines. Vegetable oils, including soybean, sesame, sunflower, olive, peanut, safflower, corn, canola, and cottonseed. Salad dressings or mayonnaise made with a vegetable oil. Beverages Mineral water. Coffee and tea. Diet carbonated beverages. Sweets and desserts Sherbet, gelatin, and fruit ice. Small amounts of dark chocolate. Limit all sweets and desserts. Seasonings and condiments All seasonings and condiments. The items listed above may not be a complete list of foods and drinks you can eat. Contact a dietitian for more options. What foods should I avoid? Fruits Canned fruit in heavy syrup. Fruit in cream or butter sauce. Fried fruit. Limit coconut. Vegetables Vegetables cooked in cheese, cream, or butter sauce. Fried vegetables. Grains Breads that are made with saturated or trans fats, oils, or whole milk. Croissants. Sweet rolls. Donuts. High-fat crackers, such as cheese crackers. Meats and other proteins Fatty meats, such as hot dogs, ribs, sausage, bacon, rib-eye roast or steak. High-fat deli meats, such as salami and bologna. Caviar. Domestic duck and goose. Organ meats, such as liver. Dairy Cream, sour cream, cream cheese, and creamed cottage cheese. Whole-milk cheeses. Whole or  2% milk that is liquid, evaporated, or condensed. Whole buttermilk. Cream sauce or high-fat cheese sauce. Yogurt that is  made from whole milk. Fats and oils Meat fat, or shortening. Cocoa butter, hydrogenated oils, palm oil, coconut oil, palm kernel oil. Solid fats and shortenings, including bacon fat, salt pork, lard, and butter. Nondairy cream substitutes. Salad dressings with cheese or sour cream. Beverages Regular sodas and juice drinks with added sugar. Sweets and desserts Frosting. Pudding. Cookies. Cakes. Pies. Milk chocolate or white chocolate. Buttered syrups. Full-fat ice cream or ice cream drinks. The items listed above may not be a complete list of foods and drinks to avoid. Contact a dietitian for more information. Summary  Heart-healthy meal planning includes eating less unhealthy fats, eating more healthy fats, and making other changes in your diet.  Eat a balanced diet. This includes fruits and vegetables, low-fat or nonfat dairy, lean protein, nuts and legumes, whole grains, and heart-healthy oils and fats. This information is not intended to replace advice given to you by your health care provider. Make sure you discuss any questions you have with your health care provider. Document Released: 11/17/2011 Document Revised: 07/22/2017 Document Reviewed: 06/25/2017 Elsevier Patient Education  2020 [employer].

## 2019-02-23 NOTE — Evaluation (Signed)
Physical Therapy Evaluation/ Discharge Patient Details Name: Amber Herman MRN: [id number] DOB: [birth year] Today's Date: 02/23/2019   History of Present Illness  73 yo admitted with Afib s/p DCCV. PMHx; HTN, HLD, TIA, DM, AFib  Clinical Impression  Pt supine on arrival finishing lunch with daughter present. 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 Pt reports right shoulder/ neck pain with noted upper trapezius trigger point with improved ROM and decreased pain after trigger point release. Pt educated for massage and heat to assist with improving pain and function for home. Pt able to perform all transfers, gait and mobility without assist with HR 72-88 and no SoB with SpO2 100% on RA. Pt able to perform self care after toileting and reach feet for dressing without report of falls. No further acute therapy needs and pt aware and agreeable. Pt encouraged to walk in halls daily and pt reports increased energy from PTA.     Follow Up Recommendations No PT follow up    Equipment Recommendations  None recommended by PT    Recommendations for Other Services       Precautions / Restrictions Precautions Precautions: None      Mobility  Bed Mobility Overal bed mobility: Independent                Transfers Overall transfer level: Independent                  Ambulation/Gait Ambulation/Gait assistance: Independent Gait Distance (Feet): 600 Feet Assistive device: None Gait Pattern/deviations: WFL(Within Functional Limits)   Gait velocity interpretation: >4.37 ft/sec, indicative of normal walking speed General Gait Details: quick gait with good stability and HR 88  Stairs            Wheelchair Mobility    Modified Rankin (Stroke Patients Only)       Balance Overall balance assessment: No apparent balance deficits (not formally assessed)                                            Pertinent Vitals/Pain Pain Assessment: 0-10 Pain Score: 5  Pain Location: right upper trapezius 5/10 initially 3/10 after trigger point release Pain Descriptors / Indicators: Sore;Aching Pain Intervention(s): Repositioned;Other (comment)(massage)    Home Living Family/patient expects to be discharged to:: Private residence Living Arrangements: Alone Available Help at Discharge: Family;Available 24 hours/day Type of Home: House Home Access: Level entry     Home Layout: One level Home Equipment: None      Prior Function Level of Independence: Independent         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)        Extremity/Trunk Assessment   Upper Extremity Assessment Upper Extremity Assessment: Overall WFL for tasks assessed    Lower Extremity Assessment Lower Extremity Assessment: Overall WFL for tasks assessed    Cervical / Trunk Assessment Cervical / Trunk Assessment: Other exceptions Cervical / Trunk Exceptions: rounded shoulders  Communication   Communication: No difficulties  Cognition Arousal/Alertness: Awake/alert Behavior During Therapy: WFL for tasks assessed/performed Overall Cognitive Status: Within Functional Limits for tasks assessed  General Comments      Exercises     Assessment/Plan    PT Assessment Patent does not need any further PT services  PT Problem List         PT Treatment Interventions      PT Goals (Current goals can be found in the Care Plan section)  Acute Rehab PT Goals PT Goal Formulation: All assessment and education complete, DC therapy    Frequency     Barriers to discharge        Co-evaluation               AM-PAC PT "6 Clicks" Mobility  Outcome Measure Help needed turning from your back to your side while in a flat bed without using bedrails?: None Help needed moving from lying on your back to sitting on the side  of a flat bed without using bedrails?: None Help needed moving to and from a bed to a chair (including a wheelchair)?: None Help needed standing up from a chair using your arms (e.g., wheelchair or bedside chair)?: None Help needed to walk in hospital room?: None Help needed climbing 3-5 steps with a railing? : None 6 Click Score: 24    End of Session   Activity Tolerance: Patient tolerated treatment well Patient left: in chair;with call bell/phone within reach;with family/visitor present Nurse Communication: Mobility status PT Visit Diagnosis: Other abnormalities of gait and mobility (R26.89)    Time: [id number] PT Time Calculation (min) (ACUTE ONLY): 17 min   Charges:   PT Evaluation $PT Eval Low Complexity: [place], PT Acute Rehabilitation Services Pager: [phone] Office: [phone]   Kinleigh Nault B Fantasy Donald 02/23/2019, 11:59 AM

## 2019-03-02 ENCOUNTER — Other Ambulatory Visit (HOSPITAL_COMMUNITY)
Admission: RE | Admit: 2019-03-02 | Discharge: 2019-03-02 | Disposition: A | Discharge status: Home / Self Care | Payer: Medicare Other | Source: Ambulatory Visit | Attending: Physician Assistant | Admitting: Physician Assistant

## 2019-03-02 DIAGNOSIS — E876 Hypokalemia: Secondary | ICD-10-CM | POA: Diagnosis present

## 2019-03-02 LAB — BASIC METABOLIC PANEL
Anion gap: 12 (ref 5–15)
BUN: 30 mg/dL — ABNORMAL HIGH (ref 8–23)
CO2: 25 mmol/L (ref 22–32)
Calcium: 9.1 mg/dL (ref 8.9–10.3)
Chloride: 92 mmol/L — ABNORMAL LOW (ref 98–111)
Creatinine, Ser: 0.84 mg/dL (ref 0.44–1.00)
GFR calc Af Amer: >60 mL/min (ref >60)
GFR calc non Af Amer: >60 mL/min (ref >60)
Glucose, Bld: 87 mg/dL (ref 70–99)
Potassium: 4 mmol/L (ref 3.5–5.1)
Sodium: 129 mmol/L — ABNORMAL LOW (ref 135–145)

## 2019-03-06 ENCOUNTER — Telehealth: Payer: Self-pay | Admitting: Physician Assistant

## 2019-03-06 NOTE — Telephone Encounter (Signed)
Attempted to reach pt. LVM to call back. Scheduled pt with soonest available in office appt with Richardson Dopp, PA on Tuesday, 03/14/19 at 8:45 AM. Also scheduled in office appt with Dr. Burt Knack on Monday, 05/08/19 at 1:40 PM.   Routing note to Dorris Singh, and Dr. Burt Knack as Juluis Rainier.  Will attempt to reach the pt again.      Barrett, Evelene Croon, PA-C  Therisa Doyne   Cc: Barrett, Evelene Croon, PA-C        See below.  Can you call her and talk to her about following up with Dr Burt Knack in [place]?  She has appt 10/7 in [facility] w/ Dr Domenic Polite.  Needs to be seen soon, offer [facility] first available.  Thanks   Previous Messages  ----- Message -----  From: [name], MD  Sent: 02/26/2019  9:14 PM EDT  To: Lonn Georgia, PA-C  Subject: RE: follow up                   Thanks Suanne Marker. I can see her as long as they understand my limited office availability, and that she will be co-managed by Nicki Reaper.  ----- Message -----  From: Reola Mosher  Sent: 02/23/2019 11:16 AM EDT  To: Lonn Georgia, PA-C, [name], MD  Subject: follow up                     You saw her when you did her heart cath.   Her son-in-law, Teola Bradley, RN is in administration at [facility].  She and he are requesting that you be her cardiologist.  Initially seen by [facility], has f/u with him in [place].   I explained that Dr Domenic Polite saw her first, and said I was not sure you were taking new patients since you are doing TAVR and Mitraclip as well as STEMI call.   Just let me know and I will get word to her/him.   Thanks

## 2019-03-06 NOTE — Telephone Encounter (Signed)
This is fine. Pt reached out and wants to follow with me. thanks

## 2019-03-07 NOTE — Telephone Encounter (Signed)
Left message to call back  

## 2019-03-07 NOTE — Telephone Encounter (Signed)
Pt returned call. Pt stated since her appt with Dr. Domenic Polite os sooner, she will go to that first. Informed pt she will need to ask Dr. Domenic Polite how soon she should follow up after that, may be ok to keep follow-up with Dr. Burt Knack in December ind cancel appt with Richardson Dopp, PA on 10/13. Pt stated she will find out at appt with Dr. Domenic Polite tomorrow. Pt stated she has been having some pain under her arm, which is why she would like to keep appt tomorrow. I explained to pt that is just fine and will keep her scheduled for all appts for now. Pt verbalized thanks and understanding.

## 2019-03-08 ENCOUNTER — Encounter: Payer: Self-pay | Admitting: Cardiology

## 2019-03-08 ENCOUNTER — Ambulatory Visit (INDEPENDENT_AMBULATORY_CARE_PROVIDER_SITE_OTHER): Payer: Medicare Other | Admitting: Cardiology

## 2019-03-08 ENCOUNTER — Other Ambulatory Visit: Payer: Self-pay

## 2019-03-08 VITALS — BP 148/58 | HR 61 | Ht 64.0 in | Wt 144.0 lb

## 2019-03-08 DIAGNOSIS — I200 Unstable angina: Secondary | ICD-10-CM

## 2019-03-08 DIAGNOSIS — E782 Mixed hyperlipidemia: Secondary | ICD-10-CM | POA: Diagnosis not present

## 2019-03-08 DIAGNOSIS — I214 Non-ST elevation (NSTEMI) myocardial infarction: Secondary | ICD-10-CM | POA: Diagnosis not present

## 2019-03-08 DIAGNOSIS — I10 Essential (primary) hypertension: Secondary | ICD-10-CM

## 2019-03-08 DIAGNOSIS — Z23 Encounter for immunization: Secondary | ICD-10-CM

## 2019-03-08 DIAGNOSIS — I480 Paroxysmal atrial fibrillation: Secondary | ICD-10-CM

## 2019-03-08 NOTE — Patient Instructions (Addendum)
Medication Instructions:   Your physician recommends that you continue on your current medications as directed. Please refer to the Current Medication list given to you today.  Labwork:  NONE  Testing/Procedures:  NONE  Follow-Up:  Your physician recommends that you schedule a follow-up appointment in: 6-8 weeks.  Any Other Special Instructions Will Be Listed Below (If Applicable).  If you need a refill on your cardiac medications before your next appointment, please call your pharmacy. 

## 2019-03-08 NOTE — Progress Notes (Signed)
Cardiology Office Note  Date: 03/08/2019   ID: Mattisyn, Denherder Apr 07, 1946, MRN [id number]  PCP:  Renee Rival, NP  Evaluating Cardiologist:  Rozann Lesches, MD Electrophysiologist:  None   Chief Complaint  Patient presents with  . Hospitalization Follow-up    History of Present Illness: Amber Herman is a 73 y.o. female that I saw recently in the hospital during evaluation for atrial fibrillation and NSTEMI.  He has been followed by a cardiologist in [facility], Dr. Donn Pierini.  Office follow-up was scheduled with me after her discharge from [facility].  Catheterization was performed by Dr. Burt Knack on September 22 demonstrating moderate mid LAD disease after the origin of the second diagonal, widely patent left main, circumflex, and RCA.  This was felt to be best managed medically.  She subsequently underwent a TEE guided cardioversion with restoration of sinus rhythm and was initiated on amiodarone for rhythm control.  She has been on Eliquis for stroke prophylaxis.  I reviewed all of her recent cardiac testing as outlined below.  She is here today with her daughter for a follow-up visit.  She states that she has had some intermittent discomfort under her left arm, thought that it might be indigestion, although it is possible that she has had some angina symptoms as well in light of overall situation.  The symptoms have improved in the last few days and she has not taken nitroglycerin.  She does not report any palpitations or unusual shortness of breath.  I personally reviewed her ECG today which shows sinus bradycardia with nonspecific ST-T changes and heart rate of 58 bpm.  I reviewed her medications which are outlined below.  She is tolerating amiodarone at 200 mg daily.  Recent LFTs and TSH were normal.  She also had a BMET done in early October that showed normal renal function and potassium.  Past Medical History:  Diagnosis Date  . Allergic rhinitis   .  Anemia   . Atrial fibrillation ([place])    Diagnosed 2013  . DM (diabetes mellitus), type 2 ([place])   . GERD (gastroesophageal reflux disease)   . Hyperlipidemia   . Hypertension   . Nephrolithiasis   . Transient ischemic attack (TIA)   . Vitamin D deficiency     Past Surgical History:  Procedure Laterality Date  . CARDIOVERSION N/A 02/22/2019   Procedure: CARDIOVERSION;  Surgeon: Donato Heinz, MD;  Location[facility] ENDOSCOPY;  Service: Endoscopy;  Laterality: N/A;  . CHOLECYSTECTOMY    . LEFT HEART CATH AND CORONARY ANGIOGRAPHY N/A 02/21/2019   Procedure: LEFT HEART CATH AND CORONARY ANGIOGRAPHY;  Surgeon: [name], MD;  Location: [place] CV LAB;  Service: Cardiovascular;  Laterality: N/A;  . TEE WITHOUT CARDIOVERSION N/A 02/22/2019   Procedure: TRANSESOPHAGEAL ECHOCARDIOGRAM (TEE);  Surgeon: Donato Heinz, MD;  Location: [facility] ENDOSCOPY;  Service: Endoscopy;  Laterality: N/A;    Current Outpatient Medications  Medication Sig Dispense Refill  . acetaminophen (TYLENOL) 325 MG tablet Take 2 tablets (650 mg total) by mouth every 8 (eight) hours as needed. 60 tablet 0  . amiodarone (PACERONE) 200 MG tablet Take 1 tablet (200 mg total) by mouth daily. 30 tablet 2  . apixaban (ELIQUIS) 5 MG TABS tablet Take 1 tablet (5 mg total) by mouth 2 (two) times daily. 60 tablet 1  . aspirin EC 81 MG tablet Take 1 tablet (81 mg total) by mouth daily. 30 tablet 1  . carvedilol (COREG) 12.5 MG tablet  Take 1 tablet (12.5 mg total) by mouth 2 (two) times daily with a meal. 60 tablet 2  . chlorthalidone (HYGROTON) 50 MG tablet Take 50 mg by mouth daily.    . FEROSUL 325 (65 Fe) MG tablet Take 325 mg by mouth daily.     Marland Kitchen losartan (COZAAR) 50 MG tablet Take 1 tablet (50 mg total) by mouth daily. 30 tablet 2  . metFORMIN (GLUCOPHAGE) 850 MG tablet Take 850 mg by mouth 2 (two) times daily with a meal.     . nitroGLYCERIN (NITROSTAT) 0.4 MG SL tablet Place 1 tablet (0.4 mg total) under the  tongue every 5 (five) minutes as needed for chest pain (CP or SOB). 9 tablet 2  . pantoprazole (PROTONIX) 40 MG tablet Take 40 mg by mouth daily.    . Potassium Chloride Crys ER (KLOR-CON M20 PO) Take 1 tablet by mouth daily.    . pravastatin (PRAVACHOL) 40 MG tablet Take 40 mg by mouth at bedtime.     . traMADol (ULTRAM) 50 MG tablet Take 50 mg by mouth every 8 (eight) hours as needed for moderate pain.    . TRESIBA FLEXTOUCH 200 UNIT/ML SOPN Inject 14 Units into the skin at bedtime.     . triamcinolone cream (KENALOG) 0.1 % Apply 1 application topically See admin instructions. 1 application to affected area twice daily, Mix 1:1 with Eucerin externally    . vitamin B-12 (CYANOCOBALAMIN) 1000 MCG tablet Take 1,000 mcg by mouth daily.      No current facility-administered medications for this visit.    Allergies:  Amlodipine, Angiotensin receptor blockers, Calcium channel blockers, Latex, Nicardipine hcl, Simvastatin, Sulfa antibiotics, Sulfacetamide sodium, and Sulfasalazine   Social History: The patient  reports that she has never smoked. She has never used smokeless tobacco. She reports that she does not drink alcohol or use drugs.   ROS:  Please see the history of present illness. Otherwise, complete review of systems is positive for none.  All other systems are reviewed and negative.   Physical Exam: VS:  BP (!) 148/58   Pulse 61   Ht 5\' 4"  (1.626 m)   Wt 144 lb (65.3 kg)   SpO2 98%   BMI 24.72 kg/m , BMI Body mass index is 24.72 kg/m.  Wt Readings from Last 3 Encounters:  03/08/19 144 lb (65.3 kg)  02/23/19 144 lb 6.4 oz (65.5 kg)  02/04/19 148 lb (67.1 kg)    General: Elderly woman, appears comfortable at rest. HEENT: Conjunctiva and lids normal, wearing a mask. Neck: Supple, no elevated JVP or carotid bruits, no thyromegaly. Lungs: Clear to auscultation, nonlabored breathing at rest. Cardiac: Regular rate and rhythm, no S3, soft systolic murmur. Abdomen: Soft, nontender,  bowel sounds present. Extremities: No pitting edema, distal pulses 2+. Skin: Warm and dry. Musculoskeletal: Mild kyphosis. Neuropsychiatric: Alert and oriented x3, affect grossly appropriate.  ECG:  An ECG dated 02/22/2019 was personally reviewed today and demonstrated:  Sinus bradycardia with nonspecific ST changes.  Recent Labwork: 02/20/2019: ALT 30; AST 26 02/21/2019: Hemoglobin 10.8; Platelets 443 02/23/2019: TSH 4.818 03/02/2019: BUN 30; Creatinine, Ser 0.84; Potassium 4.0; Sodium 129     Component Value Date/Time   CHOL 136 02/21/2019 0018   TRIG 145 02/21/2019 0018   HDL 40 (L) 02/21/2019 0018   CHOLHDL 3.4 02/21/2019 0018   VLDL 29 02/21/2019 0018   LDLCALC 67 02/21/2019 0018    Other Studies Reviewed Today:  Cardiac catheterization 02/21/2019: 1. Moderate mid-LAD stenosis after  the origin of the second diagonal branch - favor medical therapy 2. Widely patent left main, left circumflex, and RCA 3. Normal LV systolic function and normal LVEDP  Pt with low BP throughout the procedure but awake and stable. Treated with fluid bolus and discontinuation of IV NTG.  Will review with rounding team. Will resume eliquis tonight. Consider TEE/cardioversion tomorrow.   Echocardiogram 02/21/2019:  1. Left ventricular ejection fraction, by visual estimation, is 60 to 65%. The left ventricle has normal function. Normal left ventricular size. There is mildly increased left ventricular hypertrophy.  2. Left ventricular diastolic function could not be evaluated pattern of LV diastolic filling.  3. Global right ventricle has normal systolic function.The right ventricular size is normal. No increase in right ventricular wall thickness.  4. Left atrial size was normal.  5. Right atrial size was normal.  6. Mild mitral annular calcification.  7. The mitral valve is normal in structure. No evidence of mitral valve regurgitation. No evidence of mitral stenosis.  8. The tricuspid valve is normal  in structure. Tricuspid valve regurgitation was not visualized by color flow Doppler.  9. The aortic valve is normal in structure. Aortic valve regurgitation was not visualized by color flow Doppler. Mild aortic valve sclerosis without stenosis. 10. The pulmonic valve was grossly normal. Pulmonic valve regurgitation is not visualized by color flow Doppler. 11. Normal pulmonary artery systolic pressure. 12. The inferior vena cava is normal in size with greater than 50% respiratory variability, suggesting right atrial pressure of 3 mmHg.  TEE 02/22/2019:  1. Left ventricular ejection fraction, by visual estimation, is 60 to 65%. The left ventricle has normal function. Normal left ventricular size. There is no left ventricular hypertrophy.  2. Global right ventricle has normal systolic function.The right ventricular size is normal. No increase in right ventricular wall thickness.  3. Left atrial size was normal.  4. No left atrial appendage thrombus.  5. The mitral valve is normal in structure. Mild mitral valve regurgitation.  6. The tricuspid valve is normal in structure. Tricuspid valve regurgitation is mild.  7. The aortic valve is tricuspid Aortic valve regurgitation was not visualized by color flow Doppler.  8. The pulmonic valve was grossly normal. Pulmonic valve regurgitation is trivial by color flow Doppler.  Assessment and Plan:  1.  Persistent atrial fibrillation with CHADSVASC score of 7.  She is maintaining sinus rhythm following TEE guided cardioversion in September as discussed above.  Plan is to continue Eliquis for stroke prophylaxis, low-dose beta-blocker, and amiodarone at 200 mg daily for rhythm suppression.  Recent TSH and LFTs normal.  2.  CAD status post recent cardiac catheterization demonstrating moderate mid LAD disease that is being managed medically.  She does describe possible angina symptoms although reflux as well.  We will continue with beta-blocker, aspirin given  recent demand ischemia, ARB, and Pravachol.  May need to consider addition of long-acting nitrate depending on symptoms overall.  3.  Mixed hyperlipidemia, currently on Pravachol.  Recent LDL 67.  4.  Essential hypertension, systolic in the 0000000 today.  Continue with current medications for now and keep follow-up with PCP.  Medication Adjustments/Labs and Tests Ordered: Current medicines are reviewed at length with the patient today.  Concerns regarding medicines are outlined above.   Tests Ordered: Orders Placed This Encounter  Procedures  . EKG 12-Lead    Medication Changes: No orders of the defined types were placed in this encounter.   Disposition:  Follow up 6 to 8  weeks in the [place] office.  Signed, Satira Sark, MD, [facility] 03/08/2019 10:33 AM    [place] [facility] [place], [place], [place] [address] Phone: [phone]; Fax: [phone]

## 2019-03-14 ENCOUNTER — Ambulatory Visit: Payer: Medicare Other | Admitting: Physician Assistant

## 2019-03-20 ENCOUNTER — Telehealth: Payer: Self-pay | Admitting: Cardiology

## 2019-03-20 NOTE — Telephone Encounter (Signed)
Please verify current medications and doses.  If she has a regular log of blood pressure from home that would also be helpful.

## 2019-03-20 NOTE — Telephone Encounter (Signed)
Returned call

## 2019-03-20 NOTE — Telephone Encounter (Signed)
BP today is 162/70 HR 59 - says its been running like this for over a week - denies SOB/dizziness/swelling/weight gain/chest pain - says right side of her neck has been aching - says she has also contacted pcp ([facility]) regarding recent insulin - has f/u with Dr Domenic Polite in November

## 2019-03-20 NOTE — Telephone Encounter (Signed)
Patient states that her BP continues to run high.States that she came off of a medication and this is when her BP started going up.

## 2019-03-20 NOTE — Telephone Encounter (Signed)
Pt will keep BP log over the next few days and call us back

## 2019-03-20 NOTE — Telephone Encounter (Signed)
Pt verified that she is taking carvedilol 12.5 mg bid - losartan 50 mg daily - amiodarone 200 mg daily - eliquis 5 mg bid - aspirin 81 mg daily

## 2019-03-23 ENCOUNTER — Other Ambulatory Visit: Payer: Self-pay | Admitting: Cardiology

## 2019-03-23 MED ORDER — LOSARTAN POTASSIUM 50 MG PO TABS: 75.0000 mg | ORAL_TABLET | Freq: Every day | ORAL | 3 refills | Status: DC

## 2019-03-23 NOTE — Telephone Encounter (Signed)
Increase losartan to 75 mg daily, continue to track blood pressure.

## 2019-03-23 NOTE — Telephone Encounter (Signed)
Pt voiced understanding - updated medication list - pt will continue to monitor BP

## 2019-03-23 NOTE — Addendum Note (Signed)
Addended by: Julian Hy T on: 03/23/2019 10:00 AM   Modules accepted: Orders

## 2019-03-23 NOTE — Telephone Encounter (Signed)
BP 10/20 168/62 HR 59        10/21 140/59 HR 62       10/22 172/53 HR 62

## 2019-03-24 ENCOUNTER — Telehealth: Payer: Self-pay | Admitting: *Deleted

## 2019-03-24 NOTE — Telephone Encounter (Signed)
Pt aware and will continue to monitor BP and symptoms on increase of losartan - pt will also contact pcp since she was just started on lexapro 4 days ago

## 2019-03-24 NOTE — Telephone Encounter (Signed)
BP 184/71 HR 65 this morning - pt c/o sweating last night more this morning saturating her clothes - denies dizziness/chest pain/SOB - says she has been taking 75 mg losartan daily as directed and is concerned that this is causing her to sweat and not helping her BP - aware that increase of losartan may take some time - will forward to provider

## 2019-03-24 NOTE — Telephone Encounter (Signed)
I would not necessarily expect an increase in Cozaar from 50 mg daily to 75 mg daily to result in diaphoresis, particularly after 1 dose.  I am hopeful that her blood pressure will start to get better with the increase in medication.  If she does not tolerate it, we will have to think about a different therapy.

## 2019-03-26 ENCOUNTER — Emergency Department (HOSPITAL_COMMUNITY): Payer: Medicare Other

## 2019-03-26 ENCOUNTER — Encounter (HOSPITAL_COMMUNITY): Payer: Self-pay | Admitting: *Deleted

## 2019-03-26 ENCOUNTER — Observation Stay (HOSPITAL_COMMUNITY)
Admission: EM | Admit: 2019-03-26 | Discharge: 2019-03-27 | Disposition: A | Discharge status: Home / Self Care | Payer: Medicare Other | Attending: Family Medicine | Admitting: Family Medicine

## 2019-03-26 ENCOUNTER — Other Ambulatory Visit: Payer: Self-pay

## 2019-03-26 DIAGNOSIS — I480 Paroxysmal atrial fibrillation: Secondary | ICD-10-CM | POA: Diagnosis present

## 2019-03-26 DIAGNOSIS — E7800 Pure hypercholesterolemia, unspecified: Secondary | ICD-10-CM | POA: Insufficient documentation

## 2019-03-26 DIAGNOSIS — I25110 Atherosclerotic heart disease of native coronary artery with unstable angina pectoris: Secondary | ICD-10-CM | POA: Diagnosis not present

## 2019-03-26 DIAGNOSIS — K219 Gastro-esophageal reflux disease without esophagitis: Secondary | ICD-10-CM | POA: Diagnosis not present

## 2019-03-26 DIAGNOSIS — E785 Hyperlipidemia, unspecified: Secondary | ICD-10-CM | POA: Insufficient documentation

## 2019-03-26 DIAGNOSIS — Z794 Long term (current) use of insulin: Secondary | ICD-10-CM | POA: Diagnosis not present

## 2019-03-26 DIAGNOSIS — Z7982 Long term (current) use of aspirin: Secondary | ICD-10-CM | POA: Insufficient documentation

## 2019-03-26 DIAGNOSIS — R079 Chest pain, unspecified: Secondary | ICD-10-CM | POA: Diagnosis not present

## 2019-03-26 DIAGNOSIS — Z20828 Contact with and (suspected) exposure to other viral communicable diseases: Secondary | ICD-10-CM | POA: Insufficient documentation

## 2019-03-26 DIAGNOSIS — I200 Unstable angina: Secondary | ICD-10-CM

## 2019-03-26 DIAGNOSIS — E871 Hypo-osmolality and hyponatremia: Secondary | ICD-10-CM | POA: Diagnosis present

## 2019-03-26 DIAGNOSIS — Z8673 Personal history of transient ischemic attack (TIA), and cerebral infarction without residual deficits: Secondary | ICD-10-CM | POA: Insufficient documentation

## 2019-03-26 DIAGNOSIS — I252 Old myocardial infarction: Secondary | ICD-10-CM | POA: Insufficient documentation

## 2019-03-26 DIAGNOSIS — E876 Hypokalemia: Secondary | ICD-10-CM | POA: Diagnosis not present

## 2019-03-26 DIAGNOSIS — Z79899 Other long term (current) drug therapy: Secondary | ICD-10-CM | POA: Diagnosis not present

## 2019-03-26 DIAGNOSIS — I119 Hypertensive heart disease without heart failure: Secondary | ICD-10-CM | POA: Insufficient documentation

## 2019-03-26 DIAGNOSIS — Z8249 Family history of ischemic heart disease and other diseases of the circulatory system: Secondary | ICD-10-CM | POA: Diagnosis not present

## 2019-03-26 DIAGNOSIS — Z7901 Long term (current) use of anticoagulants: Secondary | ICD-10-CM | POA: Insufficient documentation

## 2019-03-26 DIAGNOSIS — E119 Type 2 diabetes mellitus without complications: Secondary | ICD-10-CM

## 2019-03-26 DIAGNOSIS — I351 Nonrheumatic aortic (valve) insufficiency: Secondary | ICD-10-CM | POA: Insufficient documentation

## 2019-03-26 LAB — CBC
HCT: 34 % — ABNORMAL LOW (ref 36.0–46.0)
Hemoglobin: 11.3 g/dL — ABNORMAL LOW (ref 12.0–15.0)
MCH: 30.2 pg (ref 26.0–34.0)
MCHC: 33.2 g/dL (ref 30.0–36.0)
MCV: 90.9 fL (ref 80.0–100.0)
Platelets: 423 10*3/uL — ABNORMAL HIGH (ref 150–400)
RBC: 3.74 MIL/uL — ABNORMAL LOW (ref 3.87–5.11)
RDW: 13 % (ref 11.5–15.5)
WBC: 9.8 10*3/uL (ref 4.0–10.5)
nRBC: 0 % (ref 0.0–0.2)

## 2019-03-26 LAB — BASIC METABOLIC PANEL
Anion gap: 17 — ABNORMAL HIGH (ref 5–15)
BUN: 20 mg/dL (ref 8–23)
CO2: 23 mmol/L (ref 22–32)
Calcium: 9.4 mg/dL (ref 8.9–10.3)
Chloride: 86 mmol/L — ABNORMAL LOW (ref 98–111)
Creatinine, Ser: 1.05 mg/dL — ABNORMAL HIGH (ref 0.44–1.00)
GFR calc Af Amer: >60 mL/min (ref >60)
GFR calc non Af Amer: 53 mL/min — ABNORMAL LOW (ref >60)
Glucose, Bld: 133 mg/dL — ABNORMAL HIGH (ref 70–99)
Potassium: 3.3 mmol/L — ABNORMAL LOW (ref 3.5–5.1)
Sodium: 126 mmol/L — ABNORMAL LOW (ref 135–145)

## 2019-03-26 LAB — TROPONIN I (HIGH SENSITIVITY)
Troponin I (High Sensitivity): 5 ng/L (ref <18)
Troponin I (High Sensitivity): 8 ng/L (ref <18)

## 2019-03-26 LAB — CBG MONITORING, ED: Glucose-Capillary: 127 mg/dL — ABNORMAL HIGH (ref 70–99)

## 2019-03-26 MED ORDER — ACETAMINOPHEN 650 MG RE SUPP: 650.0000 mg | Freq: Four times a day (QID) | RECTAL | Status: DC | PRN

## 2019-03-26 MED ORDER — SODIUM CHLORIDE 0.9 % IV SOLN: 250.0000 mL | INTRAVENOUS | Status: DC | PRN

## 2019-03-26 MED ORDER — NITROGLYCERIN 0.4 MG SL SUBL
0.4000 mg | SUBLINGUAL_TABLET | SUBLINGUAL | Status: DC | PRN
  Administered 2019-03-26 (×2): 0.4 mg via SUBLINGUAL
  Filled 2019-03-26: qty 1

## 2019-03-26 MED ORDER — MORPHINE SULFATE (PF) 2 MG/ML IV SOLN
2.0000 mg | Freq: Once | INTRAVENOUS | Status: AC
  Administered 2019-03-26: 2 mg via INTRAVENOUS
  Filled 2019-03-26: qty 1

## 2019-03-26 MED ORDER — SODIUM CHLORIDE 0.9 % IV SOLN
INTRAVENOUS | Status: AC
  Administered 2019-03-27: 01:00:00 via INTRAVENOUS

## 2019-03-26 MED ORDER — ONDANSETRON HCL 4 MG/2ML IJ SOLN
4.0000 mg | Freq: Once | INTRAMUSCULAR | Status: AC
  Administered 2019-03-26: 4 mg via INTRAVENOUS
  Filled 2019-03-26: qty 2

## 2019-03-26 MED ORDER — ASPIRIN 81 MG PO CHEW
324.0000 mg | CHEWABLE_TABLET | Freq: Once | ORAL | Status: AC
  Administered 2019-03-26: 324 mg via ORAL
  Filled 2019-03-26: qty 4

## 2019-03-26 MED ORDER — SODIUM CHLORIDE 0.9% FLUSH
3.0000 mL | Freq: Two times a day (BID) | INTRAVENOUS | Status: DC
  Administered 2019-03-27: 3 mL via INTRAVENOUS

## 2019-03-26 MED ORDER — ACETAMINOPHEN 325 MG PO TABS: 650.0000 mg | ORAL_TABLET | Freq: Four times a day (QID) | ORAL | Status: DC | PRN

## 2019-03-26 MED ORDER — SODIUM CHLORIDE 0.9% FLUSH: 3.0000 mL | INTRAVENOUS | Status: DC | PRN

## 2019-03-26 NOTE — ED Provider Notes (Signed)
[facility] EMERGENCY DEPARTMENT Provider Note   CSN: [id number] Arrival date & time: 03/26/19  2011     History   Chief Complaint Chief Complaint  Patient presents with  . Pleurisy    HPI Amber Herman is a 73 y.o. female with a history of a fib, MI, DM, GERD, HTN and hyperlipidemia presenting with chest pain, nausea and lightheadedness.  Her symptoms started 5 days ago and have been intermittent.  She describes left-sided chest aching pain in association with nausea.  These episodes have been intermittent, she has not found any alleviators or aggravators.  She denies symptoms are exertional.  She had one episode of escalating nausea with chest pain just prior to arrival which resolved with emesis.  She denies shortness of breath, there are no pleuritic symptoms.  She denies fevers or chills.  She was admitted to [facility] 1 month ago where she was treated for a non-STEMI and evaluated for A. fib with RVR.  She underwent catheterization on 9/22 which showed moderate LAD disease which is being medically managed.  She also underwent cardioversion for her afib during that hospitalization.      HPI: A 73 year old patient with a history of treated diabetes, hypertension and hypercholesterolemia presents for evaluation of chest pain. Initial onset of pain was more than 6 hours ago. The patient's chest pain is not worse with exertion. The patient complains of nausea. The patient's chest pain is middle- or left-sided, is not well-localized, is not described as heaviness/pressure/tightness, is not sharp and does not radiate to the arms/jaw/neck. The patient denies diaphoresis. The patient has no history of stroke, has no history of peripheral artery disease, has not smoked in the past 90 days, has no relevant family history of coronary artery disease (first degree relative at less than age 63) and does not have an elevated BMI (>=30).   The history is provided by the patient.    Past Medical History:   Diagnosis Date  . Allergic rhinitis   . Anemia   . Atrial fibrillation ([place])    Diagnosed 2013  . DM (diabetes mellitus), type 2 ([place])   . GERD (gastroesophageal reflux disease)   . Hyperlipidemia   . Hypertension   . Nephrolithiasis   . Transient ischemic attack (TIA)   . Vitamin D deficiency     Patient Active Problem List   Diagnosis Date Noted  . Persistent atrial fibrillation ([place])   . Demand ischemia ([place])   . Atrial fibrillation with RVR ([place]) 02/20/2019  . NSTEMI (non-ST elevated myocardial infarction) ([place]) 11/02/2017  . AF (paroxysmal atrial fibrillation) ([place]) 02/27/2017  . Pyelonephritis 02/26/2017  . TIA (transient ischemic attack) 06/29/2014  . Long term (current) use of anticoagulants 11/30/2011  . Anemia 11/26/2011  . Atrial fibrillation with rapid ventricular response ([place]) 11/25/2011  . Hyponatremia 11/25/2011  . Nausea & vomiting 11/25/2011  . Diarrhea 11/25/2011  . Hypomagnesemia 11/25/2011  . Diabetes mellitus, type II ([place]) 11/25/2011  . Hypertension 11/25/2011    Past Surgical History:  Procedure Laterality Date  . CARDIOVERSION N/A 02/22/2019   Procedure: CARDIOVERSION;  Surgeon: Donato Heinz, MD;  Location: [facility] ENDOSCOPY;  Service: Endoscopy;  Laterality: N/A;  . CHOLECYSTECTOMY    . LEFT HEART CATH AND CORONARY ANGIOGRAPHY N/A 02/21/2019   Procedure: LEFT HEART CATH AND CORONARY ANGIOGRAPHY;  Surgeon: [name], MD;  Location: [facility] CV LAB;  Service: Cardiovascular;  Laterality: N/A;  . TEE WITHOUT CARDIOVERSION N/A 02/22/2019   Procedure: TRANSESOPHAGEAL ECHOCARDIOGRAM (  TEE);  Surgeon: Donato Heinz, MD;  Location: [facility] ENDOSCOPY;  Service: Endoscopy;  Laterality: N/A;     OB History    Gravida  4   Para  3   Term  3   Preterm      AB  1   Living  3     SAB  1   TAB      Ectopic      Multiple      Live Births               Home Medications    Prior to Admission medications    Medication Sig Start Date End Date Taking? Authorizing Provider  acetaminophen (TYLENOL) 325 MG tablet Take 2 tablets (650 mg total) by mouth every 8 (eight) hours as needed. 11/19/17   Mesner, Corene Cornea, MD  amiodarone (PACERONE) 200 MG tablet Take 1 tablet (200 mg total) by mouth daily. 02/23/19 03/25/19  Shahmehdi, Valeria Batman, MD  apixaban (ELIQUIS) 5 MG TABS tablet Take 1 tablet (5 mg total) by mouth 2 (two) times daily. 11/03/17   Manuella Ghazi, Pratik D, DO  aspirin EC 81 MG tablet Take 1 tablet (81 mg total) by mouth daily. 06/30/14   Kathie Dike, MD  carvedilol (COREG) 12.5 MG tablet Take 1 tablet (12.5 mg total) by mouth 2 (two) times daily with a meal. 02/23/19 03/25/19  Shahmehdi, Valeria Batman, MD  chlorthalidone (HYGROTON) 50 MG tablet TAKE 1 TABLET BY MOUTH IN THE MORNING. 03/23/19   Satira Sark, MD  FEROSUL 325 (65 Fe) MG tablet Take 325 mg by mouth daily.  02/03/19   [provider]  losartan (COZAAR) 50 MG tablet Take 1.5 tablets (75 mg total) by mouth daily. 03/23/19 04/22/19  Satira Sark, MD  metFORMIN (GLUCOPHAGE) 850 MG tablet Take 850 mg by mouth 2 (two) times daily with a meal.  01/16/19   [provider]  nitroGLYCERIN (NITROSTAT) 0.4 MG SL tablet Place 1 tablet (0.4 mg total) under the tongue every 5 (five) minutes as needed for chest pain (CP or SOB). 02/23/19   Shahmehdi, Valeria Batman, MD  pantoprazole (PROTONIX) 40 MG tablet Take 40 mg by mouth daily.    [provider]  Potassium Chloride Crys ER (KLOR-CON M20 PO) Take 1 tablet by mouth daily.    [provider]  pravastatin (PRAVACHOL) 40 MG tablet Take 40 mg by mouth at bedtime.     [provider]  traMADol (ULTRAM) 50 MG tablet Take 50 mg by mouth every 8 (eight) hours as needed for moderate pain.    [provider]  TRESIBA FLEXTOUCH 200 UNIT/ML SOPN Inject 14 Units into the skin at bedtime.  02/10/17   [provider]  triamcinolone cream (KENALOG) 0.1 % Apply 1  application topically See admin instructions. 1 application to affected area twice daily, Mix 1:1 with Eucerin externally 12/26/18   [provider]  vitamin B-12 (CYANOCOBALAMIN) 1000 MCG tablet Take 1,000 mcg by mouth daily.  01/16/19   [provider]    Family History Family History  Problem Relation Age of Onset  . Hypertension Father     Social History Social History   Tobacco Use  . Smoking status: Never Smoker  . Smokeless tobacco: Never Used  Substance Use Topics  . Alcohol use: No  . Drug use: No     Allergies   Amlodipine, Angiotensin receptor blockers, Calcium channel blockers, Latex, Nicardipine hcl, Simvastatin, Sulfa antibiotics, Sulfacetamide sodium, and  Sulfasalazine   Review of Systems Review of Systems  Constitutional: Negative for chills and fever.  HENT: Negative for congestion and sore throat.   Eyes: Negative.   Respiratory: Negative for chest tightness and shortness of breath.   Cardiovascular: Positive for chest pain.  Gastrointestinal: Positive for nausea and vomiting. Negative for abdominal pain.  Genitourinary: Negative.   Musculoskeletal: Negative for arthralgias, joint swelling and neck pain.  Skin: Negative.  Negative for rash and wound.  Neurological: Positive for light-headedness. Negative for dizziness, weakness, numbness and headaches.  Psychiatric/Behavioral: Negative.      Physical Exam Updated Vital Signs BP (!) 132/49   Pulse (!) 55   Temp (!) 97.5 F (36.4 C) (Oral)   Resp 15   Ht 5\' 4"  (1.626 m)   Wt 65.3 kg   SpO2 97%   BMI 24.72 kg/m   Physical Exam Vitals signs and nursing note reviewed.  Constitutional:      Appearance: She is well-developed.  HENT:     Head: Normocephalic and atraumatic.  Eyes:     Conjunctiva/sclera: Conjunctivae normal.  Neck:     Musculoskeletal: Normal range of motion.  Cardiovascular:     Rate and Rhythm: Normal rate and regular rhythm.     Heart sounds: Normal heart  sounds.  Pulmonary:     Effort: Pulmonary effort is normal.     Breath sounds: Normal breath sounds. No wheezing.  Abdominal:     General: Bowel sounds are normal.     Palpations: Abdomen is soft.     Tenderness: There is no abdominal tenderness.  Musculoskeletal: Normal range of motion.  Skin:    General: Skin is warm and dry.  Neurological:     Mental Status: She is alert.      ED Treatments / Results  Labs (all labs ordered are listed, but only abnormal results are displayed) Labs Reviewed  BASIC METABOLIC PANEL - Abnormal; Notable for the following components:      Result Value   Sodium 126 (*)    Potassium 3.3 (*)    Chloride 86 (*)    Glucose, Bld 133 (*)    Creatinine, Ser 1.05 (*)    GFR calc non Af Amer 53 (*)    Anion gap 17 (*)    All other components within normal limits  CBC - Abnormal; Notable for the following components:   RBC 3.74 (*)    Hemoglobin 11.3 (*)    HCT 34.0 (*)    Platelets 423 (*)    All other components within normal limits  CBG MONITORING, ED - Abnormal; Notable for the following components:   Glucose-Capillary 127 (*)    All other components within normal limits  TROPONIN I (HIGH SENSITIVITY)  TROPONIN I (HIGH SENSITIVITY)    EKG EKG Interpretation  Date/Time:  Sunday March 26 2019 20:21:21 EDT Ventricular Rate:  59 PR Interval:    QRS Duration: 103 QT Interval:  460 QTC Calculation: 456 R Axis:   52 Text Interpretation:  Sinus rhythm Ventricular premature complex Confirmed by Milton Ferguson [phone]) on 03/26/2019 8:48:16 PM   Radiology Dg Chest Port 1 View  Result Date: 03/26/2019 CLINICAL DATA:  Left-sided chest pain EXAM: PORTABLE CHEST 1 VIEW COMPARISON:  02/19/2019 FINDINGS: Cardiomegaly. Both lungs are clear. The visualized skeletal structures are unremarkable. IMPRESSION: Cardiomegaly without acute abnormality of the lungs. Electronically Signed   By: Eddie Candle M.D.   On: 03/26/2019 21:08    Procedures  Procedures (including critical  care time)  Medications Ordered in ED Medications  nitroGLYCERIN (NITROSTAT) SL tablet 0.4 mg (0.4 mg Sublingual Given 03/26/19 2132)  aspirin chewable tablet 324 mg (324 mg Oral Given 03/26/19 2058)  morphine 2 MG/ML injection 2 mg (2 mg Intravenous Given 03/26/19 2221)  ondansetron (ZOFRAN) injection 4 mg (4 mg Intravenous Given 03/26/19 2221)     Initial Impression / Assessment and Plan / ED Course  I have reviewed the triage vital signs and the nursing notes.  Pertinent labs & imaging results that were available during my care of the patient were reviewed by me and considered in my medical decision making (see chart for details).     HEAR Score: 5  Patient presenting with chest pain, nausea and vomiting with significant ACS.  Concern for possible unstable angina.  Her heart score is 5, her delta troponins are stable.  She was given nitroglycerin tablets x2 which improved her blood pressure, and transiently improved her chest pain, however she required morphine IV in order to completely eliminate her chest pain.  She would benefit from at least an overnight observation given her risk factors, heart score 5.  Will call for admission.  Final Clinical Impressions(s) / ED Diagnoses   Final diagnoses:  Unstable angina [facility])    ED Discharge Orders    None       Landis Martins 03/26/19 2359    Milton Ferguson, MD 03/28/19 1154

## 2019-03-26 NOTE — ED Triage Notes (Signed)
Pt c/o intermittent left side chest pain that started 4 days ago, pain is also associated with nausea, diaphoresis as well,

## 2019-03-26 NOTE — H&P (Addendum)
TRH H&P    Patient Demographics:    Amber Herman, is a 73 y.o. female  MRN: [id number]  DOB - 10/18/1945  Admit Date - 03/26/2019  Referring MD/NP/PA:  Evalee Jefferson  Outpatient Primary MD for the patient is Renee Rival, NP  Patient coming from:  home  Chief complaint- chest pain    HPI:    Amber Herman  is a 73 y.o. female,  w hypertension, hyperlipidemia, Dm2, TIA, Pafib, CAD w NSTEMI , s/p cardiac cath 02/21/2019-> mid- distal 50% LAD, presents with c/o chest pain for the past 3-4 days. Intermittent, left sided, "achy" .  Pt states that the pain began about 9am yesterday morning at rest.  Pt was concerned due to pain not resolving and therefore presented to ED.  slg nitro with partial relief.  1 episode of n/v  Pt denies fever, chills, cough, palp, abd pain, diarrhea, brbpr, black stool. Nothing appeared to make the pain in particular better or worse.    In ED,  T 97.5  p 62  R 18 Bp 203/60  Pox 99% on RA Wt 65.3 kg  CXR IMPRESSION: Cardiomegaly without acute abnormality of the lungs.  Na 126, K 3.3, Bun 20, Creatinine 1.05 Glucose 133  Wbc 9.8, Hgb 11.3, Plt 423  Trop 5-> 8  EKG nsr at 60, nl axis,  Nl int, no st-t changes c/w ischemia  Pt will be admitted for chest pain .    Review of systems:    In addition to the HPI above,  No Fever-chills, No Headache, No changes with Vision or hearing, No problems swallowing food or Liquids, No Cough or Shortness of Breath, No Abdominal pain, No Nausea or Vomiting, bowel movements are regular, No Blood in stool or Urine, No dysuria, No new skin rashes or bruises, No new joints pains-aches,  No new weakness, tingling, numbness in any extremity, No recent weight gain or loss, No polyuria, polydypsia or polyphagia, No significant Mental Stressors.  All other systems reviewed and are negative.    Past History of the following :    Past Medical History:  Diagnosis Date  . Allergic rhinitis   . Anemia   . Atrial fibrillation ([place])    Diagnosed 2013  . DM (diabetes mellitus), type 2 ([place])   . GERD (gastroesophageal reflux disease)   . Hyperlipidemia   . Hypertension   . Nephrolithiasis   . Transient ischemic attack (TIA)   . Vitamin D deficiency       Past Surgical History:  Procedure Laterality Date  . CARDIOVERSION N/A 02/22/2019   Procedure: CARDIOVERSION;  Surgeon: Donato Heinz, MD;  Location: [facility] ENDOSCOPY;  Service: Endoscopy;  Laterality: N/A;  . CHOLECYSTECTOMY    . LEFT HEART CATH AND CORONARY ANGIOGRAPHY N/A 02/21/2019   Procedure: LEFT HEART CATH AND CORONARY ANGIOGRAPHY;  Surgeon: [name], MD;  Location: [place] [facility] CV LAB;  Service: Cardiovascular;  Laterality: N/A;  . TEE WITHOUT CARDIOVERSION N/A 02/22/2019   Procedure: TRANSESOPHAGEAL ECHOCARDIOGRAM (TEE);  Surgeon: Donato Heinz, MD;  Location: [facility] ENDOSCOPY;  Service: Endoscopy;  Laterality: N/A;      Social History:      Social History   Tobacco Use  . Smoking status: Never Smoker  . Smokeless tobacco: Never Used  Substance Use Topics  . Alcohol use: No       Family History :     Family History  Problem Relation Age of Onset  . Hypertension Father        Home Medications:   Prior to Admission medications   Medication Sig Start Date End Date Taking? Authorizing Provider  acetaminophen (TYLENOL) 325 MG tablet Take 2 tablets (650 mg total) by mouth every 8 (eight) hours as needed. 11/19/17   Mesner, Corene Cornea, MD  amiodarone (PACERONE) 200 MG tablet Take 1 tablet (200 mg total) by mouth daily. 02/23/19 03/25/19  Shahmehdi, Valeria Batman, MD  apixaban (ELIQUIS) 5 MG TABS tablet Take 1 tablet (5 mg total) by mouth 2 (two) times daily. 11/03/17   Manuella Ghazi, Pratik D, DO  aspirin EC 81 MG tablet Take 1 tablet (81 mg total) by mouth daily. 06/30/14   Kathie Dike, MD  carvedilol (COREG) 12.5 MG tablet Take 1 tablet  (12.5 mg total) by mouth 2 (two) times daily with a meal. 02/23/19 03/25/19  Shahmehdi, Valeria Batman, MD  chlorthalidone (HYGROTON) 50 MG tablet TAKE 1 TABLET BY MOUTH IN THE MORNING. 03/23/19   Satira Sark, MD  FEROSUL 325 (65 Fe) MG tablet Take 325 mg by mouth daily.  02/03/19   [provider]  losartan (COZAAR) 50 MG tablet Take 1.5 tablets (75 mg total) by mouth daily. 03/23/19 04/22/19  Satira Sark, MD  metFORMIN (GLUCOPHAGE) 850 MG tablet Take 850 mg by mouth 2 (two) times daily with a meal.  01/16/19   [provider]  nitroGLYCERIN (NITROSTAT) 0.4 MG SL tablet Place 1 tablet (0.4 mg total) under the tongue every 5 (five) minutes as needed for chest pain (CP or SOB). 02/23/19   Shahmehdi, Valeria Batman, MD  pantoprazole (PROTONIX) 40 MG tablet Take 40 mg by mouth daily.    [provider]  Potassium Chloride Crys ER (KLOR-CON M20 PO) Take 1 tablet by mouth daily.    [provider]  pravastatin (PRAVACHOL) 40 MG tablet Take 40 mg by mouth at bedtime.     [provider]  traMADol (ULTRAM) 50 MG tablet Take 50 mg by mouth every 8 (eight) hours as needed for moderate pain.    [provider]  TRESIBA FLEXTOUCH 200 UNIT/ML SOPN Inject 14 Units into the skin at bedtime.  02/10/17   [provider]  triamcinolone cream (KENALOG) 0.1 % Apply 1 application topically See admin instructions. 1 application to affected area twice daily, Mix 1:1 with Eucerin externally 12/26/18   [provider]  vitamin B-12 (CYANOCOBALAMIN) 1000 MCG tablet Take 1,000 mcg by mouth daily.  01/16/19   [provider]     Allergies:     Allergies  Allergen Reactions  . Amlodipine   . Angiotensin Receptor Blockers     Taking losartan at admission  . Calcium Channel Blockers   . Latex Hives and Itching  . Nicardipine Hcl   . Simvastatin   . Sulfa Antibiotics Itching and Rash  . Sulfacetamide Sodium Itching and Rash  . Sulfasalazine  Itching and Rash     Physical Exam:   Vitals  Blood pressure (!) 141/55, pulse (!) 57, temperature (!) 97.5 F (36.4 C), temperature source Oral,  resp. rate 19, height 5\' 4"  (1.626 m), weight 65.3 kg, SpO2 99 %.  1.  General: axoxo3  2. Psychiatric: euthymic  3. Neurologic: Cn2-12 intact, reflexes 2+ symmetric, diffuse with no clonus, motor 5/5 in all 4 ext  4. HEENMT:  Anicteric, pupils 1.35mm symmetric, direct, consensual intact Neck: no jvd  5. Respiratory : CTAB  6. Cardiovascular : rrr s1, s2, 1/6 sem apex  7. Gastrointestinal:  Abd: soft, nt, nd, + bs  8. Skin:  Ext: no c/c/e, no rash  9.Musculoskeletal:  Good ROM    Data Review:    CBC Recent Labs  Lab 03/26/19 2030  WBC 9.8  HGB 11.3*  HCT 34.0*  PLT 423*  MCV 90.9  MCH 30.2  MCHC 33.2  RDW 13.0   ------------------------------------------------------------------------------------------------------------------  Results for orders placed or performed during the hospital encounter of 03/26/19 (from the past 48 hour(s))  CBG monitoring, ED     Status: Abnormal   Collection Time: 03/26/19  8:26 PM  Result Value Ref Range   Glucose-Capillary 127 (H) 70 - 99 mg/dL  Basic metabolic panel     Status: Abnormal   Collection Time: 03/26/19  8:30 PM  Result Value Ref Range   Sodium 126 (L) 135 - 145 mmol/L   Potassium 3.3 (L) 3.5 - 5.1 mmol/L   Chloride 86 (L) 98 - 111 mmol/L   CO2 23 22 - 32 mmol/L   Glucose, Bld 133 (H) 70 - 99 mg/dL   BUN 20 8 - 23 mg/dL   Creatinine, Ser 1.05 (H) 0.44 - 1.00 mg/dL   Calcium 9.4 8.9 - 10.3 mg/dL   GFR calc non Af Amer 53 (L) >60 mL/min   GFR calc Af Amer >60 >60 mL/min   Anion gap 17 (H) 5 - 15    Comment: Performed at [facility], [address] [place], [place] [address]  CBC     Status: Abnormal   Collection Time: 03/26/19  8:30 PM  Result Value Ref Range   WBC 9.8 4.0 - 10.5 K/uL   RBC 3.74 (L) 3.87 - 5.11 MIL/uL   Hemoglobin 11.3 (L) 12.0 - 15.0  g/dL   HCT 34.0 (L) 36.0 - 46.0 %   MCV 90.9 80.0 - 100.0 fL   MCH 30.2 26.0 - 34.0 pg   MCHC 33.2 30.0 - 36.0 g/dL   RDW 13.0 11.5 - 15.5 %   Platelets 423 (H) 150 - 400 K/uL   nRBC 0.0 0.0 - 0.2 %    Comment: Performed at [facility], [address] [place], [place] [address]  Troponin I (High Sensitivity)     Status: None   Collection Time: 03/26/19  8:30 PM  Result Value Ref Range   Troponin I (High Sensitivity) 5 <18 ng/L    Comment: (NOTE) Elevated high sensitivity troponin I (hsTnI) values and significant  changes across serial measurements may suggest ACS but many other  chronic and acute conditions are known to elevate hsTnI results.  Refer to the "Links" section for chest pain algorithms and additional  guidance. Performed at [facility], [address], [place], [place] [address]   Troponin I (High Sensitivity)     Status: None   Collection Time: 03/26/19 10:28 PM  Result Value Ref Range   Troponin I (High Sensitivity) 8 <18 ng/L    Comment: (NOTE) Elevated high sensitivity troponin I (hsTnI) values and significant  changes across serial measurements may suggest ACS but many other  chronic and  acute conditions are known to elevate hsTnI results.  Refer to the "Links" section for chest pain algorithms and additional  guidance. Performed at [facility], [address], [place], [place] [address]   SARS Coronavirus 2 by RT PCR (hospital order, performed in [facility] hospital lab) Nasopharyngeal Nasopharyngeal Swab     Status: None   Collection Time: 03/27/19 12:04 AM   Specimen: Nasopharyngeal Swab  Result Value Ref Range   SARS Coronavirus 2 NEGATIVE NEGATIVE    Comment: (NOTE) If result is NEGATIVE SARS-CoV-2 target nucleic acids are NOT DETECTED. The SARS-CoV-2 RNA is generally detectable in upper and lower  respiratory specimens during the acute phase of infection. The lowest  concentration of SARS-CoV-2 viral copies this assay can detect is 250  copies /  mL. A negative result does not preclude SARS-CoV-2 infection  and should not be used as the sole basis for treatment or other  patient management decisions.  A negative result may occur with  improper specimen collection / handling, submission of specimen other  than nasopharyngeal swab, presence of viral mutation(s) within the  areas targeted by this assay, and inadequate number of viral copies  (<250 copies / mL). A negative result must be combined with clinical  observations, patient history, and epidemiological information. If result is POSITIVE SARS-CoV-2 target nucleic acids are DETECTED. The SARS-CoV-2 RNA is generally detectable in upper and lower  respiratory specimens dur ing the acute phase of infection.  Positive  results are indicative of active infection with SARS-CoV-2.  Clinical  correlation with patient history and other diagnostic information is  necessary to determine patient infection status.  Positive results do  not rule out bacterial infection or co-infection with other viruses. If result is PRESUMPTIVE POSTIVE SARS-CoV-2 nucleic acids MAY BE PRESENT.   A presumptive positive result was obtained on the submitted specimen  and confirmed on repeat testing.  While 2019 novel coronavirus  (SARS-CoV-2) nucleic acids may be present in the submitted sample  additional confirmatory testing may be necessary for epidemiological  and / or clinical management purposes  to differentiate between  SARS-CoV-2 and other Sarbecovirus currently known to infect humans.  If clinically indicated additional testing with an alternate test  methodology [phone]) is advised. The SARS-CoV-2 RNA is generally  detectable in upper and lower respiratory sp ecimens during the acute  phase of infection. The expected result is Negative. Fact Sheet for Patients:  StrictlyIdeas.no Fact Sheet for Healthcare Providers: BankingDealers.co.za This test is  not yet approved or cleared by the [place] FDA and has been authorized for detection and/or diagnosis of SARS-CoV-2 by FDA under an Emergency Use Authorization (EUA).  This EUA will remain in effect (meaning this test can be used) for the duration of the COVID-19 declaration under Section 564(b)(1) of the Act, 21 U.S.C. section 360bbb-3(b)(1), unless the authorization is terminated or revoked sooner. Performed at [facility], [address], [place], [place] [address]   CBG monitoring, ED     Status: Abnormal   Collection Time: 03/27/19 12:55 AM  Result Value Ref Range   Glucose-Capillary 122 (H) 70 - 99 mg/dL    Chemistries  Recent Labs  Lab 03/26/19 2030  NA 126*  K 3.3*  CL 86*  CO2 23  GLUCOSE 133*  BUN 20  CREATININE 1.05*  CALCIUM 9.4   ------------------------------------------------------------------------------------------------------------------  ------------------------------------------------------------------------------------------------------------------ GFR: Estimated Creatinine Clearance: 41.2 mL/min (A) (by C-G formula based on SCr of 1.05 mg/dL (H)). Liver Function Tests: No results for  input(s): AST, ALT, ALKPHOS, BILITOT, PROT, ALBUMIN in the last 168 hours. No results for input(s): LIPASE, AMYLASE in the last 168 hours. No results for input(s): AMMONIA in the last 168 hours. Coagulation Profile: No results for input(s): INR, PROTIME in the last 168 hours. Cardiac Enzymes: No results for input(s): CKTOTAL, CKMB, CKMBINDEX, TROPONINI in the last 168 hours. BNP (last 3 results) No results for input(s): PROBNP in the last 8760 hours. HbA1C: No results for input(s): HGBA1C in the last 72 hours. CBG: Recent Labs  Lab 03/26/19 2026 03/27/19 0055  GLUCAP 127* 122*   Lipid Profile: No results for input(s): CHOL, HDL, LDLCALC, TRIG, CHOLHDL, LDLDIRECT in the last 72 hours. Thyroid Function Tests: No results for input(s): TSH, T4TOTAL, FREET4,  T3FREE, THYROIDAB in the last 72 hours. Anemia Panel: No results for input(s): VITAMINB12, FOLATE, FERRITIN, TIBC, IRON, RETICCTPCT in the last 72 hours.  --------------------------------------------------------------------------------------------------------------- Urine analysis:    Component Value Date/Time   COLORURINE STRAW (A) 06/03/2018 2021   APPEARANCEUR CLEAR 06/03/2018 2021   LABSPEC 1.008 06/03/2018 2021   PHURINE 7.0 06/03/2018 2021   GLUCOSEU NEGATIVE 06/03/2018 2021   HGBUR SMALL (A) 06/03/2018 2021   BILIRUBINUR NEGATIVE 06/03/2018 2021   KETONESUR NEGATIVE 06/03/2018 2021   PROTEINUR NEGATIVE 06/03/2018 2021   UROBILINOGEN 0.2 06/29/2014 1729   NITRITE NEGATIVE 06/03/2018 2021   LEUKOCYTESUR SMALL (A) 06/03/2018 2021      Imaging Results:    Dg Chest Port 1 View  Result Date: 03/26/2019 CLINICAL DATA:  Left-sided chest pain EXAM: PORTABLE CHEST 1 VIEW COMPARISON:  02/19/2019 FINDINGS: Cardiomegaly. Both lungs are clear. The visualized skeletal structures are unremarkable. IMPRESSION: Cardiomegaly without acute abnormality of the lungs. Electronically Signed   By: Eddie Candle M.D.   On: 03/26/2019 21:08       Assessment & Plan:    Principal Problem:   Chest pain Active Problems:   Hyponatremia   Diabetes mellitus, type II (HCC)   AF (paroxysmal atrial fibrillation) (HCC)  Chest pain Tele Trop I  Check cardiac echo  NPO except for medications  Nitro gtt Morphine sulfate 1mg  iv q6h prn chest pain not resolved w nitro Cont aspirin 81mg  po qday Cont Pravastatin 40mg  po qhs Cont Losartan 75mg  po qday Cont Carvedilol 12.5mg  po bid Cardiology consult ordered in computer  [place] to dose Cont Amiodarone 200mg  po qday Cont Carvedilol as above  Hyponatremia ? Secondary to chlorthalidone Hydrate with ns iv Check cm in am  Hypokalemia Replete Check cmp in am  Dm2 Hold Metformin  Hold Treisiba fsbs q4h, ISS  DVT  Prophylaxis-   Lovenox - SCDs   AM Labs Ordered, also please review Full Orders  Family Communication: Admission, patients condition and plan of care including tests being ordered have been discussed with the patient  who indicate understanding and agree with the plan and Code Status.  Code Status:  DNR per patient  Admission status:  observation: Based on patients clinical presentation and evaluation of above clinical data, I have made determination that patient meets observation criteria at this time.   Time spent in minutes : 70 minutes   Jani Gravel M.D on 03/27/2019 at 1:16 AM

## 2019-03-27 ENCOUNTER — Observation Stay (HOSPITAL_BASED_OUTPATIENT_CLINIC_OR_DEPARTMENT_OTHER): Payer: Medicare Other

## 2019-03-27 DIAGNOSIS — I480 Paroxysmal atrial fibrillation: Secondary | ICD-10-CM | POA: Diagnosis not present

## 2019-03-27 DIAGNOSIS — R079 Chest pain, unspecified: Secondary | ICD-10-CM | POA: Diagnosis not present

## 2019-03-27 DIAGNOSIS — I25110 Atherosclerotic heart disease of native coronary artery with unstable angina pectoris: Secondary | ICD-10-CM | POA: Diagnosis not present

## 2019-03-27 DIAGNOSIS — E119 Type 2 diabetes mellitus without complications: Secondary | ICD-10-CM | POA: Diagnosis not present

## 2019-03-27 LAB — URINALYSIS, ROUTINE W REFLEX MICROSCOPIC
Bacteria, UA: NONE SEEN
Bilirubin Urine: NEGATIVE
Glucose, UA: NEGATIVE mg/dL
Ketones, ur: NEGATIVE mg/dL
Leukocytes,Ua: NEGATIVE
Nitrite: NEGATIVE
Protein, ur: NEGATIVE mg/dL
Specific Gravity, Urine: 1.01 (ref 1.005–1.030)
pH: 5 (ref 5.0–8.0)

## 2019-03-27 LAB — CBC
HCT: 29.9 % — ABNORMAL LOW (ref 36.0–46.0)
Hemoglobin: 10.2 g/dL — ABNORMAL LOW (ref 12.0–15.0)
MCH: 30.7 pg (ref 26.0–34.0)
MCHC: 34.1 g/dL (ref 30.0–36.0)
MCV: 90.1 fL (ref 80.0–100.0)
Platelets: 377 10*3/uL (ref 150–400)
RBC: 3.32 MIL/uL — ABNORMAL LOW (ref 3.87–5.11)
RDW: 12.8 % (ref 11.5–15.5)
WBC: 8.6 10*3/uL (ref 4.0–10.5)
nRBC: 0 % (ref 0.0–0.2)

## 2019-03-27 LAB — COMPREHENSIVE METABOLIC PANEL
ALT: 13 U/L (ref 0–44)
AST: 13 U/L — ABNORMAL LOW (ref 15–41)
Albumin: 3.7 g/dL (ref 3.5–5.0)
Alkaline Phosphatase: 39 U/L (ref 38–126)
Anion gap: 12 (ref 5–15)
BUN: 21 mg/dL (ref 8–23)
CO2: 26 mmol/L (ref 22–32)
Calcium: 8.5 mg/dL — ABNORMAL LOW (ref 8.9–10.3)
Chloride: 89 mmol/L — ABNORMAL LOW (ref 98–111)
Creatinine, Ser: 0.67 mg/dL (ref 0.44–1.00)
GFR calc Af Amer: >60 mL/min (ref >60)
GFR calc non Af Amer: >60 mL/min (ref >60)
Glucose, Bld: 133 mg/dL — ABNORMAL HIGH (ref 70–99)
Potassium: 2.9 mmol/L — ABNORMAL LOW (ref 3.5–5.1)
Sodium: 127 mmol/L — ABNORMAL LOW (ref 135–145)
Total Bilirubin: 0.4 mg/dL (ref 0.3–1.2)
Total Protein: 6.2 g/dL — ABNORMAL LOW (ref 6.5–8.1)

## 2019-03-27 LAB — TROPONIN I (HIGH SENSITIVITY): Troponin I (High Sensitivity): 9 ng/L (ref <18)

## 2019-03-27 LAB — SARS CORONAVIRUS 2 BY RT PCR (HOSPITAL ORDER, PERFORMED IN ~~LOC~~ HOSPITAL LAB): SARS Coronavirus 2: NEGATIVE

## 2019-03-27 LAB — ECHOCARDIOGRAM COMPLETE
Height: 64 in
Weight: 2229.29 oz

## 2019-03-27 LAB — GLUCOSE, CAPILLARY
Glucose-Capillary: 135 mg/dL — ABNORMAL HIGH (ref 70–99)
Glucose-Capillary: 120 mg/dL — ABNORMAL HIGH (ref 70–99)
Glucose-Capillary: 207 mg/dL — ABNORMAL HIGH (ref 70–99)

## 2019-03-27 LAB — MAGNESIUM: Magnesium: 1.4 mg/dL — ABNORMAL LOW (ref 1.7–2.4)

## 2019-03-27 LAB — MRSA PCR SCREENING: MRSA by PCR: NEGATIVE

## 2019-03-27 LAB — CBG MONITORING, ED: Glucose-Capillary: 122 mg/dL — ABNORMAL HIGH (ref 70–99)

## 2019-03-27 MED ORDER — CHLORTHALIDONE 25 MG PO TABS
12.5000 mg | ORAL_TABLET | Freq: Every day | ORAL | Status: DC
  Filled 2019-03-27 (×3): qty 0.5

## 2019-03-27 MED ORDER — ALPRAZOLAM 0.25 MG PO TABS: 0.2500 mg | ORAL_TABLET | Freq: Two times a day (BID) | ORAL | Status: DC | PRN

## 2019-03-27 MED ORDER — METHOCARBAMOL 500 MG PO TABS: 500.0000 mg | ORAL_TABLET | Freq: Three times a day (TID) | ORAL | 0 refills | Status: DC | PRN

## 2019-03-27 MED ORDER — CHLORHEXIDINE GLUCONATE CLOTH 2 % EX PADS
6.0000 | MEDICATED_PAD | Freq: Every day | CUTANEOUS | Status: DC
  Administered 2019-03-27: 6 via TOPICAL

## 2019-03-27 MED ORDER — MAGNESIUM SULFATE 2 GM/50ML IV SOLN
2.0000 g | Freq: Once | INTRAVENOUS | Status: AC
  Administered 2019-03-27: 2 g via INTRAVENOUS
  Filled 2019-03-27: qty 50

## 2019-03-27 MED ORDER — ASPIRIN EC 81 MG PO TBEC
81.0000 mg | DELAYED_RELEASE_TABLET | Freq: Every day | ORAL | Status: DC
  Administered 2019-03-27: 81 mg via ORAL
  Filled 2019-03-27: qty 1

## 2019-03-27 MED ORDER — NITROGLYCERIN IN D5W 200-5 MCG/ML-% IV SOLN
INTRAVENOUS | Status: AC
  Filled 2019-03-27: qty 250

## 2019-03-27 MED ORDER — CARVEDILOL 12.5 MG PO TABS: 12.5000 mg | ORAL_TABLET | Freq: Two times a day (BID) | ORAL | Status: DC

## 2019-03-27 MED ORDER — ALPRAZOLAM 0.25 MG PO TABS: 0.2500 mg | ORAL_TABLET | Freq: Two times a day (BID) | ORAL | 0 refills | Status: DC | PRN

## 2019-03-27 MED ORDER — MORPHINE SULFATE (PF) 2 MG/ML IV SOLN: 1.0000 mg | Freq: Four times a day (QID) | INTRAVENOUS | Status: DC | PRN

## 2019-03-27 MED ORDER — ASPIRIN EC 81 MG PO TBEC: 81.0000 mg | DELAYED_RELEASE_TABLET | Freq: Every day | ORAL | 1 refills | Status: DC

## 2019-03-27 MED ORDER — APIXABAN 5 MG PO TABS
5.0000 mg | ORAL_TABLET | Freq: Two times a day (BID) | ORAL | Status: DC
  Administered 2019-03-27: 5 mg via ORAL
  Filled 2019-03-27: qty 1

## 2019-03-27 MED ORDER — PANTOPRAZOLE SODIUM 40 MG PO TBEC
40.0000 mg | DELAYED_RELEASE_TABLET | Freq: Every day | ORAL | Status: DC
  Administered 2019-03-27: 40 mg via ORAL
  Filled 2019-03-27: qty 1

## 2019-03-27 MED ORDER — CHLORTHALIDONE 50 MG PO TABS
50.0000 mg | ORAL_TABLET | Freq: Every morning | ORAL | Status: DC
  Administered 2019-03-27: 10:00:00 50 mg via ORAL
  Filled 2019-03-27 (×3): qty 1

## 2019-03-27 MED ORDER — LOSARTAN POTASSIUM 50 MG PO TABS
75.0000 mg | ORAL_TABLET | Freq: Every day | ORAL | Status: DC
  Administered 2019-03-27: 75 mg via ORAL
  Filled 2019-03-27: qty 1

## 2019-03-27 MED ORDER — PRAVASTATIN SODIUM 40 MG PO TABS: 40.0000 mg | ORAL_TABLET | Freq: Every day | ORAL | Status: DC

## 2019-03-27 MED ORDER — ISOSORBIDE MONONITRATE ER 30 MG PO TB24
30.0000 mg | ORAL_TABLET | Freq: Every day | ORAL | Status: DC
  Administered 2019-03-27: 30 mg via ORAL
  Filled 2019-03-27: qty 1

## 2019-03-27 MED ORDER — TRAMADOL HCL 50 MG PO TABS: 50.0000 mg | ORAL_TABLET | Freq: Three times a day (TID) | ORAL | Status: DC | PRN

## 2019-03-27 MED ORDER — AMIODARONE HCL 200 MG PO TABS
200.0000 mg | ORAL_TABLET | Freq: Every day | ORAL | Status: DC
  Administered 2019-03-27: 200 mg via ORAL
  Filled 2019-03-27: qty 1

## 2019-03-27 MED ORDER — INSULIN ASPART 100 UNIT/ML ~~LOC~~ SOLN
0.0000 [IU] | SUBCUTANEOUS | Status: DC
  Administered 2019-03-27: 1 [IU] via SUBCUTANEOUS
  Administered 2019-03-27: 3 [IU] via SUBCUTANEOUS
  Administered 2019-03-27: 1 [IU] via SUBCUTANEOUS

## 2019-03-27 MED ORDER — CHLORTHALIDONE 25 MG PO TABS: 12.5000 mg | ORAL_TABLET | Freq: Every day | ORAL | 2 refills | Status: DC

## 2019-03-27 MED ORDER — ONDANSETRON HCL 4 MG/2ML IJ SOLN: 4.0000 mg | Freq: Four times a day (QID) | INTRAMUSCULAR | Status: DC | PRN

## 2019-03-27 MED ORDER — ISOSORBIDE MONONITRATE ER 30 MG PO TB24: 30.0000 mg | ORAL_TABLET | Freq: Every day | ORAL | 2 refills | Status: DC

## 2019-03-27 MED ORDER — POTASSIUM CHLORIDE CRYS ER 20 MEQ PO TBCR
20.0000 meq | EXTENDED_RELEASE_TABLET | Freq: Once | ORAL | Status: AC
  Administered 2019-03-27: 20 meq via ORAL
  Filled 2019-03-27: qty 1

## 2019-03-27 MED ORDER — POTASSIUM CHLORIDE CRYS ER 20 MEQ PO TBCR
40.0000 meq | EXTENDED_RELEASE_TABLET | ORAL | Status: AC
  Administered 2019-03-27 (×2): 40 meq via ORAL
  Filled 2019-03-27 (×2): qty 2

## 2019-03-27 MED ORDER — NITROGLYCERIN IN D5W 200-5 MCG/ML-% IV SOLN
0.0000 ug/min | INTRAVENOUS | Status: DC
  Administered 2019-03-27: 01:00:00 5 ug/min via INTRAVENOUS

## 2019-03-27 MED ORDER — METHOCARBAMOL 500 MG PO TABS
500.0000 mg | ORAL_TABLET | Freq: Once | ORAL | Status: AC
  Administered 2019-03-27: 500 mg via ORAL
  Filled 2019-03-27: qty 1

## 2019-03-27 NOTE — Discharge Summary (Addendum)
Amber Herman, is a 73 y.o. female  DOB Oct 14, 1945  MRN [id number].  Admission date:  03/26/2019  Admitting Physician  Jani Gravel, MD  Discharge Date:  03/27/2019   Primary MD  Renee Rival, NP  Recommendations for primary care physician for things to follow:  - 1)You are taking Eliquis/Apixaban which is your blood thinner so please avoid Avoid ibuprofen/Advil/Aleve/Motrin/Goody Powders/Naproxen/BC powders/Meloxicam/Diclofenac/Indomethacin and other Nonsteroidal anti-inflammatory medications as these will make you more likely to bleed and can cause stomach ulcers, can also cause Kidney problems.   2)Please note that there has been changes to your medications-  3)Please follow up with your Cardiologist Dr. Domenic Polite in [place] as previously scheduled, sooner if you develop further concerns  Admission Diagnosis  Unstable angina ([place]) [I20.0]   Discharge Diagnosis  Unstable angina ([place]) [I20.0]    Principal Problem:   Chest pain Active Problems:   Hyponatremia   Diabetes mellitus, type II (HCC)   AF (paroxysmal atrial fibrillation) ([place])      Past Medical History:  Diagnosis Date  . Allergic rhinitis   . Anemia   . Atrial fibrillation ([place])    Diagnosed 2013  . DM (diabetes mellitus), type 2 ([place])   . GERD (gastroesophageal reflux disease)   . Hyperlipidemia   . Hypertension   . Nephrolithiasis   . Transient ischemic attack (TIA)   . Vitamin D deficiency     Past Surgical History:  Procedure Laterality Date  . CARDIOVERSION N/A 02/22/2019   Procedure: CARDIOVERSION;  Surgeon: Donato Heinz, MD;  Location: [facility] ENDOSCOPY;  Service: Endoscopy;  Laterality: N/A;  . CHOLECYSTECTOMY    . LEFT HEART CATH AND CORONARY ANGIOGRAPHY N/A 02/21/2019   Procedure: LEFT HEART CATH AND CORONARY ANGIOGRAPHY;  Surgeon: [name], MD;  Location: [facility] CV LAB;  Service:  Cardiovascular;  Laterality: N/A;  . TEE WITHOUT CARDIOVERSION N/A 02/22/2019   Procedure: TRANSESOPHAGEAL ECHOCARDIOGRAM (TEE);  Surgeon: Donato Heinz, MD;  Location: [facility] ENDOSCOPY;  Service: Endoscopy;  Laterality: N/A;       HPI  from the history and physical done on the day of admission:    -  Tytionna Herman  is a 73 y.o. female,  w hypertension, hyperlipidemia, Dm2, TIA, Pafib, CAD w NSTEMI , s/p cardiac cath 02/21/2019-> mid- distal 50% LAD, presents with c/o chest pain for the past 3-4 days. Intermittent, left sided, "achy" .  Pt states that the pain began about 9am yesterday morning at rest.  Pt was concerned due to pain not resolving and therefore presented to ED.  slg nitro with partial relief.  1 episode of n/v  Pt denies fever, chills, cough, palp, abd pain, diarrhea, brbpr, black stool. Nothing appeared to make the pain in particular better or worse.    In ED,  T 97.5  p 62  R 18 Bp 203/60  Pox 99% on RA Wt 65.3 kg  CXR IMPRESSION: Cardiomegaly without acute abnormality of the lungs.  Na 126, K 3.3, Bun 20, Creatinine 1.05  Glucose 133  Wbc 9.8, Hgb 11.3, Plt 423  Trop 5-> 8  EKG nsr at 60, nl axis,  Nl int, no st-t changes c/w ischemia  Pt will be admitted for chest pain .     Hospital Course:      -1) atypical persistent chest pains--discussed with Dr. Bronson Ing, patient had LHC on 02/21/2019 and TEE with cardioversion on 02/22/2019 -No interventions planned, medical management advised -She is already on Eliquis, pravastatin, aspirin on Coreg -Add isosorbide, -Medical management advised, follow-up as outpatient with Dr. Domenic Polite in [facility] of care reviewed with patient and daughter -Chest pain likely musculoskeletal, methocarbamol as prescribed --Echo with EF of 65 to [id number] with diastolic dysfunction  2) anxiety disorder--- ECP recently prescribed Lexapro, patient does not want to take this, may use Xanax very sparingly as needed  3)  chronic A. Fib--status post TEE and cardioversion on 02/22/2019, continue amiodarone and Coreg to maintain reading, continue Eliquis for stroke prophylaxis  4) electrolyte abnormalities--patient with hyponatremia, hypokalemia and hypomagnesemia--- due to high-dose chlorthalidone -PTA patient was on chlorthalidone 50 mg daily, this has been reduced to 12.5 mg daily  5)DM2-okay to resume home regimen including metformin and insulin  Discharge Condition: stable  Follow UP-Dr. Domenic Polite in ED  Diet and Activity recommendation:  As advised  Discharge Instructions    Discharge Instructions    Call MD for:  difficulty breathing, headache or visual disturbances   Complete by: As directed    Call MD for:  persistant dizziness or light-headedness   Complete by: As directed    Call MD for:  persistant nausea and vomiting   Complete by: As directed    Call MD for:  severe uncontrolled pain   Complete by: As directed    Call MD for:  temperature >100.4   Complete by: As directed    Diet - low sodium heart healthy   Complete by: As directed    Discharge instructions   Complete by: As directed    1)You are taking Eliquis/Apixaban which is your blood thinner so please avoid Avoid ibuprofen/Advil/Aleve/Motrin/Goody Powders/Naproxen/BC powders/Meloxicam/Diclofenac/Indomethacin and other Nonsteroidal anti-inflammatory medications as these will make you more likely to bleed and can cause stomach ulcers, can also cause Kidney problems.   2)Please note that there has been changes to your medications-  3)Please follow up with your Cardiologist Dr. Domenic Polite in [place] as previously scheduled, sooner if you develop further concerns   Increase activity slowly   Complete by: As directed         Discharge Medications     Allergies as of 03/27/2019      Reactions   Amlodipine    Angiotensin Receptor Blockers    Taking losartan at admission   Calcium Channel Blockers    Latex Hives, Itching    Nicardipine Hcl    Simvastatin    Sulfa Antibiotics Itching, Rash   Sulfacetamide Sodium Itching, Rash   Sulfasalazine Itching, Rash      Medication List    TAKE these medications   acetaminophen 325 MG tablet Commonly known as: Tylenol Take 2 tablets (650 mg total) by mouth every 8 (eight) hours as needed.   ALPRAZolam 0.25 MG tablet Commonly known as: XANAX Take 1 tablet (0.25 mg total) by mouth 2 (two) times daily as needed for anxiety or sleep.   amiodarone 200 MG tablet Commonly known as: PACERONE Take 1 tablet (200 mg total) by mouth daily. Notes to patient: Next dose due tomorrow 03/28/2019 in the morning  apixaban 5 MG Tabs tablet Commonly known as: ELIQUIS Take 1 tablet (5 mg total) by mouth 2 (two) times daily. Notes to patient: Next dose due today 03/27/2019 at bedtime (9pm)   aspirin EC 81 MG tablet Take 1 tablet (81 mg total) by mouth daily with breakfast. What changed: when to take this Notes to patient: Next dose due tomorrow 03/28/2019 in the morning   carvedilol 12.5 MG tablet Commonly known as: COREG Take 1 tablet (12.5 mg total) by mouth 2 (two) times daily with a meal. Notes to patient: Next dose due tomorrow 03/28/2019 in the morning   chlorthalidone 25 MG tablet Commonly known as: HYGROTON Take 0.5 tablets (12.5 mg total) by mouth daily. Start taking on: March 28, 2019 What changed:   medication strength  how much to take  when to take this Notes to patient: Next dose due tomorrow 03/28/2019 in the morning   FeroSul 325 (65 FE) MG tablet Generic drug: ferrous sulfate Take 325 mg by mouth daily. Notes to patient: Next dose due tomorrow 03/28/2019 in the morning   isosorbide mononitrate 30 MG 24 hr tablet Commonly known as: IMDUR Take 1 tablet (30 mg total) by mouth daily. Start taking on: March 28, 2019 Notes to patient: Next dose due tomorrow 03/28/2019 in the morning   Klor-Con M20 20 MEQ tablet Generic drug: potassium  chloride SA Take 1 tablet by mouth daily. Notes to patient: Next dose due tomorrow 03/28/2019 in the morning   losartan 50 MG tablet Commonly known as: COZAAR Take 1.5 tablets (75 mg total) by mouth daily. Notes to patient: Next dose due tomorrow 03/28/2019 in the morning   metFORMIN 850 MG tablet Commonly known as: GLUCOPHAGE Take 850 mg by mouth 2 (two) times daily with a meal. Notes to patient: Next dose due today 03/27/2019 at dinnertime   methocarbamol 500 MG tablet Commonly known as: ROBAXIN Take 1 tablet (500 mg total) by mouth every 8 (eight) hours as needed for muscle spasms (as needed for pain). Notes to patient: May take next dose today 03/27/2019 after 7pm   nitroGLYCERIN 0.4 MG SL tablet Commonly known as: NITROSTAT Place 1 tablet (0.4 mg total) under the tongue every 5 (five) minutes as needed for chest pain (CP or SOB).   pantoprazole 40 MG tablet Commonly known as: PROTONIX Take 40 mg by mouth daily. Notes to patient: Next dose due tomorrow 03/28/2019 in the morning   pravastatin 40 MG tablet Commonly known as: PRAVACHOL Take 40 mg by mouth at bedtime. Notes to patient: Next dose due today 03/27/2019 at bedtime (9pm)   traMADol 50 MG tablet Commonly known as: ULTRAM Take 50 mg by mouth every 8 (eight) hours as needed for moderate pain.   Tyler Aas FlexTouch 200 UNIT/ML Sopn Generic drug: Insulin Degludec Inject 12 Units into the skin at bedtime. Notes to patient: Next dose due today 03/27/2019 at bedtime (9pm)   triamcinolone cream 0.1 % Commonly known as: KENALOG Apply 1 application topically 2 (two) times daily as needed. 1 application to affected area twice daily, Mix 1:1 with Eucerin externally   vitamin B-12 1000 MCG tablet Commonly known as: CYANOCOBALAMIN Take 1,000 mcg by mouth daily. Notes to patient: Next dose due tomorrow 03/28/2019 in the morning   VITAMIN D PO Take 1 capsule by mouth daily. Notes to patient: Next dose due tomorrow  03/28/2019 in the morning       Major procedures and Radiology Reports - PLEASE review detailed and final reports for  all details, in brief -   Dg Chest Port 1 View  Result Date: 03/26/2019 CLINICAL DATA:  Left-sided chest pain EXAM: PORTABLE CHEST 1 VIEW COMPARISON:  02/19/2019 FINDINGS: Cardiomegaly. Both lungs are clear. The visualized skeletal structures are unremarkable. IMPRESSION: Cardiomegaly without acute abnormality of the lungs. Electronically Signed   By: Eddie Candle M.D.   On: 03/26/2019 21:08    Micro Results   Recent Results (from the past 240 hour(s))  SARS Coronavirus 2 by RT PCR (hospital order, performed in [facility] hospital lab) Nasopharyngeal Nasopharyngeal Swab     Status: None   Collection Time: 03/27/19 12:04 AM   Specimen: Nasopharyngeal Swab  Result Value Ref Range Status   SARS Coronavirus 2 NEGATIVE NEGATIVE Final    Comment: (NOTE) If result is NEGATIVE SARS-CoV-2 target nucleic acids are NOT DETECTED. The SARS-CoV-2 RNA is generally detectable in upper and lower  respiratory specimens during the acute phase of infection. The lowest  concentration of SARS-CoV-2 viral copies this assay can detect is 250  copies / mL. A negative result does not preclude SARS-CoV-2 infection  and should not be used as the sole basis for treatment or other  patient management decisions.  A negative result may occur with  improper specimen collection / handling, submission of specimen other  than nasopharyngeal swab, presence of viral mutation(s) within the  areas targeted by this assay, and inadequate number of viral copies  (<250 copies / mL). A negative result must be combined with clinical  observations, patient history, and epidemiological information. If result is POSITIVE SARS-CoV-2 target nucleic acids are DETECTED. The SARS-CoV-2 RNA is generally detectable in upper and lower  respiratory specimens dur ing the acute phase of infection.  Positive  results  are indicative of active infection with SARS-CoV-2.  Clinical  correlation with patient history and other diagnostic information is  necessary to determine patient infection status.  Positive results do  not rule out bacterial infection or co-infection with other viruses. If result is PRESUMPTIVE POSTIVE SARS-CoV-2 nucleic acids MAY BE PRESENT.   A presumptive positive result was obtained on the submitted specimen  and confirmed on repeat testing.  While 2019 novel coronavirus  (SARS-CoV-2) nucleic acids may be present in the submitted sample  additional confirmatory testing may be necessary for epidemiological  and / or clinical management purposes  to differentiate between  SARS-CoV-2 and other Sarbecovirus currently known to infect humans.  If clinically indicated additional testing with an alternate test  methodology [phone]) is advised. The SARS-CoV-2 RNA is generally  detectable in upper and lower respiratory sp ecimens during the acute  phase of infection. The expected result is Negative. Fact Sheet for Patients:  StrictlyIdeas.no Fact Sheet for Healthcare Providers: BankingDealers.co.za This test is not yet approved or cleared by the [place] FDA and has been authorized for detection and/or diagnosis of SARS-CoV-2 by FDA under an Emergency Use Authorization (EUA).  This EUA will remain in effect (meaning this test can be used) for the duration of the COVID-19 declaration under Section 564(b)(1) of the Act, 21 U.S.C. section 360bbb-3(b)(1), unless the authorization is terminated or revoked sooner. Performed at [facility], [address], [place], [place] [address]   MRSA PCR Screening     Status: None   Collection Time: 03/27/19  2:08 AM   Specimen: Nasal Mucosa; Nasopharyngeal  Result Value Ref Range Status   MRSA by PCR NEGATIVE NEGATIVE Final    Comment:  The GeneXpert MRSA Assay (FDA approved for NASAL  specimens only), is one component of a comprehensive MRSA colonization surveillance program. It is not intended to diagnose MRSA infection nor to guide or monitor treatment for MRSA infections. Performed at [facility], [address], [place], [place] [address]        Today   [place] today has no new complaints -Chest pain is better -No shortness of breath, no pleuritic symptoms, -Daughter at bedside, questions answered          Patient has been seen and examined prior to discharge   Objective   Blood pressure (!) 133/56, pulse (!) 56, temperature (!) 97.5 F (36.4 C), temperature source Oral, resp. rate 18, height 5\' 4"  (1.626 m), weight 63.2 kg, SpO2 99 %.   Intake/Output Summary (Last 24 hours) at 03/27/2019 1741 Last data filed at 03/27/2019 1600 Gross per 24 hour  Intake 567.29 ml  Output 1200 ml  Net -632.71 ml   Exam Gen:- Awake Alert, no acute distress  HEENT:- [place].AT, No sclera icterus Neck-Supple Neck,No JVD,.  Lungs-  CTAB , good air movement bilaterally  CV- S1, S2 normal, regular Abd-  +ve B.Sounds, Abd Soft, No tenderness,    Extremity/Skin:- No  edema,   good pulses Psych-affect is appropriate, oriented x3 Neuro-no new focal deficits, no tremors    Data Review   CBC w Diff:  Lab Results  Component Value Date   WBC 8.6 03/27/2019   HGB 10.2 (L) 03/27/2019   HCT 29.9 (L) 03/27/2019   PLT 377 03/27/2019   LYMPHOPCT 10 02/19/2019   MONOPCT 5 02/19/2019   EOSPCT 0 02/19/2019   BASOPCT 0 02/19/2019   CMP:  Lab Results  Component Value Date   NA 127 (L) 03/27/2019   K 2.9 (L) 03/27/2019   CL 89 (L) 03/27/2019   CO2 26 03/27/2019   BUN 21 03/27/2019   CREATININE 0.67 03/27/2019   PROT 6.2 (L) 03/27/2019   ALBUMIN 3.7 03/27/2019   BILITOT 0.4 03/27/2019   ALKPHOS 39 03/27/2019   AST 13 (L) 03/27/2019   ALT 13 03/27/2019   Total Discharge time is about 33 minutes  Roxan Hockey M.D on 03/27/2019 at 5:41 PM   Go to [email or web address] -  for contact info  [facility] - Office  [phone]

## 2019-03-27 NOTE — Progress Notes (Signed)
ANTICOAGULATION CONSULT NOTE - Preliminary  Pharmacy Consult for eliquis Indication: VTE prophylaxis  Allergies  Allergen Reactions  . Amlodipine   . Angiotensin Receptor Blockers     Taking losartan at admission  . Calcium Channel Blockers   . Latex Hives and Itching  . Nicardipine Hcl   . Simvastatin   . Sulfa Antibiotics Itching and Rash  . Sulfacetamide Sodium Itching and Rash  . Sulfasalazine Itching and Rash    Patient Measurements: Height: 5\' 4"  (162.6 cm) Weight: 139 lb 5.3 oz (63.2 kg) IBW/kg (Calculated) : 54.7 HEPARIN DW (KG): 63.2   Vital Signs: Temp: 97.5 F (36.4 C) (10/25 2020) Temp Source: Oral (10/25 2020) BP: 127/60 (10/26 0300) Pulse Rate: 54 (10/26 0300)  Labs: Recent Labs    03/26/19 2030  HGB 11.3*  HCT 34.0*  PLT 423*  CREATININE 1.05*   Estimated Creatinine Clearance: 41.2 mL/min (A) (by C-G formula based on SCr of 1.05 mg/dL (H)).  Medical History: Past Medical History:  Diagnosis Date  . Allergic rhinitis   . Anemia   . Atrial fibrillation ([place])    Diagnosed 2013  . DM (diabetes mellitus), type 2 ([place])   . GERD (gastroesophageal reflux disease)   . Hyperlipidemia   . Hypertension   . Nephrolithiasis   . Transient ischemic attack (TIA)   . Vitamin D deficiency     Medications:  Scheduled:  . amiodarone  200 mg Oral Daily  . apixaban  5 mg Oral BID  . aspirin EC  81 mg Oral Daily  . carvedilol  12.5 mg Oral BID WC  . Chlorhexidine Gluconate Cloth  6 each Topical Daily  . chlorthalidone  50 mg Oral q morning - 10a  . insulin aspart  0-9 Units Subcutaneous Q4H  . losartan  75 mg Oral Daily  . pantoprazole  40 mg Oral Daily  . pravastatin  40 mg Oral QHS  . sodium chloride flush  3 mL Intravenous Q12H    Assessment: 73 yo female admitted with chest pain.  Has been on eliquis at home for atrial fibrillation.  Will continue home dose of 5mg  BID.    Plan:  Apixiban 5mg  PO BID Preliminary review of pertinent patient  information completed.  Amber Herman clinical pharmacist will complete review during morning rounds to assess the patient and finalize treatment regimen.  Amber Herman, RPH 03/27/2019,3:50 AM

## 2019-03-27 NOTE — Discharge Instructions (Signed)
1)You are taking Eliquis/Apixaban which is your blood thinner so please avoid Avoid ibuprofen/Advil/Aleve/Motrin/Goody Powders/Naproxen/BC powders/Meloxicam/Diclofenac/Indomethacin and other Nonsteroidal anti-inflammatory medications as these will make you more likely to bleed and can cause stomach ulcers, can also cause Kidney problems.   2)Please note that there has been changes to your medications-  3)Please follow up with your Cardiologist Dr. Domenic Polite in [place] as previously scheduled, sooner if you develop further concerns

## 2019-03-27 NOTE — Progress Notes (Signed)
  Echocardiogram 2D Echocardiogram has been performed.  Darlina Sicilian M 03/27/2019, 2:43 PM

## 2019-03-27 NOTE — Progress Notes (Addendum)
Patient alert and oriented x4. Patient currently has no complaints of pain, shortness of breath, dizziness, nausea or vomiting. Patient stated that the one time dose of Robaxin helped with the chest pain that was rated a 3 out of 10 before being given. Patient denied any pain radiating to extremities or jaw. Dr Joesph Fillers aware. Patient up out of bed to chair and commode with stand by assist. Gait steady. Patient tolerated PO meds and diet well, appetite good. Discharge summary, follow up appointments and medication education gone over with patient and patient daughter (Tammie). Both expressed full understanding of summary, information and education with teach back. Patient stated she has an appointment with Angelina Ok NP tomorrow (03/28/2019) at 4pm. IV removed without complications. Patient discharged with all belongings for home via car (Tammie driving patient home).

## 2019-04-19 ENCOUNTER — Other Ambulatory Visit: Payer: Self-pay

## 2019-04-19 ENCOUNTER — Ambulatory Visit (INDEPENDENT_AMBULATORY_CARE_PROVIDER_SITE_OTHER): Payer: Medicare Other | Admitting: Cardiology

## 2019-04-19 ENCOUNTER — Encounter: Payer: Self-pay | Admitting: Cardiology

## 2019-04-19 VITALS — BP 215/76 | HR 67 | Ht 64.0 in | Wt 144.2 lb

## 2019-04-19 DIAGNOSIS — I10 Essential (primary) hypertension: Secondary | ICD-10-CM | POA: Diagnosis not present

## 2019-04-19 DIAGNOSIS — E782 Mixed hyperlipidemia: Secondary | ICD-10-CM

## 2019-04-19 DIAGNOSIS — I200 Unstable angina: Secondary | ICD-10-CM

## 2019-04-19 DIAGNOSIS — I25119 Atherosclerotic heart disease of native coronary artery with unspecified angina pectoris: Secondary | ICD-10-CM

## 2019-04-19 DIAGNOSIS — I4819 Other persistent atrial fibrillation: Secondary | ICD-10-CM

## 2019-04-19 MED ORDER — ISOSORBIDE MONONITRATE ER 30 MG PO TB24: 30.0000 mg | ORAL_TABLET | Freq: Every evening | ORAL | 2 refills | Status: DC

## 2019-04-19 MED ORDER — CHLORTHALIDONE 25 MG PO TABS: 25.0000 mg | ORAL_TABLET | ORAL | 2 refills | Status: DC

## 2019-04-19 MED ORDER — MAGNESIUM 400 MG PO TABS: 1.0000 | ORAL_TABLET | Freq: Every day | ORAL | 2 refills | Status: DC

## 2019-04-19 MED ORDER — LOSARTAN POTASSIUM 50 MG PO TABS: 50.0000 mg | ORAL_TABLET | Freq: Two times a day (BID) | ORAL | 3 refills | Status: AC

## 2019-04-19 NOTE — Patient Instructions (Addendum)
Medication Instructions:   Your physician has recommended you make the following change in your medication:   Change losartan to 50 mg by mouth twice daily (12 hours apart)  Increase chlorthalidone to 25 mg in the morning  Take your isosorbide mononitrate (imdur) 30 mg in the evening  Start magnesium 400 mg by mouth daily  Continue other medications the same  Labwork:  Your physician recommends that you return for non-fasting lab work in: one week to check your BMET and Magnesium level. (04/27/2019). This can be done at [facility] or [facility] in [place].  Testing/Procedures:  NONE  Follow-Up:  Your physician recommends that you schedule a follow-up appointment in: 4-6 weeks.  Any Other Special Instructions Will Be Listed Below (If Applicable).  If you need a refill on your cardiac medications before your next appointment, please call your pharmacy.

## 2019-04-19 NOTE — Progress Notes (Signed)
Cardiology Office Note  Date: 04/19/2019   ID: Vincent, Kung [birth year], MRN [id number]  PCP:  Renee Rival, NP  Cardiologist:  Rozann Lesches, MD Electrophysiologist:  None   Chief Complaint  Patient presents with  . Cardiac follow-up    History of Present Illness: Amber Herman is a 73 y.o. female last seen in October.  I reviewed interval records.  She was hospitalized at [facility] in late October with chest pain.  High-sensitivity troponin I levels were negative for ACS. Case discussed with Dr. Bronson Ing who recommended medical therapy in light of recent cardiac work-up, described below.  She was placed on long-acting nitrate.  She is here with her daughter today.  Still seems to be struggling with keeping her blood pressure controlled.  She is reportedly compliant with her medications, reviewed her list in detail today.  She feels anxious, has had trouble with insomnia.  She wonders whether this could be contributing to her blood pressure control.  She does have a number of antihypertensive allergies as outlined below. Fortunately, she has maintained sinus rhythm, does not report any palpitations or recent chest pain.  I went over her medications in detail and we discussed continuing Coreg at current dose, changing losartan to 50 mg twice daily, increasing chlorthalidone to 25 mg in the morning with potassium supplements and also adding a magnesium supplement, and making sure that she takes her Imdur in the evening.  She also needs follow-up lab work.  Past Medical History:  Diagnosis Date  . Allergic rhinitis   . Anemia   . Atrial fibrillation ([place])    Diagnosed 2013  . DM (diabetes mellitus), type 2 ([place])   . GERD (gastroesophageal reflux disease)   . Hyperlipidemia   . Hypertension   . Nephrolithiasis   . Transient ischemic attack (TIA)   . Vitamin D deficiency     Past Surgical History:  Procedure Laterality Date  . CARDIOVERSION N/A 02/22/2019    Procedure: CARDIOVERSION;  Surgeon: Donato Heinz, MD;  Location: [facility] ENDOSCOPY;  Service: Endoscopy;  Laterality: N/A;  . CHOLECYSTECTOMY    . LEFT HEART CATH AND CORONARY ANGIOGRAPHY N/A 02/21/2019   Procedure: LEFT HEART CATH AND CORONARY ANGIOGRAPHY;  Surgeon: [name], MD;  Location: [place] CV LAB;  Service: Cardiovascular;  Laterality: N/A;  . TEE WITHOUT CARDIOVERSION N/A 02/22/2019   Procedure: TRANSESOPHAGEAL ECHOCARDIOGRAM (TEE);  Surgeon: Donato Heinz, MD;  Location: [facility] ENDOSCOPY;  Service: Endoscopy;  Laterality: N/A;    Current Outpatient Medications  Medication Sig Dispense Refill  . acetaminophen (TYLENOL) 325 MG tablet Take 2 tablets (650 mg total) by mouth every 8 (eight) hours as needed. 60 tablet 0  . ALPRAZolam (XANAX) 0.25 MG tablet Take 1 tablet (0.25 mg total) by mouth 2 (two) times daily as needed for anxiety or sleep. 12 tablet 0  . amiodarone (PACERONE) 200 MG tablet Take 1 tablet (200 mg total) by mouth daily. 30 tablet 2  . apixaban (ELIQUIS) 5 MG TABS tablet Take 1 tablet (5 mg total) by mouth 2 (two) times daily. 60 tablet 1  . aspirin EC 81 MG tablet Take 1 tablet (81 mg total) by mouth daily with breakfast. 30 tablet 1  . carvedilol (COREG) 12.5 MG tablet Take 1 tablet (12.5 mg total) by mouth 2 (two) times daily with a meal. 60 tablet 2  . chlorthalidone (HYGROTON) 25 MG tablet Take 1 tablet (25 mg total) by mouth every morning. 90 tablet  2  . FEROSUL 325 (65 Fe) MG tablet Take 325 mg by mouth daily.     . isosorbide mononitrate (IMDUR) 30 MG 24 hr tablet Take 1 tablet (30 mg total) by mouth every evening. 90 tablet 2  . losartan (COZAAR) 50 MG tablet Take 1 tablet (50 mg total) by mouth 2 (two) times daily. 180 tablet 3  . metFORMIN (GLUCOPHAGE) 850 MG tablet Take 850 mg by mouth 2 (two) times daily with a meal.     . methocarbamol (ROBAXIN) 500 MG tablet Take 1 tablet (500 mg total) by mouth every 8 (eight) hours as needed for  muscle spasms (as needed for pain). 15 tablet 0  . nitroGLYCERIN (NITROSTAT) 0.4 MG SL tablet Place 1 tablet (0.4 mg total) under the tongue every 5 (five) minutes as needed for chest pain (CP or SOB). 9 tablet 2  . pantoprazole (PROTONIX) 40 MG tablet Take 40 mg by mouth daily.    . potassium chloride SA (KLOR-CON M20) 20 MEQ tablet Take 1 tablet by mouth daily.     . pravastatin (PRAVACHOL) 40 MG tablet Take 40 mg by mouth at bedtime.     . TRESIBA FLEXTOUCH 200 UNIT/ML SOPN Inject 12 Units into the skin at bedtime.     . triamcinolone cream (KENALOG) 0.1 % Apply 1 application topically 2 (two) times daily as needed. 1 application to affected area twice daily, Mix 1:1 with Eucerin externally    . vitamin B-12 (CYANOCOBALAMIN) 1000 MCG tablet Take 1,000 mcg by mouth daily.     Marland Kitchen VITAMIN D PO Take 1 capsule by mouth daily.    . Magnesium 400 MG TABS Take 1 tablet by mouth daily. 90 tablet 2   No current facility-administered medications for this visit.    Allergies:  Amlodipine, Angiotensin receptor blockers, Calcium channel blockers, Latex, Nicardipine hcl, Simvastatin, Sulfa antibiotics, Sulfacetamide sodium, and Sulfasalazine   Social History: The patient  reports that she has never smoked. She has never used smokeless tobacco. She reports that she does not drink alcohol or use drugs.   ROS:  Please see the history of present illness. Otherwise, complete review of systems is positive for none.  All other systems are reviewed and negative.   Physical Exam: VS:  BP (!) 215/76 (BP Location: Right Arm, Cuff Size: Normal)   Pulse 67   Ht 5\' 4"  (1.626 m)   Wt 144 lb 3.2 oz (65.4 kg)   SpO2 99%   BMI 24.75 kg/m , BMI Body mass index is 24.75 kg/m.  Wt Readings from Last 3 Encounters:  04/19/19 144 lb 3.2 oz (65.4 kg)  03/27/19 139 lb 5.3 oz (63.2 kg)  03/08/19 144 lb (65.3 kg)    General: Elderly woman, appears comfortable at rest. HEENT: Conjunctiva and lids normal, wearing a mask.  Neck: Supple, no elevated JVP or carotid bruits, no thyromegaly. Lungs: Clear to auscultation, nonlabored breathing at rest. Cardiac: Regular rate and rhythm, no S3, soft systolic murmur. Abdomen: Soft, nontender,  bowel sounds present. Extremities: No pitting edema, distal pulses 2+. Skin: Warm and dry. Musculoskeletal: No kyphosis. Neuropsychiatric: Alert and oriented x3, affect grossly appropriate.  ECG:  An ECG dated 03/26/2019 was personally reviewed today and demonstrated:  Sinus rhythm with probable lead artifact.  Recent Labwork: 02/23/2019: TSH 4.818 03/27/2019: ALT 13; AST 13; BUN 21; Creatinine, Ser 0.67; Hemoglobin 10.2; Magnesium 1.4; Platelets 377; Potassium 2.9; Sodium 127     Component Value Date/Time   CHOL 136 02/21/2019 0018  TRIG 145 02/21/2019 0018   HDL 40 (L) 02/21/2019 0018   CHOLHDL 3.4 02/21/2019 0018   VLDL 29 02/21/2019 0018   LDLCALC 67 02/21/2019 0018    Other Studies Reviewed Today:  Cardiac catheterization 02/21/2019: 1. 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 Widely patent left main, left circumflex, and RCA 3. Normal LV systolic function and normal LVEDP  Pt with low BP throughout the procedure but awake and stable. Treated with fluid bolus and discontinuation of IV NTG.  Will review with rounding team. Will resume eliquis tonight. Consider TEE/cardioversion tomorrow.   Echocardiogram 02/21/2019: 1. Left ventricular ejection fraction, by visual estimation, is 60 to 65%. The left ventricle has normal function. Normal left ventricular size. There is mildly increased left ventricular hypertrophy. 2. Left ventricular diastolic function could not be evaluated pattern of LV diastolic filling. 3. Global right ventricle has normal systolic function.The right ventricular size is normal. No increase in right ventricular wall thickness. 4. Left atrial size was normal. 5. Right atrial size  was normal. 6. Mild mitral annular calcification. 7. The mitral valve is normal in structure. No evidence of mitral valve regurgitation. No evidence of mitral stenosis. 8. The tricuspid valve is normal in structure. Tricuspid valve regurgitation was not visualized by color flow Doppler. 9. The aortic valve is normal in structure. Aortic valve regurgitation was not visualized by color flow Doppler. Mild aortic valve sclerosis without stenosis. 10. The pulmonic valve was grossly normal. Pulmonic valve regurgitation is not visualized by color flow Doppler. 11. Normal pulmonary artery systolic pressure. 12. The inferior vena cava is normal in size with greater than 50% respiratory variability, suggesting right atrial pressure of 3 mmHg.  TEE 02/22/2019: 1. Left ventricular ejection fraction, by visual estimation, is 60 to 65%. The left ventricle has normal function. Normal left ventricular size. There is no left ventricular hypertrophy. 2. Global right ventricle has normal systolic function.The right ventricular size is normal. No increase in right ventricular wall thickness. 3. Left atrial size was normal. 4. No left atrial appendage thrombus. 5. The mitral valve is normal in structure. Mild mitral valve regurgitation. 6. The tricuspid valve is normal in structure. Tricuspid valve regurgitation is mild. 7. The aortic valve is tricuspid Aortic valve regurgitation was not visualized by color flow Doppler. 8. The pulmonic valve was grossly normal. Pulmonic valve regurgitation is trivial by color flow Doppler.  Chest x-ray 03/26/2019: FINDINGS: Cardiomegaly. Both lungs are clear. The visualized skeletal structures are unremarkable.  IMPRESSION: Cardiomegaly without acute abnormality of the lungs.  Assessment and Plan:  1.  Uncontrolled hypertension with essential hypertension at baseline.  Continue Coreg at current dose, change losartan to 50 mg twice daily, increase chlorthalidone  to 25 mg in the morning, continue potassium supplements and add magnesium supplements, take Imdur 30 mg in the evenings.  Follow-up BMET in 1 week.  2.  CAD, moderate mid LAD stenosis by recent cardiac catheterization with plan for medical therapy.  Hospital observation October showed normal high-sensitivity troponin I levels.  Plan to stop aspirin at this point since she is concurrently on Eliquis, otherwise continue beta-blocker, ARB, and statin.  She is tolerating Imdur as well.  3.  Persistent atrial fibrillation with CHA2DS2-VASc score of 7.  She is maintaining sinus rhythm.  Continue Eliquis for stroke prophylaxis and also amiodarone at 200 mg daily.  LFTs and TSH normal.  4.  Mixed hyperlipidemia on Pravachol.  Medication Adjustments/Labs and Tests  Ordered: Current medicines are reviewed at length with the patient today.  Concerns regarding medicines are outlined above.   Tests Ordered: Orders Placed This Encounter  Procedures  . Basic metabolic panel    Medication Changes: Meds ordered this encounter  Medications  . losartan (COZAAR) 50 MG tablet    Sig: Take 1 tablet (50 mg total) by mouth 2 (two) times daily.    Dispense:  180 tablet    Refill:  3    04/19/2019 change in directions  . chlorthalidone (HYGROTON) 25 MG tablet    Sig: Take 1 tablet (25 mg total) by mouth every morning.    Dispense:  90 tablet    Refill:  2    04/19/2019 dose increase  . isosorbide mononitrate (IMDUR) 30 MG 24 hr tablet    Sig: Take 1 tablet (30 mg total) by mouth every evening.    Dispense:  90 tablet    Refill:  2    04/19/2019 change in time of day to take  . Magnesium 400 MG TABS    Sig: Take 1 tablet by mouth daily.    Dispense:  90 tablet    Refill:  2    04/19/2019 NEW    Disposition:  Follow up 4 to 6 weeks.  Signed, Satira Sark, MD, [facility] 04/19/2019 4:24 PM    [facility] [address], [place], [place] [address] Phone: [phone];  Fax: [phone]

## 2019-04-19 NOTE — Addendum Note (Signed)
Addended by: Merlene Laughter on: 04/19/2019 04:29 PM   Modules accepted: Orders

## 2019-04-28 ENCOUNTER — Other Ambulatory Visit (HOSPITAL_COMMUNITY)
Admission: RE | Admit: 2019-04-28 | Discharge: 2019-04-28 | Disposition: A | Discharge status: Home / Self Care | Payer: Medicare Other | Source: Ambulatory Visit | Attending: Cardiology | Admitting: Cardiology

## 2019-04-28 DIAGNOSIS — I10 Essential (primary) hypertension: Secondary | ICD-10-CM | POA: Insufficient documentation

## 2019-04-28 LAB — BASIC METABOLIC PANEL
Anion gap: 13 (ref 5–15)
BUN: 22 mg/dL (ref 8–23)
CO2: 25 mmol/L (ref 22–32)
Calcium: 9.1 mg/dL (ref 8.9–10.3)
Chloride: 96 mmol/L — ABNORMAL LOW (ref 98–111)
Creatinine, Ser: 0.83 mg/dL (ref 0.44–1.00)
GFR calc Af Amer: >60 mL/min (ref >60)
GFR calc non Af Amer: >60 mL/min (ref >60)
Glucose, Bld: 71 mg/dL (ref 70–99)
Potassium: 3.5 mmol/L (ref 3.5–5.1)
Sodium: 134 mmol/L — ABNORMAL LOW (ref 135–145)

## 2019-04-28 LAB — MAGNESIUM: Magnesium: 1.7 mg/dL (ref 1.7–2.4)

## 2019-05-01 ENCOUNTER — Telehealth: Payer: Self-pay | Admitting: *Deleted

## 2019-05-01 MED ORDER — HYDRALAZINE HCL 25 MG PO TABS: 25.0000 mg | ORAL_TABLET | Freq: Two times a day (BID) | ORAL | 3 refills | Status: DC

## 2019-05-01 NOTE — Telephone Encounter (Signed)
Pt voiced understanding - hydralazine sent to [facility]

## 2019-05-01 NOTE — Telephone Encounter (Signed)
-----   Message from Satira Sark, MD sent at 04/29/2019  4:07 PM EST ----- Results reviewed.  Both magnesium and potassium levels have improved compared to last check in October.  Continue with potassium and magnesium supplements along with current antihypertensive regimen.

## 2019-05-01 NOTE — Telephone Encounter (Signed)
We will continue to attempt medication adjustments, she has a number of antihypertensive allergies/intolerances.  Stay on present regimen and start hydralazine 25 mg twice daily.

## 2019-05-01 NOTE — Telephone Encounter (Signed)
Pt voiced understanding

## 2019-05-01 NOTE — Telephone Encounter (Signed)
Pt c/o BP remaining high SBP 180s/200s - DBP 60s - recent increase of losartan 50 mg bid, chlorthalidone 25 mg in the morning - denies any symptoms at this time - has has occasional dizziness but this is not a new symptom

## 2019-05-08 ENCOUNTER — Telehealth: Payer: Self-pay | Admitting: Cardiology

## 2019-05-08 ENCOUNTER — Other Ambulatory Visit: Payer: Self-pay | Admitting: *Deleted

## 2019-05-08 ENCOUNTER — Ambulatory Visit: Payer: Medicare Other | Admitting: Cardiovascular Disease

## 2019-05-08 MED ORDER — ISOSORBIDE MONONITRATE ER 30 MG PO TB24: 30.0000 mg | ORAL_TABLET | Freq: Every evening | ORAL | 2 refills | Status: DC

## 2019-05-08 MED ORDER — CLONIDINE HCL 0.1 MG PO TABS: 0.1000 mg | ORAL_TABLET | Freq: Two times a day (BID) | ORAL | 3 refills | Status: DC

## 2019-05-08 NOTE — Telephone Encounter (Signed)
Noted.  Blood pressure control is difficult in light of significant medication allergies/intolerances.  Please have her stop the hydralazine since I doubt we are going to be able to uptitrate it due to her concerns.  Start clonidine 0.1 mg twice daily.  Please also encourage her to follow-up with her PCP.

## 2019-05-08 NOTE — Telephone Encounter (Signed)
Blood Pressure still running high.  184/63  Last medication she has started is making her sick on her stomach

## 2019-05-08 NOTE — Telephone Encounter (Signed)
BP today 184/60 & HR 63. Reports BP has been around this number for the past week with the lowest SBP at 169. Says BP cuff is accurate and has been checking blood pressure with same arm and same time.  Has been taking hydralazine 25 mg BID for one week and thinks it is making her sick on the stomach especially when she gets up in the morning. Reports feeling sick on the stomach for at least 3 weeks but says its gotten worse. Patient reports the nausea is worse today. Advised that nausea is listed as a common side effect but should improve after several weeks. Advised to try eating crackers, breads, cereal to reduce nausea and avoid fried foods (BRAT).   Currently at work

## 2019-05-08 NOTE — Telephone Encounter (Signed)
Patient informed and verbalized understanding of plan. 

## 2019-05-09 MED ORDER — CLONIDINE HCL 0.1 MG PO TABS: 0.1000 mg | ORAL_TABLET | Freq: Every day | ORAL | 3 refills | Status: DC

## 2019-05-09 NOTE — Addendum Note (Signed)
Addended by: Merlene Laughter on: 05/09/2019 04:47 PM   Modules accepted: Orders

## 2019-05-09 NOTE — Telephone Encounter (Signed)
Patient started new medication this morning and has been sick with swimmy head since taking it.  Stated she doesn't feel she can handle this medication either

## 2019-05-09 NOTE — Telephone Encounter (Signed)
Reduce the dose of clonidine 0.1 mg once daily and take in the evening.

## 2019-05-09 NOTE — Telephone Encounter (Signed)
Fwd to provider for further advice.

## 2019-05-09 NOTE — Telephone Encounter (Signed)
Patient informed and verbalized understanding. Explained to patient that she is to contact her PCP about her BP. Verbalized understanding.

## 2019-05-15 ENCOUNTER — Telehealth: Payer: Self-pay | Admitting: Cardiology

## 2019-05-15 NOTE — Telephone Encounter (Signed)
Patient called stating she still cant tolerate medication

## 2019-05-16 NOTE — Telephone Encounter (Signed)
No answer

## 2019-05-18 ENCOUNTER — Other Ambulatory Visit: Payer: Self-pay | Admitting: Cardiology

## 2019-05-18 ENCOUNTER — Other Ambulatory Visit: Payer: Self-pay | Admitting: *Deleted

## 2019-05-18 MED ORDER — AMIODARONE HCL 200 MG PO TABS: 200.0000 mg | ORAL_TABLET | Freq: Every day | ORAL | 6 refills | Status: DC

## 2019-05-18 MED ORDER — CARVEDILOL 12.5 MG PO TABS: 12.5000 mg | ORAL_TABLET | Freq: Two times a day (BID) | ORAL | 6 refills | Status: DC

## 2019-05-31 ENCOUNTER — Ambulatory Visit: Payer: Medicare Other | Admitting: Cardiology

## 2019-06-01 ENCOUNTER — Other Ambulatory Visit: Payer: Self-pay

## 2019-06-01 ENCOUNTER — Ambulatory Visit (INDEPENDENT_AMBULATORY_CARE_PROVIDER_SITE_OTHER): Payer: Medicare Other | Admitting: Cardiology

## 2019-06-01 ENCOUNTER — Encounter: Payer: Self-pay | Admitting: Cardiology

## 2019-06-01 VITALS — BP 211/70 | HR 59 | Ht 64.0 in | Wt 143.4 lb

## 2019-06-01 DIAGNOSIS — I25119 Atherosclerotic heart disease of native coronary artery with unspecified angina pectoris: Secondary | ICD-10-CM | POA: Diagnosis not present

## 2019-06-01 DIAGNOSIS — I200 Unstable angina: Secondary | ICD-10-CM

## 2019-06-01 DIAGNOSIS — I10 Essential (primary) hypertension: Secondary | ICD-10-CM

## 2019-06-01 DIAGNOSIS — E782 Mixed hyperlipidemia: Secondary | ICD-10-CM

## 2019-06-01 DIAGNOSIS — I4819 Other persistent atrial fibrillation: Secondary | ICD-10-CM

## 2019-06-01 MED ORDER — SPIRONOLACTONE 25 MG PO TABS: 25.0000 mg | ORAL_TABLET | Freq: Every day | ORAL | 6 refills | Status: DC

## 2019-06-01 MED ORDER — ISOSORBIDE MONONITRATE ER 30 MG PO TB24: 30.0000 mg | ORAL_TABLET | Freq: Two times a day (BID) | ORAL | 2 refills | Status: DC

## 2019-06-01 NOTE — Progress Notes (Signed)
Cardiology Office Note  Date: 06/01/2019   ID: [name], [name] [birth year], MRN [id number]  PCP:  Renee Rival, NP  Cardiologist:  Rozann Lesches, MD Electrophysiologist:  None   Chief Complaint  Patient presents with  . Cardiac follow-up    History of Present Illness: Amber Herman is a 73 y.o. female last seen in November.  She presents today for a follow-up visit with her daughter.  She reports somewhat vague abdominal discomfort, rumbling feelings, is concerned that her medications are related.  Also lack of energy.  No definite angina symptoms.  Since last encounter including the medications adjustments at that time, we have tried both hydralazine and clonidine.  She could not tolerate hydralazine and is also concerned about clonidine as well.  She has a number of medication intolerances already documented which makes her care challenging.  Today I went over her medications in detail.  We discussed switching from chlorthalidone to Aldactone and increasing her Imdur to 30 mg twice daily.  We might even consider a clonidine patch if needed.  We will take things in stepwise fashion.  Past Medical History:  Diagnosis Date  . Allergic rhinitis   . Anemia   . Atrial fibrillation ([place])    Diagnosed 2013  . DM (diabetes mellitus), type 2 ([place])   . GERD (gastroesophageal reflux disease)   . Hyperlipidemia   . Hypertension   . Nephrolithiasis   . Transient ischemic attack (TIA)   . Vitamin D deficiency     Past Surgical History:  Procedure Laterality Date  . CARDIOVERSION N/A 02/22/2019   Procedure: CARDIOVERSION;  Surgeon: Donato Heinz, MD;  Location: [facility] ENDOSCOPY;  Service: Endoscopy;  Laterality: N/A;  . CHOLECYSTECTOMY    . LEFT HEART CATH AND CORONARY ANGIOGRAPHY N/A 02/21/2019   Procedure: LEFT HEART CATH AND CORONARY ANGIOGRAPHY;  Surgeon: [name], MD;  Location: [place] CV LAB;  Service: Cardiovascular;  Laterality: N/A;  . TEE  WITHOUT CARDIOVERSION N/A 02/22/2019   Procedure: TRANSESOPHAGEAL ECHOCARDIOGRAM (TEE);  Surgeon: Donato Heinz, MD;  Location: [facility] ENDOSCOPY;  Service: Endoscopy;  Laterality: N/A;    Current Outpatient Medications  Medication Sig Dispense Refill  . acetaminophen (TYLENOL) 325 MG tablet Take 2 tablets (650 mg total) by mouth every 8 (eight) hours as needed. 60 tablet 0  . ALPRAZolam (XANAX) 0.25 MG tablet Take 1 tablet (0.25 mg total) by mouth 2 (two) times daily as needed for anxiety or sleep. 12 tablet 0  . amiodarone (PACERONE) 200 MG tablet TAKE 1 TABLET BY MOUTH ONCE DAILY. 28 tablet 11  . apixaban (ELIQUIS) 5 MG TABS tablet Take 1 tablet (5 mg total) by mouth 2 (two) times daily. 60 tablet 1  . carvedilol (COREG) 12.5 MG tablet TAKE 1 TABLET BY MOUTH TWICE DAILY WITH A MEAL. 56 tablet 11  . FEROSUL 325 (65 Fe) MG tablet Take 325 mg by mouth daily.     . isosorbide mononitrate (IMDUR) 30 MG 24 hr tablet Take 1 tablet (30 mg total) by mouth 2 (two) times daily. 180 tablet 2  . losartan (COZAAR) 50 MG tablet Take 1 tablet (50 mg total) by mouth 2 (two) times daily. 180 tablet 3  . Magnesium 400 MG TABS Take 1 tablet by mouth daily. 90 tablet 2  . metFORMIN (GLUCOPHAGE) 850 MG tablet Take 850 mg by mouth 2 (two) times daily with a meal.     . nitroGLYCERIN (NITROSTAT) 0.4 MG SL tablet Place 1  tablet (0.4 mg total) under the tongue every 5 (five) minutes as needed for chest pain (CP or SOB). 9 tablet 2  . pantoprazole (PROTONIX) 40 MG tablet Take 40 mg by mouth daily.    . potassium chloride SA (KLOR-CON M20) 20 MEQ tablet Take 1 tablet by mouth daily.     . pravastatin (PRAVACHOL) 40 MG tablet Take 40 mg by mouth at bedtime.     . TRESIBA FLEXTOUCH 200 UNIT/ML SOPN Inject 12 Units into the skin at bedtime.     . triamcinolone cream (KENALOG) 0.1 % Apply 1 application topically 2 (two) times daily as needed. 1 application to affected area twice daily, Mix 1:1 with Eucerin externally      . vitamin B-12 (CYANOCOBALAMIN) 1000 MCG tablet Take 1,000 mcg by mouth daily.     Marland Kitchen VITAMIN D PO Take 1 capsule by mouth daily.    Marland Kitchen spironolactone (ALDACTONE) 25 MG tablet Take 1 tablet (25 mg total) by mouth daily. 30 tablet 6   No current facility-administered medications for this visit.   Allergies:  Amlodipine, Angiotensin receptor blockers, Calcium channel blockers, Latex, Nicardipine hcl, Simvastatin, Sulfa antibiotics, Sulfacetamide sodium, and Sulfasalazine   Social History: The patient  reports that she has never smoked. She has never used smokeless tobacco. She reports that she does not drink alcohol or use drugs.   ROS:  Please see the history of present illness. Otherwise, complete review of systems is positive for none.  All other systems are reviewed and negative.   Physical Exam: VS:  BP (!) 211/70   Pulse (!) 59   Ht 5\' 4"  (1.626 m)   Wt 143 lb 6.4 oz (65 kg)   SpO2 98%   BMI 24.61 kg/m , BMI Body mass index is 24.61 kg/m.  Wt Readings from Last 3 Encounters:  06/01/19 143 lb 6.4 oz (65 kg)  04/19/19 144 lb 3.2 oz (65.4 kg)  03/27/19 139 lb 5.3 oz (63.2 kg)    General: Elderly woman, appears comfortable at rest. HEENT: Conjunctiva and lids normal, wearing a mask. Neck: Supple, no elevated JVP or carotid bruits, no thyromegaly. Lungs: Clear to auscultation, nonlabored breathing at rest. Cardiac: Regular rate and rhythm, no S3, soft systolic murmur. Abdomen: Soft, nontender, bowel sounds present. Extremities: No pitting edema, distal pulses 2+. Skin: Warm and dry. Musculoskeletal: No kyphosis. Neuropsychiatric: Alert and oriented x3, affect grossly appropriate.  ECG:  An ECG dated 03/26/2019 was personally reviewed today and demonstrated:  Sinus rhythm with lead artifact.  Recent Labwork: 02/23/2019: TSH 4.818 03/27/2019: ALT 13; AST 13; Hemoglobin 10.2; Platelets 377 04/28/2019: BUN 22; Creatinine, Ser 0.83; Magnesium 1.7; Potassium 3.5; Sodium 134      Component Value Date/Time   CHOL 136 02/21/2019 0018   TRIG 145 02/21/2019 0018   HDL 40 (L) 02/21/2019 0018   CHOLHDL 3.4 02/21/2019 0018   VLDL 29 02/21/2019 0018   LDLCALC 67 02/21/2019 0018    Other Studies Reviewed Today:  Cardiac catheterization 02/21/2019: 1. 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 Widely patent left main, left circumflex, and RCA 3. Normal LV systolic function and normal LVEDP  Pt with low BP throughout the procedure but awake and stable. Treated with fluid bolus and discontinuation of IV NTG.  Will review with rounding team. Will resume eliquis tonight. Consider TEE/cardioversion tomorrow.  Echocardiogram 02/21/2019: 1. Left ventricular ejection fraction, by visual estimation, is 60 to 65%. The left ventricle  has normal function. Normal left ventricular size. There is mildly increased left ventricular hypertrophy. 2. Left ventricular diastolic function could not be evaluated pattern of LV diastolic filling. 3. Global right ventricle has normal systolic function.The right ventricular size is normal. No increase in right ventricular wall thickness. 4. Left atrial size was normal. 5. Right atrial size was normal. 6. Mild mitral annular calcification. 7. The mitral valve is normal in structure. No evidence of mitral valve regurgitation. No evidence of mitral stenosis. 8. The tricuspid valve is normal in structure. Tricuspid valve regurgitation was not visualized by color flow Doppler. 9. The aortic valve is normal in structure. Aortic valve regurgitation was not visualized by color flow Doppler. Mild aortic valve sclerosis without stenosis. 10. The pulmonic valve was grossly normal. Pulmonic valve regurgitation is not visualized by color flow Doppler. 11. Normal pulmonary artery systolic pressure. 12. The inferior vena cava is normal in size with greater than 50% respiratory variability,  suggesting right atrial pressure of 3 mmHg.  TEE 02/22/2019: 1. Left ventricular ejection fraction, by visual estimation, is 60 to 65%. The left ventricle has normal function. Normal left ventricular size. There is no left ventricular hypertrophy. 2. Global right ventricle has normal systolic function.The right ventricular size is normal. No increase in right ventricular wall thickness. 3. Left atrial size was normal. 4. No left atrial appendage thrombus. 5. The mitral valve is normal in structure. Mild mitral valve regurgitation. 6. The tricuspid valve is normal in structure. Tricuspid valve regurgitation is mild. 7. The aortic valve is tricuspid Aortic valve regurgitation was not visualized by color flow Doppler. 8. The pulmonic valve was grossly normal. Pulmonic valve regurgitation is trivial by color flow Doppler.  Chest x-ray 03/26/2019: FINDINGS: Cardiomegaly. Both lungs are clear. The visualized skeletal structures are unremarkable.  IMPRESSION: Cardiomegaly without acute abnormality of the lungs.  Assessment and Plan:  1.  Uncontrolled hypertension complicated by multiple medication intolerances.  Plan to continue present doses of Coreg and losartan.  We will increase Imdur to 30 mg twice daily and change chlorthalidone to Aldactone 25 mg daily.  Follow-up BMET with nurse blood pressure check in the next few weeks.  Is not entirely clear that clonidine was causing symptoms, but we will keep this out of the mix for now.  Might consider using a clonidine patch if necessary.  2.  CAD, moderate mid LAD stenosis by recent cardiac catheterization and plan to continue medical therapy.  She is not on aspirin given concurrent use of Eliquis.  Continue beta-blocker, nitrate, ARB, and statin.  3.  Persistent atrial fibrillation with CHA2DS2-VASc score of 7.  She continues on Eliquis and amiodarone.  4.  Mixed hyperlipidemia on Pravachol.  Medication Adjustments/Labs and Tests  Ordered: Current medicines are reviewed at length with the patient today.  Concerns regarding medicines are outlined above.   Tests Ordered: Orders Placed This Encounter  Procedures  . Basic metabolic panel    Medication Changes: Meds ordered this encounter  Medications  . isosorbide mononitrate (IMDUR) 30 MG 24 hr tablet    Sig: Take 1 tablet (30 mg total) by mouth 2 (two) times daily.    Dispense:  180 tablet    Refill:  2    Dose increased 06/01/2019  . spironolactone (ALDACTONE) 25 MG tablet    Sig: Take 1 tablet (25 mg total) by mouth daily.    Dispense:  30 tablet    Refill:  6    Stopping Chlorthalidone, med changed 06/01/2019  Disposition:  Follow up 2 weeks for nurse blood pressure check.  We will arrange further follow-up from there.  Signed, Satira Sark, MD, [facility] 06/01/2019 12:09 PM    [place] [facility] [place], [place], [place] [address] Phone: [phone]; Fax: [phone]

## 2019-06-01 NOTE — Patient Instructions (Addendum)
Medication Instructions:   Remain off of the Clonidine.  Stop Chlorthalidone.  Begin Aldactone 25mg  daily.   Increase Imdur to 30mg  twice a day   Continue all other medications.    Labwork:  BMET - orders given today.  Please do in 2 weeks, around 06/15/2019.  Office will contact with results via phone or letter.    Testing/Procedures: none  Follow-Up: 3 months   Any Other Special Instructions Will Be Listed Below (If Applicable).  Nurse visit for blood pressure check in office in 2 weeks.   Continue to monitor blood pressure at home.    If you need a refill on your cardiac medications before your next appointment, please call your pharmacy.

## 2019-06-09 ENCOUNTER — Telehealth: Payer: Self-pay | Admitting: *Deleted

## 2019-06-09 ENCOUNTER — Other Ambulatory Visit (HOSPITAL_COMMUNITY)
Admission: RE | Admit: 2019-06-09 | Discharge: 2019-06-09 | Disposition: A | Discharge status: Home / Self Care | Payer: Medicare Other | Source: Ambulatory Visit | Attending: Cardiology | Admitting: Cardiology

## 2019-06-09 DIAGNOSIS — I10 Essential (primary) hypertension: Secondary | ICD-10-CM | POA: Diagnosis present

## 2019-06-09 LAB — BASIC METABOLIC PANEL
Anion gap: 9 (ref 5–15)
BUN: 20 mg/dL (ref 8–23)
CO2: 28 mmol/L (ref 22–32)
Calcium: 9.2 mg/dL (ref 8.9–10.3)
Chloride: 96 mmol/L — ABNORMAL LOW (ref 98–111)
Creatinine, Ser: 0.93 mg/dL (ref 0.44–1.00)
GFR calc Af Amer: >60 mL/min (ref >60)
GFR calc non Af Amer: >60 mL/min (ref >60)
Glucose, Bld: 212 mg/dL — ABNORMAL HIGH (ref 70–99)
Potassium: 4.4 mmol/L (ref 3.5–5.1)
Sodium: 133 mmol/L — ABNORMAL LOW (ref 135–145)

## 2019-06-09 NOTE — Telephone Encounter (Signed)
-----   Message from Satira Sark, MD sent at 06/09/2019 12:49 PM EST ----- Results reviewed.  Potassium normal at 4.4 and creatinine normal at 0.93.  Continue with current plan.

## 2019-06-09 NOTE — Telephone Encounter (Signed)
Patient informed. Copy sent to PCP °

## 2019-06-14 ENCOUNTER — Other Ambulatory Visit: Payer: Self-pay | Admitting: Cardiology

## 2019-06-15 ENCOUNTER — Ambulatory Visit: Payer: Medicare Other | Admitting: *Deleted

## 2019-06-15 ENCOUNTER — Other Ambulatory Visit: Payer: Self-pay

## 2019-06-15 DIAGNOSIS — I10 Essential (primary) hypertension: Secondary | ICD-10-CM

## 2019-06-15 NOTE — Progress Notes (Signed)
Current medications reviewed.  Since she is hopefully going to tolerate the Aldactone, let's increase this to 25 mg twice daily.  She will need a BMET in 10 days.  You can let her know that her calcium was already checked with the lab work a week ago and it was normal.  If she still has the clonidine that we tried previously, this would be a next step for blood pressure management, but we will do 1 thing at a time.

## 2019-06-15 NOTE — Progress Notes (Signed)
Pt here for BP check after stopping chlorthalidone ans starting Aldactone 25mg  daily - pt c/o still being nauseated and "gas in her stomach" - BP today by nurse manual check is 174/96 HR 65 - pt requesting for [place] to order calcium level - says she was told level may be high and can't see pcp until March

## 2019-06-16 MED ORDER — SPIRONOLACTONE 25 MG PO TABS: 25.0000 mg | ORAL_TABLET | Freq: Two times a day (BID) | ORAL | 3 refills | Status: DC

## 2019-06-16 NOTE — Progress Notes (Signed)
Pt voiced understanding - lab order mailed to pt as requested - medication ist updated

## 2019-06-16 NOTE — Addendum Note (Signed)
Addended by: Julian Hy T on: 06/16/2019 10:23 AM   Modules accepted: Orders

## 2019-06-19 ENCOUNTER — Telehealth: Payer: Self-pay | Admitting: Cardiology

## 2019-06-19 NOTE — Telephone Encounter (Signed)
Patient having the covid vaccine this morning around 10 and would like to know if it is ok for her to take it with all her cardiac meds?

## 2019-06-19 NOTE — Telephone Encounter (Signed)
Pt aware that she could take Covid vaccine

## 2019-06-27 ENCOUNTER — Other Ambulatory Visit (HOSPITAL_COMMUNITY)
Admission: RE | Admit: 2019-06-27 | Discharge: 2019-06-27 | Disposition: A | Discharge status: Home / Self Care | Payer: Medicare Other | Source: Ambulatory Visit | Attending: Cardiology | Admitting: Cardiology

## 2019-06-27 ENCOUNTER — Other Ambulatory Visit: Payer: Self-pay | Admitting: *Deleted

## 2019-06-27 ENCOUNTER — Telehealth: Payer: Self-pay | Admitting: *Deleted

## 2019-06-27 DIAGNOSIS — I480 Paroxysmal atrial fibrillation: Secondary | ICD-10-CM

## 2019-06-27 DIAGNOSIS — I10 Essential (primary) hypertension: Secondary | ICD-10-CM | POA: Diagnosis present

## 2019-06-27 LAB — BASIC METABOLIC PANEL
Anion gap: 13 (ref 5–15)
BUN: 27 mg/dL — ABNORMAL HIGH (ref 8–23)
CO2: 22 mmol/L (ref 22–32)
Calcium: 9.3 mg/dL (ref 8.9–10.3)
Chloride: 92 mmol/L — ABNORMAL LOW (ref 98–111)
Creatinine, Ser: 0.94 mg/dL (ref 0.44–1.00)
GFR calc Af Amer: >60 mL/min (ref >60)
GFR calc non Af Amer: >60 mL/min (ref >60)
Glucose, Bld: 113 mg/dL — ABNORMAL HIGH (ref 70–99)
Potassium: 5.2 mmol/L — ABNORMAL HIGH (ref 3.5–5.1)
Sodium: 127 mmol/L — ABNORMAL LOW (ref 135–145)

## 2019-06-27 NOTE — Telephone Encounter (Signed)
-----   Message from Satira Sark, MD sent at 06/27/2019  2:06 PM EST ----- Results reviewed.  Follow-up lab work shows sodium 127 (has been in this range before), and potassium up to 5.2.  Have her stop potassium supplements completely.  Follow-up BMET in 14 days.

## 2019-06-27 NOTE — Telephone Encounter (Signed)
Patient informed and verbalized understanding of plan. Lab order faxed to [facility].  

## 2019-06-28 ENCOUNTER — Telehealth: Payer: Self-pay | Admitting: *Deleted

## 2019-06-28 ENCOUNTER — Encounter: Payer: Self-pay | Admitting: Gastroenterology

## 2019-06-28 NOTE — Telephone Encounter (Signed)
Patient contacted office to report that she discovered today that she has been taking spironolactone 25 mg twice daily from her pill back and from her pill bottle to equal 50 mg twice daily for the past 15 days.  BP today 139/69 & HR 69 Patient says she will take the medications back to the pharmacy to insure to that her regimen is correct Advised to restart spironolactone 25 mg twice daily tomorrow Advised to monitor for palpitations, increased fatigue and monitor BP & HR Verbalized understanding.

## 2019-06-30 ENCOUNTER — Telehealth: Payer: Self-pay | Admitting: Cardiology

## 2019-06-30 NOTE — Telephone Encounter (Signed)
Pt voiced understanding and will try 12.5 mg of Aldactone at bedtime

## 2019-06-30 NOTE — Telephone Encounter (Signed)
Pt c/o symptoms with taking spironolactone 25 mg daily (was on 25mg  bid but couldn't tolerate see previous phone notes) says every time she takes this it makes her confused and nauseated lasting for 30-40 mins - BP today was 123/104 HR 69

## 2019-06-30 NOTE — Telephone Encounter (Signed)
    By review of Dr. Myles Gip prior notes, she has multiple medication intolerances. On the phone note from 06/28/2019 it appears she had actually been taking 50 mg twice daily for the past 15 days and just restarted 25 mg twice daily yesterday.  Let's have her only try only taking a half tablet (12.5mg  daily) for several days before ruling this medication out. Can try taking in the evening hours if she takes her other BP medications in the AM. She might even be able to eventually tolerate 25mg  daily but would do so in a step-wise fashion given the high dose she has been on for the past few weeks.   Signed, Erma Heritage, PA-C 06/30/2019, 11:50 AM Pager: [phone]

## 2019-06-30 NOTE — Telephone Encounter (Signed)
Patient called stating she can not tolerate this medication and gets very sick.  Wants to be taken off it and prescribed something else

## 2019-07-11 ENCOUNTER — Telehealth: Payer: Self-pay | Admitting: Cardiology

## 2019-07-11 MED ORDER — SPIRONOLACTONE 25 MG PO TABS: 12.5000 mg | ORAL_TABLET | Freq: Every day | ORAL | 2 refills | Status: DC

## 2019-07-11 NOTE — Telephone Encounter (Signed)
[employer] returned a call .

## 2019-07-13 ENCOUNTER — Other Ambulatory Visit: Payer: Self-pay

## 2019-07-13 ENCOUNTER — Other Ambulatory Visit: Payer: Self-pay | Admitting: Acute Care

## 2019-07-13 ENCOUNTER — Other Ambulatory Visit (HOSPITAL_COMMUNITY)
Admission: RE | Admit: 2019-07-13 | Discharge: 2019-07-13 | Disposition: A | Discharge status: Home / Self Care | Payer: Medicare Other | Source: Ambulatory Visit | Attending: Cardiology | Admitting: Cardiology

## 2019-07-13 ENCOUNTER — Telehealth: Payer: Self-pay | Admitting: *Deleted

## 2019-07-13 ENCOUNTER — Other Ambulatory Visit (HOSPITAL_COMMUNITY)
Admission: RE | Admit: 2019-07-13 | Discharge: 2019-07-13 | Disposition: A | Discharge status: Home / Self Care | Payer: Medicare Other | Source: Ambulatory Visit | Attending: Acute Care | Admitting: Acute Care

## 2019-07-13 DIAGNOSIS — R0989 Other specified symptoms and signs involving the circulatory and respiratory systems: Secondary | ICD-10-CM

## 2019-07-13 DIAGNOSIS — I10 Essential (primary) hypertension: Secondary | ICD-10-CM | POA: Diagnosis present

## 2019-07-13 DIAGNOSIS — I480 Paroxysmal atrial fibrillation: Secondary | ICD-10-CM | POA: Diagnosis present

## 2019-07-13 LAB — BASIC METABOLIC PANEL
Anion gap: 8 (ref 5–15)
BUN: 11 mg/dL (ref 8–23)
CO2: 28 mmol/L (ref 22–32)
Calcium: 9 mg/dL (ref 8.9–10.3)
Chloride: 98 mmol/L (ref 98–111)
Creatinine, Ser: 0.77 mg/dL (ref 0.44–1.00)
GFR calc Af Amer: >60 mL/min (ref >60)
GFR calc non Af Amer: >60 mL/min (ref >60)
Glucose, Bld: 94 mg/dL (ref 70–99)
Potassium: 3.9 mmol/L (ref 3.5–5.1)
Sodium: 134 mmol/L — ABNORMAL LOW (ref 135–145)

## 2019-07-13 LAB — FOLATE: Folate: 13.4 ng/mL (ref >5.9)

## 2019-07-13 LAB — VITAMIN B12: Vitamin B-12: 981 pg/mL — ABNORMAL HIGH (ref 180–914)

## 2019-07-13 LAB — FERRITIN: Ferritin: 36 ng/mL (ref 11–307)

## 2019-07-13 LAB — TSH: TSH: 19.298 u[IU]/mL — ABNORMAL HIGH (ref 0.350–4.500)

## 2019-07-13 LAB — VITAMIN D 25 HYDROXY (VIT D DEFICIENCY, FRACTURES): Vit D, 25-Hydroxy: 36.65 ng/mL (ref 30–100)

## 2019-07-13 NOTE — Telephone Encounter (Signed)
-----   Message from Satira Sark, MD sent at 07/13/2019  2:33 PM EST ----- Results reviewed.  Lab work looks better.  Sodium near normal, potassium normal, and no change in renal function.

## 2019-07-16 LAB — VITAMIN B1: Vitamin B1 (Thiamine): 96.7 nmol/L (ref 66.5–200.0)

## 2019-07-18 NOTE — Telephone Encounter (Signed)
Patient informed. Copy sent to PCP °

## 2019-07-21 ENCOUNTER — Ambulatory Visit: Payer: Medicare Other | Admitting: Gastroenterology

## 2019-07-28 ENCOUNTER — Ambulatory Visit (HOSPITAL_COMMUNITY)
Admission: RE | Admit: 2019-07-28 | Discharge: 2019-07-28 | Disposition: A | Discharge status: Home / Self Care | Payer: Medicare Other | Source: Ambulatory Visit | Attending: Acute Care | Admitting: Acute Care

## 2019-07-28 ENCOUNTER — Other Ambulatory Visit: Payer: Self-pay

## 2019-07-28 DIAGNOSIS — R0989 Other specified symptoms and signs involving the circulatory and respiratory systems: Secondary | ICD-10-CM | POA: Diagnosis present

## 2019-08-02 ENCOUNTER — Encounter: Payer: Self-pay | Admitting: Gastroenterology

## 2019-08-02 ENCOUNTER — Telehealth: Payer: Self-pay | Admitting: *Deleted

## 2019-08-02 ENCOUNTER — Other Ambulatory Visit: Payer: Self-pay

## 2019-08-02 ENCOUNTER — Ambulatory Visit (INDEPENDENT_AMBULATORY_CARE_PROVIDER_SITE_OTHER): Payer: Medicare Other | Admitting: Gastroenterology

## 2019-08-02 VITALS — BP 222/76 | HR 65 | Temp 97.1°F | Ht 64.0 in | Wt 144.4 lb

## 2019-08-02 DIAGNOSIS — K219 Gastro-esophageal reflux disease without esophagitis: Secondary | ICD-10-CM | POA: Diagnosis not present

## 2019-08-02 DIAGNOSIS — R143 Flatulence: Secondary | ICD-10-CM

## 2019-08-02 DIAGNOSIS — I10 Essential (primary) hypertension: Secondary | ICD-10-CM

## 2019-08-02 DIAGNOSIS — R198 Other specified symptoms and signs involving the digestive system and abdomen: Secondary | ICD-10-CM | POA: Diagnosis not present

## 2019-08-02 MED ORDER — CLONIDINE HCL 0.1 MG PO TABS: 0.1000 mg | ORAL_TABLET | Freq: Two times a day (BID) | ORAL | 3 refills | Status: DC

## 2019-08-02 MED ORDER — PANTOPRAZOLE SODIUM 40 MG PO TBEC: 40.0000 mg | DELAYED_RELEASE_TABLET | Freq: Two times a day (BID) | ORAL | 5 refills | Status: DC

## 2019-08-02 NOTE — Telephone Encounter (Signed)
Pt was seen today by gastro and was told to contact us regarding pt BP today 222/76 - says BP has been this high at home as well - pt denies any symptoms or medication changes - still taking losartan 50 mg bid, aldactone 12.5 mg dialy Imdur 30 mg bid, amiodarone 200 mg daily

## 2019-08-02 NOTE — Patient Instructions (Signed)
1. Increase pantoprazole to twice daily, 30 minutes before breakfast and 30 minutes before evening meal. 2. Add a probiotic daily, such as [employer] or [employer]. You can try any affordable over the counter probiotic for next one month. 3. Go for labs. We will contact you with results as available.  4. Referral to Dr. Dorris Fetch for thyroid and refractory high blood pressure.  5. Contact Dr. Domenic Polite regarding persistent high blood pressure.  6. Return here in six weeks. Once your blood pressure is better controlled, we will move towards a colonoscopy and upper endoscopy.

## 2019-08-02 NOTE — Telephone Encounter (Signed)
Called back to Ms Leitha Schuller pt daughter and no answer no voicemail - spoke with Freida Busman pt other daughter who voiced understanding and would inform pt and daughter- will send to [facility] as requested

## 2019-08-02 NOTE — Progress Notes (Signed)
Primary Care Physician:  Renee Rival, NP  Primary Gastroenterologist:  Barney Drain, MD   Chief Complaint  Patient presents with  . Gastroesophageal Reflux    c/o acid coming back up in throat  . Nausea    denies    HPI:  Amber Herman is a 74 y.o. female here at the request of Angelina Ok, FNP for further evaluation of GERD, nausea.  Patient's past medical history significant for atrial fibrillation on Eliquis, hypertension, TIA, diabetes, CAD.  In September 2020 she presented with NSTEMI, A. fib with RVR and underwent cardiac catheterization on February 21, 2019.  Noted to have moderate mid LAD stenosis after the origin of the second diagonal branch-favor medical therapy.  Underwent TEE/DCCV on February 22, 2019.   Patient reports having significant issues with hypertension for the past several months.  Multiple medication changes but nothing seems to be controlling her blood pressure.  She has been working with her cardiologist Dr. Domenic Polite.  She has multiple medication challenges noted which makes managing her blood pressure more challenging.  Blood pressure in our office today was quite elevated at 222/76, consistent with multiple readings recently.  Patient wonders if her nausea and confusion is related to her significantly elevated blood pressure.  Complains of feeling swimmy headed at times but not currently.  She has had some memory loss and went to see neurology recently.  MRI brain performed along with multiple labs.  MRI brain showed mild parenchymal volume loss and moderate chronic small vessel ischemia but no acute abnormalities.  Labs show TSH of 19.298.  Patient is awaiting callback from her PCP regarding abnormal TSH.  She states that she was supposed to receive a referral to endocrinology regarding her thyroid as well as refractory hypertension.  At this time she continues to have nausea but has improved. No heartburn but acid refluxes back up intermittently for  the past 6 to 8 months.  Has been on pantoprazole about that length of time. BM 1-2 times per day. No melena, brbpr.  Sometimes she has alternating constipation and diarrhea.  This is a newer symptom for her.  Denies abdominal pain.  She inquires about an endoscopy and colonoscopy.  She states her last colonoscopy was more than 10 years ago.   Current Outpatient Medications  Medication Sig Dispense Refill  . acetaminophen (TYLENOL) 325 MG tablet Take 2 tablets (650 mg total) by mouth every 8 (eight) hours as needed. 60 tablet 0  . ALPRAZolam (XANAX) 0.25 MG tablet Take 1 tablet (0.25 mg total) by mouth 2 (two) times daily as needed for anxiety or sleep. 12 tablet 0  . amiodarone (PACERONE) 200 MG tablet TAKE 1 TABLET BY MOUTH ONCE DAILY. 28 tablet 11  . apixaban (ELIQUIS) 5 MG TABS tablet Take 1 tablet (5 mg total) by mouth 2 (two) times daily. 60 tablet 1  . carvedilol (COREG) 12.5 MG tablet TAKE 1 TABLET BY MOUTH TWICE DAILY WITH A MEAL. 56 tablet 11  . FEROSUL 325 (65 Fe) MG tablet Take 325 mg by mouth daily.     . isosorbide mononitrate (IMDUR) 30 MG 24 hr tablet Take 1 tablet (30 mg total) by mouth 2 (two) times daily. 180 tablet 2  . losartan (COZAAR) 50 MG tablet Take 1 tablet (50 mg total) by mouth 2 (two) times daily. 180 tablet 3  . magnesium oxide (MAG-OX) 400 (241.3 Mg) MG tablet TAKE 1 TABLET BY MOUTH ONCE DAILY. 28 tablet 11  . metFORMIN (GLUCOPHAGE)  850 MG tablet Take 850 mg by mouth 2 (two) times daily with a meal.     . nitroGLYCERIN (NITROSTAT) 0.4 MG SL tablet Place 1 tablet (0.4 mg total) under the tongue every 5 (five) minutes as needed for chest pain (CP or SOB). 9 tablet 2  . pantoprazole (PROTONIX) 40 MG tablet Take 40 mg by mouth daily.    . pravastatin (PRAVACHOL) 40 MG tablet Take 40 mg by mouth at bedtime.     Marland Kitchen spironolactone (ALDACTONE) 25 MG tablet Take 0.5 tablets (12.5 mg total) by mouth at bedtime. 45 tablet 2  . TRESIBA FLEXTOUCH 200 UNIT/ML SOPN Inject 16 Units  into the skin at bedtime.     . triamcinolone cream (KENALOG) 0.1 % Apply 1 application topically 2 (two) times daily as needed. 1 application to affected area twice daily, Mix 1:1 with Eucerin externally    . vitamin B-12 (CYANOCOBALAMIN) 1000 MCG tablet Take 1,000 mcg by mouth daily.     Marland Kitchen VITAMIN D PO Take 1 capsule by mouth daily.     No current facility-administered medications for this visit.    Allergies as of 08/02/2019 - Review Complete 08/02/2019  Allergen Reaction Noted  . Amlodipine  11/25/2011  . Angiotensin receptor blockers  11/25/2011  . Calcium channel blockers  11/25/2011  . Latex Hives and Itching 02/21/2019  . Nicardipine hcl  09/18/2014  . Simvastatin  11/25/2011  . Sulfa antibiotics Itching and Rash 11/25/2011  . Sulfacetamide sodium Itching and Rash 09/18/2014  . Sulfasalazine Itching and Rash 11/25/2011    Past Medical History:  Diagnosis Date  . Allergic rhinitis   . Anemia   . Atrial fibrillation ([place])    Diagnosed 2013  . DM (diabetes mellitus), type 2 ([place])   . GERD (gastroesophageal reflux disease)   . Hyperlipidemia   . Hypertension   . Nephrolithiasis   . Transient ischemic attack (TIA)   . Vitamin D deficiency     Past Surgical History:  Procedure Laterality Date  . CARDIOVERSION N/A 02/22/2019   Procedure: CARDIOVERSION;  Surgeon: Donato Heinz, MD;  Location: [facility] ENDOSCOPY;  Service: Endoscopy;  Laterality: N/A;  . CHOLECYSTECTOMY    . COLONOSCOPY     normal per patient, more than 10 years ago  . LEFT HEART CATH AND CORONARY ANGIOGRAPHY N/A 02/21/2019   Procedure: LEFT HEART CATH AND CORONARY ANGIOGRAPHY;  Surgeon: [name], MD;  Location: [place] CV LAB;  Service: Cardiovascular;  Laterality: N/A;  . TEE WITHOUT CARDIOVERSION N/A 02/22/2019   Procedure: TRANSESOPHAGEAL ECHOCARDIOGRAM (TEE);  Surgeon: Donato Heinz, MD;  Location: [facility] ENDOSCOPY;  Service: Endoscopy;  Laterality: N/A;    Family History   Problem Relation Age of Onset  . Hypertension Father   . Colon cancer Neg Hx     Social History   Socioeconomic History  . Marital status: Widowed    Spouse name: Not on file  . Number of children: Not on file  . Years of education: Not on file  . Highest education level: Not on file  Occupational History  . Not on file  Tobacco Use  . Smoking status: Never Smoker  . Smokeless tobacco: Never Used  Substance and Sexual Activity  . Alcohol use: No  . Drug use: No  . Sexual activity: Not Currently  Other Topics Concern  . Not on file  Social History Narrative  . Not on file   Social Determinants of Health   Financial Resource Strain: Low Risk   .  Difficulty of Paying Living Expenses: Not hard at all  Food Insecurity: Unknown  . Worried About Charity fundraiser in the Last Year: Patient refused  . Ran Out of Food in the Last Year: Patient refused  Transportation Needs: Unknown  . Lack of Transportation (Medical): Patient refused  . Lack of Transportation (Non-Medical): Patient refused  Physical Activity: Unknown  . Days of Exercise per Week: Patient refused  . Minutes of Exercise per Session: Patient refused  Stress: Stress Concern Present  . Feeling of Stress : Rather much  Social Connections: Somewhat Isolated  . Frequency of Communication with Friends and Family: More than three times a week  . Frequency of Social Gatherings with Friends and Family: Twice a week  . Attends Religious Services: 1 to 4 times per year  . Active Member of Clubs or Organizations: No  . Attends Archivist Meetings: Never  . Marital Status: Widowed  Intimate Partner Violence: Unknown  . Fear of Current or Ex-Partner: Patient refused  . Emotionally Abused: Patient refused  . Physically Abused: Patient refused  . Sexually Abused: Patient refused      ROS:  General: Negative for anorexia, weight loss, fever, chills, fatigue, weakness.  See HPI Eyes: Negative for vision  changes.  ENT: Negative for hoarseness, difficulty swallowing , nasal congestion. CV: Negative for chest pain, angina, palpitations, dyspnea on exertion, peripheral edema.  Respiratory: Negative for dyspnea at rest, dyspnea on exertion, cough, sputum, wheezing.  GI: See history of present illness. GU:  Negative for dysuria, hematuria, urinary incontinence, urinary frequency, nocturnal urination.  MS: Negative for joint pain, low back pain.  Derm: Negative for rash or itching.  Neuro: Negative for weakness, abnormal sensation, seizure, frequent headaches, positive memory loss, confusion.  Psych: Negative for anxiety, depression, suicidal ideation, hallucinations.  Endo: Negative for unusual weight change.  Heme: Negative for bruising or bleeding. Allergy: Negative for rash or hives.    Physical Examination:  BP (!) 222/76   Pulse 65   Temp (!) 97.1 F (36.2 C) (Oral)   Ht 5\' 4"  (1.626 m)   Wt 144 lb 6.4 oz (65.5 kg)   BMI 24.79 kg/m    General: Well-nourished, well-developed in no acute distress.  Head: Normocephalic, atraumatic.   Eyes: Conjunctiva pink, no icterus. Mouth:masked Neck: Supple without thyromegaly, masses, or lymphadenopathy.  Lungs: Clear to auscultation bilaterally.  Heart: Regular rate and rhythm, no murmurs rubs or gallops.  Abdomen: Bowel sounds are normal, nontender, nondistended, no hepatosplenomegaly or masses, no abdominal bruits or    hernia , no rebound or guarding.   Rectal: not performed Extremities: No lower extremity edema. No clubbing or deformities.  Neuro: Alert and oriented x 4 , grossly normal neurologically.  Skin: Warm and dry, no rash or jaundice.   Psych: Alert and cooperative, normal mood and affect.  Labs: Lab Results  Component Value Date   CREATININE 0.77 07/13/2019   BUN 11 07/13/2019   NA 134 (L) 07/13/2019   K 3.9 07/13/2019   CL 98 07/13/2019   CO2 28 07/13/2019   Lab Results  Component Value Date   ALT 13 03/27/2019    AST 13 (L) 03/27/2019   ALKPHOS 39 03/27/2019   BILITOT 0.4 03/27/2019   Lab Results  Component Value Date   WBC 8.6 03/27/2019   HGB 10.2 (L) 03/27/2019   HCT 29.9 (L) 03/27/2019   MCV 90.1 03/27/2019   PLT 377 03/27/2019   Lab Results  Component Value  Date   FERRITIN 36 07/13/2019   Lab Results  Component Value Date   VITAMINB12 981 (H) 07/13/2019   Lab Results  Component Value Date   FOLATE 13.4 07/13/2019   Lab Results  Component Value Date   TSH 19.298 (H) 07/13/2019      Imaging Studies: MR BRAIN WO CONTRAST  Result Date: 07/28/2019 CLINICAL DATA:  Confusion, hypertension, nausea and vomiting EXAM: MRI HEAD WITHOUT CONTRAST TECHNIQUE: Multiplanar, multiecho pulse sequences of the brain and surrounding structures were obtained without intravenous contrast. COMPARISON:  MRI of the brain June 29, 2014. FINDINGS: Brain: No acute infarction, hemorrhage, hydrocephalus, extra-axial collection or mass lesion. Scattered of and confluent foci of T2 hyperintensity are seen within the white matter of the cerebral hemispheres, nonspecific, most likely related to chronic small vessel ischemia. Mild prominence of the supratentorial ventricles and cerebral sulci reflecting parenchymal volume loss. Vascular: Normal flow voids. Skull and upper cervical spine: Normal marrow signal. Sinuses/Orbits: Bilateral lens surgery. Paranasal sinuses are clear. Other: None. IMPRESSION: 1. No acute intracranial abnormality. 2. Mild parenchymal volume loss and moderate chronic small vessel ischemia. Electronically Signed   By: Pedro Earls M.D.   On: 07/28/2019 15:53    Impression/plan:  Very pleasant 74 year old female with multiple comorbidities as outlined above presenting for further evaluation of nausea, reflux.  Reports typical heartburn well controlled on pantoprazole 40 mg daily.  Frequently has regurgitation.  Tries not to overeat.  Tries to allow time between eating and  laying down.  Would consider upper endoscopy for further evaluation of refractory symptoms but at this time she needs to have her hypertension better controlled in order to be candidate for anesthesia/endoscopy.  She also complains of change in bowel habits with alternating constipation and diarrhea.  Last colonoscopy over 10 years ago.  She is inquiring about updating a colonoscopy as well.  Would offer her colonoscopy and upper endoscopy once her blood pressure is better controlled.  In the meantime we will increase her pantoprazole to twice daily before meals.  She has had some increased gas and bloating as well, will try probiotic daily.  Not mention previously, she has some mild normocytic anemia and labs need to be updated at this time.  For hypertension she will contact Dr. Domenic Polite.  She is also had recent abnormal TSH and refractory hypertension and would benefit from endocrinology evaluation.  We will make referral. Hypertension management may be difficult due to her multiple drug intolerances, would consider other etiologies as well including endocrinology source versus renal artery stenosis.  We will have the patient come back in 6 weeks for follow-up.  If she has better management of blood pressure at that time we will move towards a colonoscopy and upper endoscopy.  She will call in interim with any questions or concerns.

## 2019-08-02 NOTE — Telephone Encounter (Signed)
Blood pressure control has been very challenging in light of multiple medication intolerances.  We did attempt clonidine previously and it was not entirely clear that this was causing symptoms in the setting of other medication intolerances.  Let's try clonidine 0.1 mg twice daily and continue the remainder of her medical regimen.  If we cannot get her blood pressure controlled we need to make a referral to our hypertension clinic in [place].

## 2019-08-03 ENCOUNTER — Encounter: Payer: Self-pay | Admitting: Gastroenterology

## 2019-08-03 ENCOUNTER — Telehealth: Payer: Self-pay | Admitting: Cardiology

## 2019-08-03 LAB — CBC WITH DIFFERENTIAL/PLATELET
Absolute Monocytes: 632 cells/uL (ref 200–950)
Basophils Absolute: 28 cells/uL (ref 0–200)
Basophils Relative: 0.4 %
Eosinophils Absolute: 128 cells/uL (ref 15–500)
Eosinophils Relative: 1.8 %
HCT: 31.5 % — ABNORMAL LOW (ref 35.0–45.0)
Hemoglobin: 10.4 g/dL — ABNORMAL LOW (ref 11.7–15.5)
Lymphs Abs: 1931 cells/uL (ref 850–3900)
MCH: 30.2 pg (ref 27.0–33.0)
MCHC: 33 g/dL (ref 32.0–36.0)
MCV: 91.6 fL (ref 80.0–100.0)
MPV: 10.5 fL (ref 7.5–12.5)
Monocytes Relative: 8.9 %
Neutro Abs: 4381 cells/uL (ref 1500–7800)
Neutrophils Relative %: 61.7 %
Platelets: 322 10*3/uL (ref 140–400)
RBC: 3.44 10*6/uL — ABNORMAL LOW (ref 3.80–5.10)
RDW: 13.3 % (ref 11.0–15.0)
Total Lymphocyte: 27.2 %
WBC: 7.1 10*3/uL (ref 3.8–10.8)

## 2019-08-03 LAB — IRON,TIBC AND FERRITIN PANEL
%SAT: 13 % (calc) — ABNORMAL LOW (ref 16–45)
Ferritin: 33 ng/mL (ref 16–288)
Iron: 40 ug/dL — ABNORMAL LOW (ref 45–160)
TIBC: 308 mcg/dL (calc) (ref 250–450)

## 2019-08-03 NOTE — Telephone Encounter (Signed)
Pt just started clonidine 0.1 mg today and c/o dizziness but BP has came down 140s/80s - pt agreeable to try medication for a week to see if she can tolerate - pt will call us back if dizziness worsens

## 2019-08-03 NOTE — Telephone Encounter (Signed)
Patient called stating that the clonidine 0.1 mg is making her dizzy.

## 2019-08-07 ENCOUNTER — Telehealth: Payer: Self-pay | Admitting: Cardiology

## 2019-08-07 DIAGNOSIS — I10 Essential (primary) hypertension: Secondary | ICD-10-CM

## 2019-08-07 NOTE — Telephone Encounter (Signed)
Patient called stating that she cannot take clonidine 0.1 mg She stated that she actually passed out after taking it.

## 2019-08-07 NOTE — Telephone Encounter (Signed)
Please see my telephone communication from March 3.  Her blood pressure has been very challenging to control in light of multiple medication intolerances.  Please see if she would be willing to be evaluated in our hypertension clinic in [place].

## 2019-08-07 NOTE — Telephone Encounter (Signed)
Pt agreeable to referral - will forward to schedulers

## 2019-08-07 NOTE — Telephone Encounter (Signed)
Pt says she passed out Saturday for a few minutes and can falls asleep without knowing it since starting clonidine - only took it 3 days and stopped - BP 188/69 HR 59 today

## 2019-08-10 ENCOUNTER — Telehealth: Payer: Self-pay | Admitting: *Deleted

## 2019-08-10 NOTE — Telephone Encounter (Signed)
Left message to call back  

## 2019-08-10 NOTE — Telephone Encounter (Signed)
Follow up  Pt returning call. She said the best time to call her on home phone# is after 12 o'clock. In the morning call her work #

## 2019-08-10 NOTE — Telephone Encounter (Signed)
-----   Message from Roland Earl sent at 08/10/2019 10:31 AM EST ----- Regarding: FW: [place] ----- Message ----- From: Delfino Lovett T Sent: 08/10/2019   9:02 AM EST To: Roland Earl Subject: RE: NEEDS APPOINTMENT FOR HYPERTENSION         Dr. Oval Linsey ----- Message ----- From: Roland Earl Sent: 08/10/2019   8:40 AM EST To: Chanda Busing Subject: RE: NEEDS APPOINTMENT FOR HYPERTENSION         Is this for Dr. Oval Linsey or the Phamr D? ----- Message ----- From: Delfino Lovett T Sent: 08/10/2019   8:29 AM EST To: Roland Earl Subject: NEEDS APPOINTMENT FOR HYPERTENSION             Good Morning to you,  Can you schedule this patient for the hypertension clinic?    Thank you.  Vicky

## 2019-08-14 NOTE — Telephone Encounter (Signed)
Patient has been scheduled for 08/16/2019

## 2019-08-16 ENCOUNTER — Telehealth: Payer: Self-pay | Admitting: Cardiovascular Disease

## 2019-08-16 ENCOUNTER — Ambulatory Visit (INDEPENDENT_AMBULATORY_CARE_PROVIDER_SITE_OTHER): Payer: Medicare Other | Admitting: Cardiovascular Disease

## 2019-08-16 ENCOUNTER — Encounter: Payer: Self-pay | Admitting: Cardiovascular Disease

## 2019-08-16 ENCOUNTER — Other Ambulatory Visit: Payer: Self-pay

## 2019-08-16 ENCOUNTER — Telehealth: Payer: Self-pay | Admitting: Licensed Clinical Social Worker

## 2019-08-16 VITALS — BP 182/60 | HR 68 | Ht 64.0 in | Wt 143.0 lb

## 2019-08-16 DIAGNOSIS — I150 Renovascular hypertension: Secondary | ICD-10-CM | POA: Diagnosis not present

## 2019-08-16 DIAGNOSIS — I480 Paroxysmal atrial fibrillation: Secondary | ICD-10-CM

## 2019-08-16 DIAGNOSIS — I10 Essential (primary) hypertension: Secondary | ICD-10-CM | POA: Diagnosis not present

## 2019-08-16 MED ORDER — CLONIDINE 0.1 MG/24HR TD PTWK: 0.1000 mg | MEDICATED_PATCH | TRANSDERMAL | 12 refills | Status: DC

## 2019-08-16 NOTE — Progress Notes (Signed)
Hypertension Clinic Initial Assessment:    Date:  08/16/2019   ID:  Dannetta, Tilghman [birth year], MRN [id number]  PCP:  Renee Rival, NP  Cardiologist:  Rozann Lesches, MD  Nephrologist:  Referring MD: Renee Rival, NP   CC: Hypertension  History of Present Illness:    SHAUNI MCCORKELL is a 74 y.o. female with a hx of atrial fibrillation, diabetes, hypertension, hyperlipidemia, and anemia here to establish care in the hypertension clinic. She reports having hypertension for many years. However in the last year or 2 it has been more difficult to control.  She has been working with Dr. Domenic Polite to control her blood pressure but that struggled with multiple medication intolerances.  She last saw him 12/31 and chlorthalidone was switched to spironolactone 25mg . However it made her nauseous so she stopped it. Upon calling her pharmacy it appears that she is still taking this medication. When she checks her blood pressure at home lately it is in the 180s over 60s. She self discontinued clonidine because it was causing nausea. 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 In the past she likes to walk but hasn't been doing this lately because she is afraid her blood pressure is too high. She struggles with eating a healthy diet and mostly eats out. She does try to limit her sodium intake but doesn't follow any particular guidelines. She reports taking her medications every day, though not always at the same time.  She does not smoke, drink alcohol or caffeine.    Previous antihypertensives: Amlodipine- sick on stomach Losartan Nicardipine Spironolactone Clonidine Hydralazine Spironolactone- felt sick, nausea, syncope  Past Medical History:  Diagnosis Date  . Allergic rhinitis   . Anemia   . Atrial fibrillation ([place])    Diagnosed 2013  . DM (diabetes mellitus), type 2 ([place])   . GERD (gastroesophageal reflux disease)   . Hyperlipidemia   . Hypertension     . Nephrolithiasis   . Transient ischemic attack (TIA)   . Vitamin D deficiency     Past Surgical History:  Procedure Laterality Date  . CARDIOVERSION N/A 02/22/2019   Procedure: CARDIOVERSION;  Surgeon: Donato Heinz, MD;  Location[facility] ENDOSCOPY;  Service: Endoscopy;  Laterality: N/A;  . CHOLECYSTECTOMY    . COLONOSCOPY     normal per patient, more than 10 years ago  . LEFT HEART CATH AND CORONARY ANGIOGRAPHY N/A 02/21/2019   Procedure: LEFT HEART CATH AND CORONARY ANGIOGRAPHY;  Surgeon: [name], MD;  Location: [facility] CV LAB;  Service: Cardiovascular;  Laterality: N/A;  . TEE WITHOUT CARDIOVERSION N/A 02/22/2019   Procedure: TRANSESOPHAGEAL ECHOCARDIOGRAM (TEE);  Surgeon: Donato Heinz, MD;  Location: [facility] ENDOSCOPY;  Service: Endoscopy;  Laterality: N/A;    Current Medications: Current Meds  Medication Sig  . acetaminophen (TYLENOL) 325 MG tablet Take 2 tablets (650 mg total) by mouth every 8 (eight) hours as needed.  . ALPRAZolam (XANAX) 0.25 MG tablet Take 1 tablet (0.25 mg total) by mouth 2 (two) times daily as needed for anxiety or sleep.  Marland Kitchen amiodarone (PACERONE) 200 MG tablet TAKE 1 TABLET BY MOUTH ONCE DAILY.  Marland Kitchen apixaban (ELIQUIS) 5 MG TABS tablet Take 1 tablet (5 mg total) by mouth 2 (two) times daily.  . busPIRone (BUSPAR) 5 MG tablet Take 5 mg by mouth.  . carvedilol (COREG) 12.5 MG tablet TAKE 1 TABLET BY MOUTH TWICE DAILY WITH A MEAL.  . Dulaglutide (TRULICITY [place])  Inject 16 Units into the skin.  Marland Kitchen FEROSUL 325 (65 Fe) MG tablet Take 325 mg by mouth daily.   . isosorbide mononitrate (IMDUR) 30 MG 24 hr tablet Take 1 tablet (30 mg total) by mouth 2 (two) times daily.  Marland Kitchen losartan (COZAAR) 50 MG tablet Take 1 tablet (50 mg total) by mouth 2 (two) times daily.  . magnesium oxide (MAG-OX) 400 (241.3 Mg) MG tablet TAKE 1 TABLET BY MOUTH ONCE DAILY.  . metFORMIN (GLUCOPHAGE) 850 MG tablet Take 850 mg by mouth 2 (two) times daily with a meal.   .  nitroGLYCERIN (NITROSTAT) 0.4 MG SL tablet Place 1 tablet (0.4 mg total) under the tongue every 5 (five) minutes as needed for chest pain (CP or SOB).  . pantoprazole (PROTONIX) 40 MG tablet Take 1 tablet (40 mg total) by mouth 2 (two) times daily before a meal.  . pravastatin (PRAVACHOL) 40 MG tablet Take 40 mg by mouth at bedtime.   Marland Kitchen spironolactone (ALDACTONE) 25 MG tablet Take 0.5 tablets (12.5 mg total) by mouth at bedtime.  . TRESIBA FLEXTOUCH 200 UNIT/ML SOPN Inject 16 Units into the skin at bedtime.   . triamcinolone cream (KENALOG) 0.1 % Apply 1 application topically 2 (two) times daily as needed. 1 application to affected area twice daily, Mix 1:1 with Eucerin externally  . vitamin B-12 (CYANOCOBALAMIN) 1000 MCG tablet Take 1,000 mcg by mouth daily.   Marland Kitchen VITAMIN D PO Take 1 capsule by mouth daily.  . [DISCONTINUED] cloNIDine (CATAPRES) 0.1 MG tablet Take 1 tablet (0.1 mg total) by mouth 2 (two) times daily. (Patient taking differently: Take by mouth 2 (two) times daily. )     Allergies:   Amlodipine, Angiotensin receptor blockers, Calcium channel blockers, Latex, Nicardipine hcl, Simvastatin, Sulfa antibiotics, Sulfacetamide sodium, and Sulfasalazine   Social History   Socioeconomic History  . Marital status: Widowed    Spouse name: Not on file  . Number of children: Not on file  . Years of education: Not on file  . Highest education level: Not on file  Occupational History  . Not on file  Tobacco Use  . Smoking status: Never Smoker  . Smokeless tobacco: Never Used  Substance and Sexual Activity  . Alcohol use: No  . Drug use: No  . Sexual activity: Not Currently  Other Topics Concern  . Not on file  Social History Narrative  . Not on file   Social Determinants of Health   Financial Resource Strain: Low Risk   . Difficulty of Paying Living Expenses: Not hard at all  Food Insecurity: Unknown  . Worried About Charity fundraiser in the Last Year: Patient refused  . Ran  Out of Food in the Last Year: Patient refused  Transportation Needs: Unknown  . Lack of Transportation (Medical): Patient refused  . Lack of Transportation (Non-Medical): Patient refused  Physical Activity: Unknown  . Days of Exercise per Week: Patient refused  . Minutes of Exercise per Session: Patient refused  Stress: Stress Concern Present  . Feeling of Stress : Rather much  Social Connections: Somewhat Isolated  . Frequency of Communication with Friends and Family: More than three times a week  . Frequency of Social Gatherings with Friends and Family: Twice a week  . Attends Religious Services: 1 to 4 times per year  . Active Member of Clubs or Organizations: No  . Attends Archivist Meetings: Never  . Marital Status: Widowed     Family History: The  patient's family history includes Alzheimer's disease in her mother; Diabetes in her father and mother; Heart disease in her brother and mother; Hypertension in her father. There is no history of Colon cancer.  ROS:   Please see the history of present illness.    All other systems reviewed and are negative.  EKGs/Labs/Other Studies Reviewed:    EKG:  EKG is not ordered today  Recent Labs: 03/27/2019: ALT 13 04/28/2019: Magnesium 1.7 07/13/2019: BUN 11; Creatinine, Ser 0.77; Potassium 3.9; Sodium 134; TSH 19.298 08/02/2019: Hemoglobin 10.4; Platelets 322   Recent Lipid Panel    Component Value Date/Time   CHOL 136 02/21/2019 0018   TRIG 145 02/21/2019 0018   HDL 40 (L) 02/21/2019 0018   CHOLHDL 3.4 02/21/2019 0018   VLDL 29 02/21/2019 0018   LDLCALC 67 02/21/2019 0018    Physical Exam:    VS:  BP (!) 182/60   Pulse 68   Ht 5\' 4"  (1.626 m)   Wt 143 lb (64.9 kg)   SpO2 99%   BMI 24.55 kg/m  , BMI Body mass index is 24.55 kg/m. GENERAL:  Well appearing HEENT: Pupils equal round and reactive, fundi not visualized, oral mucosa unremarkable NECK:  No jugular venous distention, waveform within normal limits,  carotid upstroke brisk and symmetric, +R carotid bruits, no thyromegaly LYMPHATICS:  No cervical adenopathy LUNGS:  Clear to auscultation bilaterally HEART:  RRR.  PMI not displaced or sustained,S1 and S2 within normal limits, no S3, no S4, no clicks, no rubs, II/VI systolic murmur at the LUSB ABD:  Flat, positive bowel sounds normal in frequency in pitch, no bruits, no rebound, no guarding, no midline pulsatile mass, no hepatomegaly, no splenomegaly EXT:  2 plus pulses throughout, no edema, no cyanosis no clubbing SKIN:  No rashes no nodules NEURO:  Cranial nerves II through XII grossly intact, motor grossly intact throughout PSYCH:  Cognitively intact, oriented to person place and time   ASSESSMENT:    1. 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 AF (paroxysmal atrial fibrillation) ([place])   3. Hypertension, unspecified type     PLAN:    # Essential hypertension: Ms. Hunnell blood pressure is very poorly controlled. She struggles with intolerance to multiple medications. Most caused GI upset. Spironolactone as prescribed and is coming from her pharmacy but she reportedly stopped taking it. I have asked her to bring all of her medications to her follow-up appointment. Continue carvedilol, losartan, and Imdur. We will add a clonidine patch 0.1 mg weekly. I suspect that the dose will need to be increased. However given her intolerance we will start low. Losartan could also be switched to irbesartan or olmesartan which may be more effective.  Chlorthalidone could also be resumed, though she complains of nocturia and had some hyponatremia. We'll check renal artery Dopplers. She would like to enroll in the [facility] program.  She will be safe to exercise once her BP is better controlled. We also stressed the importance of limiting sodium intake.  # PAF:  She remains in sinus rhythm since her cardioversion in September. Continue amiodarone, Eliquis, and  carvedilol.  # CAD:  Medically managed 50% LAD disease. Continue carvedilol and pravastatin. She is not on aspirin given that she is on Eliquis.   Disposition:    FU with MD/PharmD in 1 month with PharmD.  F/u with Kalisha Keadle C. Oval Linsey, MD, [facility] in 4 months.    Medication Adjustments/Labs and Tests Ordered: Current medicines are  reviewed at length with the patient today.  Concerns regarding medicines are outlined above.  Orders Placed This Encounter  Procedures  . US RENAL ARTERY DUPLEX COMPLETE  . Cantril's Ladder Assessment   Meds ordered this encounter  Medications  . cloNIDine (CATAPRES - DOSED IN MG/24 HR) 0.1 mg/24hr patch    Sig: Place 1 patch (0.1 mg total) onto the skin once a week.    Dispense:  4 patch    Refill:  12    D/C ORAL CLONIDINE, AWARE SHE HAD NAUSEA     Signed, Skeet Latch, MD  08/16/2019 3:43 PM    [facility]

## 2019-08-16 NOTE — Telephone Encounter (Signed)
Charmella from [facility] called to state that the patients Renal test cannot be done at [facility] but instead has to go through the Vascular department at [facility]. Appointment has been cancelled.

## 2019-08-16 NOTE — Telephone Encounter (Addendum)
CSW received consult to reach out to patient regarding medication cost concerns with Eliquis and [place].  CSW called pt to discuss possible assistance options.  Pt reports paying over $100/month for each of the medications.  Pt gets about $1,300/month from [facility] and makes about $1,000 a month from her part time job.  Pt makes too much to qualify for Extra Help program but might be eligible for patient assistance programs through the manufacturer.    CSW mailing [place] application to patient to help with Eliquis and was able to find patient assistance program for [employer] which patient appears to qualify for based off of income requirements.  CSW explained that she will need to complete patient portion and then bring applications to her prescribing physicians office to complete and submit for her.  CSW will continue to follow and assist as needed  Jorge Ny, [facility] Desk#: [phone] Cell#: [phone]

## 2019-08-16 NOTE — Patient Instructions (Addendum)
Medication Instructions:  START CLONIDINE 0.1 MG PATCH WEEKLY  STOP CLONIDINE PILLS    Labwork: NONE   Testing/Procedures: Your physician has requested that you have a renal artery duplex. During this test, an ultrasound is used to evaluate blood flow to the kidneys. Allow one hour for this exam. Do not eat after midnight the day before and avoid carbonated beverages. Take your medications as you usually do. AT [place]: Your physician recommends that you schedule a follow-up appointment in: Elida will receive a phone call from the [facility] exercise and nutrition program to schedule an initial assessment.  Special Instructions:  MONITOR YOUR BLOOD PRESSURE TWICE A DAY, LOG IN BOOK PROVIDED. BRING BOOK AND BLOOD PRESSURE MACHINE TO FOLLOW UP   BRING YOUR MEDICATIONS TO YOUR FOLLOW UP VISIT IN 1 MONTH 09/18/2019 AT 2:00 PM    DASH Eating Plan DASH stands for "Dietary Approaches to Stop Hypertension." The DASH eating plan is a healthy eating plan that has been shown to reduce high blood pressure (hypertension). It may also reduce your risk for type 2 diabetes, heart disease, and stroke. The DASH eating plan may also help with weight loss. What are tips for following this plan?  General guidelines  Avoid eating more than 2,300 mg (milligrams) of salt (sodium) a day. If you have hypertension, you may need to reduce your sodium intake to 1,500 mg a day.  Limit alcohol intake to no more than 1 drink a day for nonpregnant women and 2 drinks a day for men. One drink equals 12 oz of beer, 5 oz of wine, or 1 oz of hard liquor.  Work with your health care provider to maintain a healthy body weight or to lose weight. Ask what an ideal weight is for you.  Get at least 30 minutes of exercise that causes your heart to beat faster (aerobic exercise) most days of the week. Activities may include walking, swimming, or biking.  Work with your health care provider or  diet and nutrition specialist (dietitian) to adjust your eating plan to your individual calorie needs. Reading food labels   Check food labels for the amount of sodium per serving. Choose foods with less than 5 percent of the Daily Value of sodium. Generally, foods with less than 300 mg of sodium per serving fit into this eating plan.  To find whole grains, look for the word "whole" as the first word in the ingredient list. Shopping  Buy products labeled as "low-sodium" or "no salt added."  Buy fresh foods. Avoid canned foods and premade or frozen meals. Cooking  Avoid adding salt when cooking. Use salt-free seasonings or herbs instead of table salt or sea salt. Check with your health care provider or pharmacist before using salt substitutes.  Do not fry foods. Cook foods using healthy methods such as baking, boiling, grilling, and broiling instead.  Cook with heart-healthy oils, such as olive, canola, soybean, or sunflower oil. Meal planning  Eat a balanced diet that includes: ? 5 or more servings of fruits and vegetables each day. At each meal, try to fill half of your plate with fruits and vegetables. ? Up to 6-8 servings of whole grains each day. ? Less than 6 oz of lean meat, poultry, or fish each day. A 3-oz serving of meat is about the same size as a deck of cards. One egg equals 1 oz. ? 2 servings of low-fat dairy each  day. ? A serving of nuts, seeds, or beans 5 times each week. ? Heart-healthy fats. Healthy fats called Omega-3 fatty acids are found in foods such as flaxseeds and coldwater fish, like sardines, salmon, and mackerel.  Limit how much you eat of the following: ? Canned or prepackaged foods. ? Food that is high in trans fat, such as fried foods. ? Food that is high in saturated fat, such as fatty meat. ? Sweets, desserts, sugary drinks, and other foods with added sugar. ? Full-fat dairy products.  Do not salt foods before eating.  Try to eat at least 2  vegetarian meals each week.  Eat more home-cooked food and less restaurant, buffet, and fast food.  When eating at a restaurant, ask that your food be prepared with less salt or no salt, if possible. What foods are recommended? The items listed may not be a complete list. Talk with your dietitian about what dietary choices are best for you. Grains Whole-grain or whole-wheat bread. Whole-grain or whole-wheat pasta. Brown rice. [place]. Bulgur. Whole-grain and low-sodium cereals. Pita bread. Low-fat, low-sodium crackers. Whole-wheat flour tortillas. Vegetables Fresh or frozen vegetables (raw, steamed, roasted, or grilled). Low-sodium or reduced-sodium tomato and vegetable juice. Low-sodium or reduced-sodium tomato sauce and tomato paste. Low-sodium or reduced-sodium canned vegetables. Fruits All fresh, dried, or frozen fruit. Canned fruit in natural juice (without added sugar). Meat and other protein foods Skinless chicken or [place]. Ground chicken or [place]. Pork with fat trimmed off. Fish and seafood. Egg whites. Dried beans, peas, or lentils. Unsalted nuts, nut butters, and seeds. Unsalted canned beans. Lean cuts of beef with fat trimmed off. Low-sodium, lean deli meat. Dairy Low-fat (1%) or fat-free (skim) milk. Fat-free, low-fat, or reduced-fat cheeses. Nonfat, low-sodium ricotta or cottage cheese. Low-fat or nonfat yogurt. Low-fat, low-sodium cheese. Fats and oils Soft margarine without trans fats. Vegetable oil. Low-fat, reduced-fat, or light mayonnaise and salad dressings (reduced-sodium). Canola, safflower, olive, soybean, and sunflower oils. Avocado. Seasoning and other foods Herbs. Spices. Seasoning mixes without salt. Unsalted popcorn and pretzels. Fat-free sweets. What foods are not recommended? The items listed may not be a complete list. Talk with your dietitian about what dietary choices are best for you. Grains Baked goods made with fat, such as croissants, muffins, or  some breads. Dry pasta or rice meal packs. Vegetables Creamed or fried vegetables. Vegetables in a cheese sauce. Regular canned vegetables (not low-sodium or reduced-sodium). Regular canned tomato sauce and paste (not low-sodium or reduced-sodium). Regular tomato and vegetable juice (not low-sodium or reduced-sodium). Angie Fava. Olives. Fruits Canned fruit in a light or heavy syrup. Fried fruit. Fruit in cream or butter sauce. Meat and other protein foods Fatty cuts of meat. Ribs. Fried meat. Berniece Salines. Sausage. Bologna and other processed lunch meats. Salami. Fatback. Hotdogs. Bratwurst. Salted nuts and seeds. Canned beans with added salt. Canned or smoked fish. Whole eggs or egg yolks. Chicken or [place] with skin. Dairy Whole or 2% milk, cream, and half-and-half. Whole or full-fat cream cheese. Whole-fat or sweetened yogurt. Full-fat cheese. Nondairy creamers. Whipped toppings. Processed cheese and cheese spreads. Fats and oils Butter. Stick margarine. Lard. Shortening. Ghee. Bacon fat. Tropical oils, such as coconut, palm kernel, or palm oil. Seasoning and other foods Salted popcorn and pretzels. Onion salt, garlic salt, seasoned salt, table salt, and sea salt. Worcestershire sauce. Tartar sauce. Barbecue sauce. Teriyaki sauce. Soy sauce, including reduced-sodium. Steak sauce. Canned and packaged gravies. Fish sauce. Oyster sauce. Cocktail sauce. Horseradish that you find on the  shelf. Ketchup. Mustard. Meat flavorings and tenderizers. Bouillon cubes. Hot sauce and Tabasco sauce. Premade or packaged marinades. Premade or packaged taco seasonings. Relishes. Regular salad dressings. Where to find more information:  National Heart, Lung, and [place]: https://wilson-eaton.com/  American Heart Association: www.heart.org Summary  The DASH eating plan is a healthy eating plan that has been shown to reduce high blood pressure (hypertension). It may also reduce your risk for type 2 diabetes, heart disease,  and stroke.  With the DASH eating plan, you should limit salt (sodium) intake to 2,300 mg a day. If you have hypertension, you may need to reduce your sodium intake to 1,500 mg a day.  When on the DASH eating plan, aim to eat more fresh fruits and vegetables, whole grains, lean proteins, low-fat dairy, and heart-healthy fats.  Work with your health care provider or diet and nutrition specialist (dietitian) to adjust your eating plan to your individual calorie needs. This information is not intended to replace advice given to you by your health care provider. Make sure you discuss any questions you have with your health care provider. Document Released: 05/07/2011 Document Revised: 04/30/2017 Document Reviewed: 05/11/2016 Elsevier Patient Education  2020 [employer].

## 2019-08-16 NOTE — Telephone Encounter (Signed)
Routed to primary nurse and scheduling to arrange at appropriate location.

## 2019-08-16 NOTE — Telephone Encounter (Signed)
New Message  Pt c/o medication issue:  1. Name of Medication: cloNIDine (CATAPRES - DOSED IN MG/24 HR) 0.1 mg/24hr patch  2. How are you currently taking this medication (dosage and times per day)? N/A  3. Are you having a reaction (difficulty breathing--STAT)? No, but patient had a reaction to the pills.   4. What is your medication issue? Patient has taken the clonidine pills and had a reaction to it. Patient's daughter wants to know if the patches will do the same. Please give patient's daughter a call back to discuss.

## 2019-08-17 ENCOUNTER — Telehealth: Payer: Self-pay | Admitting: Cardiovascular Disease

## 2019-08-17 DIAGNOSIS — I150 Renovascular hypertension: Secondary | ICD-10-CM

## 2019-08-17 NOTE — Telephone Encounter (Signed)
Spoke to patient message was sent to [place].Advised she is out of office today.We will call back after she reviews message.

## 2019-08-17 NOTE — Telephone Encounter (Signed)
Spoke with patient and she has not been taking the Clonidine When she tried the oral Clonidine it made her nauseated and "almost pass out"  Will forward to Dr Oval Linsey for review

## 2019-08-17 NOTE — Telephone Encounter (Signed)
Returned call to patient she stated she cannot take Clonidine causes nausea and she blacks out after taking.She wants Dr.Empire to prescribe something else.I will send message to [place] for advice.

## 2019-08-17 NOTE — Telephone Encounter (Signed)
New message   Patient states that she is allergic to this medication   cloNIDine (CATAPRES - DOSED IN MG/24 HR) 0.1 mg/24hr patch   Please call to discuss.

## 2019-08-17 NOTE — Telephone Encounter (Signed)
Patient returned call to [place].

## 2019-08-18 ENCOUNTER — Emergency Department (HOSPITAL_COMMUNITY)
Admission: EM | Admit: 2019-08-18 | Discharge: 2019-08-18 | Disposition: A | Discharge status: Home / Self Care | Payer: Medicare Other | Attending: Emergency Medicine | Admitting: Emergency Medicine

## 2019-08-18 ENCOUNTER — Other Ambulatory Visit: Payer: Self-pay

## 2019-08-18 ENCOUNTER — Encounter (HOSPITAL_COMMUNITY): Payer: Self-pay | Admitting: Emergency Medicine

## 2019-08-18 DIAGNOSIS — R531 Weakness: Secondary | ICD-10-CM | POA: Diagnosis not present

## 2019-08-18 DIAGNOSIS — Z7901 Long term (current) use of anticoagulants: Secondary | ICD-10-CM | POA: Diagnosis not present

## 2019-08-18 DIAGNOSIS — Z7984 Long term (current) use of oral hypoglycemic drugs: Secondary | ICD-10-CM | POA: Diagnosis not present

## 2019-08-18 DIAGNOSIS — E119 Type 2 diabetes mellitus without complications: Secondary | ICD-10-CM | POA: Insufficient documentation

## 2019-08-18 DIAGNOSIS — I4891 Unspecified atrial fibrillation: Secondary | ICD-10-CM | POA: Insufficient documentation

## 2019-08-18 DIAGNOSIS — Z79899 Other long term (current) drug therapy: Secondary | ICD-10-CM | POA: Insufficient documentation

## 2019-08-18 DIAGNOSIS — I10 Essential (primary) hypertension: Secondary | ICD-10-CM | POA: Diagnosis present

## 2019-08-18 LAB — CBC WITH DIFFERENTIAL/PLATELET
Abs Immature Granulocytes: 0.03 10*3/uL (ref 0.00–0.07)
Basophils Absolute: 0 10*3/uL (ref 0.0–0.1)
Basophils Relative: 0 %
Eosinophils Absolute: 0.1 10*3/uL (ref 0.0–0.5)
Eosinophils Relative: 1 %
HCT: 33 % — ABNORMAL LOW (ref 36.0–46.0)
Hemoglobin: 10.7 g/dL — ABNORMAL LOW (ref 12.0–15.0)
Immature Granulocytes: 0 %
Lymphocytes Relative: 18 %
Lymphs Abs: 1.5 10*3/uL (ref 0.7–4.0)
MCH: 30.3 pg (ref 26.0–34.0)
MCHC: 32.4 g/dL (ref 30.0–36.0)
MCV: 93.5 fL (ref 80.0–100.0)
Monocytes Absolute: 0.6 10*3/uL (ref 0.1–1.0)
Monocytes Relative: 7 %
Neutro Abs: 6.3 10*3/uL (ref 1.7–7.7)
Neutrophils Relative %: 74 %
Platelets: 283 10*3/uL (ref 150–400)
RBC: 3.53 MIL/uL — ABNORMAL LOW (ref 3.87–5.11)
RDW: 14 % (ref 11.5–15.5)
WBC: 8.6 10*3/uL (ref 4.0–10.5)
nRBC: 0 % (ref 0.0–0.2)

## 2019-08-18 LAB — COMPREHENSIVE METABOLIC PANEL
ALT: 14 U/L (ref 0–44)
AST: 16 U/L (ref 15–41)
Albumin: 3.9 g/dL (ref 3.5–5.0)
Alkaline Phosphatase: 46 U/L (ref 38–126)
Anion gap: 11 (ref 5–15)
BUN: 13 mg/dL (ref 8–23)
CO2: 25 mmol/L (ref 22–32)
Calcium: 8.9 mg/dL (ref 8.9–10.3)
Chloride: 93 mmol/L — ABNORMAL LOW (ref 98–111)
Creatinine, Ser: 0.74 mg/dL (ref 0.44–1.00)
GFR calc Af Amer: >60 mL/min (ref >60)
GFR calc non Af Amer: >60 mL/min (ref >60)
Glucose, Bld: 140 mg/dL — ABNORMAL HIGH (ref 70–99)
Potassium: 4.1 mmol/L (ref 3.5–5.1)
Sodium: 129 mmol/L — ABNORMAL LOW (ref 135–145)
Total Bilirubin: 0.5 mg/dL (ref 0.3–1.2)
Total Protein: 7 g/dL (ref 6.5–8.1)

## 2019-08-18 MED ORDER — LABETALOL HCL 5 MG/ML IV SOLN
20.0000 mg | Freq: Once | INTRAVENOUS | Status: AC
  Administered 2019-08-18: 20 mg via INTRAVENOUS
  Filled 2019-08-18: qty 4

## 2019-08-18 NOTE — ED Provider Notes (Signed)
[name] Note   CSN: [id number] Arrival date & time: 08/18/19  1408     History Chief Complaint  Patient presents with  . Hypertension    Amber Herman is a 74 y.o. female.  Pt here for htn  Poorly controled, mild weakness  The history is provided by the patient. No language interpreter was used.  Hypertension This is a recurrent problem. The current episode started more than 2 days ago. The problem occurs constantly. The problem has not changed since onset.Pertinent negatives include no chest pain, no abdominal pain and no headaches. Nothing aggravates the symptoms. She has tried nothing for the symptoms. The treatment provided no relief.       Past Medical History:  Diagnosis Date  . Allergic rhinitis   . Anemia   . Atrial fibrillation ([place])    Diagnosed 2013  . DM (diabetes mellitus), type 2 ([place])   . GERD (gastroesophageal reflux disease)   . Hyperlipidemia   . Hypertension   . Nephrolithiasis   . Transient ischemic attack (TIA)   . Vitamin D deficiency     Patient Active Problem List   Diagnosis Date Noted  . GERD (gastroesophageal reflux disease)   . Flatulence   . Change in bowel function   . Chest pain 03/26/2019  . Persistent atrial fibrillation ([place])   . Demand ischemia ([place])   . Atrial fibrillation with RVR ([place]) 02/20/2019  . NSTEMI (non-ST elevated myocardial infarction) ([place]) 11/02/2017  . AF (paroxysmal atrial fibrillation) ([place]) 02/27/2017  . Pyelonephritis 02/26/2017  . TIA (transient ischemic attack) 06/29/2014  . Long term (current) use of anticoagulants 11/30/2011  . Anemia 11/26/2011  . Atrial fibrillation with rapid ventricular response ([place]) 11/25/2011  . Hyponatremia 11/25/2011  . Nausea & vomiting 11/25/2011  . Diarrhea 11/25/2011  . Hypomagnesemia 11/25/2011  . Diabetes mellitus, type II ([place]) 11/25/2011  . Hypertension 11/25/2011    Past Surgical History:  Procedure Laterality Date  .  CARDIOVERSION N/A 02/22/2019   Procedure: CARDIOVERSION;  Surgeon: Donato Heinz, MD;  Location: [facility] ENDOSCOPY;  Service: Endoscopy;  Laterality: N/A;  . CHOLECYSTECTOMY    . COLONOSCOPY     normal per patient, more than 10 years ago  . LEFT HEART CATH AND CORONARY ANGIOGRAPHY N/A 02/21/2019   Procedure: LEFT HEART CATH AND CORONARY ANGIOGRAPHY;  Surgeon: [name], MD;  Location: [facility] CV LAB;  Service: Cardiovascular;  Laterality: N/A;  . TEE WITHOUT CARDIOVERSION N/A 02/22/2019   Procedure: TRANSESOPHAGEAL ECHOCARDIOGRAM (TEE);  Surgeon: Donato Heinz, MD;  Location: [facility] ENDOSCOPY;  Service: Endoscopy;  Laterality: N/A;     OB History    Gravida  4   Para  3   Term  3   Preterm      AB  1   Living  3     SAB  1   TAB      Ectopic      Multiple      Live Births              Family History  Problem Relation Age of Onset  . Hypertension Father   . Diabetes Father   . Heart disease Mother   . Alzheimer's disease Mother   . Diabetes Mother   . Heart disease Brother   . Colon cancer Neg Hx     Social History   Tobacco Use  . Smoking status: Never Smoker  . Smokeless tobacco: Never Used  Substance  Use Topics  . Alcohol use: No  . Drug use: No    Home Medications Prior to Admission medications   Medication Sig Start Date End Date Taking? Authorizing Provider  acetaminophen (TYLENOL) 325 MG tablet Take 2 tablets (650 mg total) by mouth every 8 (eight) hours as needed. 11/19/17   Mesner, Corene Cornea, MD  ALPRAZolam Duanne Moron) 0.25 MG tablet Take 1 tablet (0.25 mg total) by mouth 2 (two) times daily as needed for anxiety or sleep. 03/27/19   Roxan Hockey, MD  amiodarone (PACERONE) 200 MG tablet TAKE 1 TABLET BY MOUTH ONCE DAILY. 05/18/19   Satira Sark, MD  apixaban (ELIQUIS) 5 MG TABS tablet Take 1 tablet (5 mg total) by mouth 2 (two) times daily. 11/03/17   Manuella Ghazi, Pratik D, DO  busPIRone (BUSPAR) 5 MG tablet Take 5 mg by mouth.  07/13/19 07/12/20  [provider]  carvedilol (COREG) 12.5 MG tablet TAKE 1 TABLET BY MOUTH TWICE DAILY WITH A MEAL. 05/18/19   Satira Sark, MD  cloNIDine (CATAPRES - DOSED IN MG/24 HR) 0.1 mg/24hr patch Place 1 patch (0.1 mg total) onto the skin once a week. 08/16/19   Skeet Latch, MD  Dulaglutide (TRULICITY [place]) Inject 16 Units into the skin.    [provider]  FEROSUL 325 (65 Fe) MG tablet Take 325 mg by mouth daily.  02/03/19   [provider]  isosorbide mononitrate (IMDUR) 30 MG 24 hr tablet Take 1 tablet (30 mg total) by mouth 2 (two) times daily. 06/01/19   Satira Sark, MD  losartan (COZAAR) 50 MG tablet Take 1 tablet (50 mg total) by mouth 2 (two) times daily. 04/19/19 05/31/20  Satira Sark, MD  magnesium oxide (MAG-OX) 400 (241.3 Mg) MG tablet TAKE 1 TABLET BY MOUTH ONCE DAILY. 06/14/19   Satira Sark, MD  metFORMIN (GLUCOPHAGE) 850 MG tablet Take 850 mg by mouth 2 (two) times daily with a meal.  01/16/19   [provider]  nitroGLYCERIN (NITROSTAT) 0.4 MG SL tablet Place 1 tablet (0.4 mg total) under the tongue every 5 (five) minutes as needed for chest pain (CP or SOB). 02/23/19   Shahmehdi, Valeria Batman, MD  pantoprazole (PROTONIX) 40 MG tablet Take 1 tablet (40 mg total) by mouth 2 (two) times daily before a meal. 08/02/19   Mahala Menghini, PA-C  pravastatin (PRAVACHOL) 40 MG tablet Take 40 mg by mouth at bedtime.     [provider]  spironolactone (ALDACTONE) 25 MG tablet Take 0.5 tablets (12.5 mg total) by mouth at bedtime. 07/11/19   Satira Sark, MD  TRESIBA FLEXTOUCH 200 UNIT/ML SOPN Inject 16 Units into the skin at bedtime.  02/10/17   [provider]  triamcinolone cream (KENALOG) 0.1 % Apply 1 application topically 2 (two) times daily as needed. 1 application to affected area twice daily, Mix 1:1 with Eucerin externally 12/26/18   [provider]  vitamin B-12 (CYANOCOBALAMIN) 1000 MCG tablet  Take 1,000 mcg by mouth daily.  01/16/19   [provider]  VITAMIN D PO Take 1 capsule by mouth daily.    [provider]    Allergies    Amlodipine, Angiotensin receptor blockers, Calcium channel blockers, Latex, Nicardipine hcl, Simvastatin, Clonidine, Sulfa antibiotics, Sulfacetamide sodium, and Sulfasalazine  Review of Systems   Review of Systems  Constitutional: Negative for appetite change and fatigue.  HENT: Negative for congestion, ear discharge and sinus pressure.   Eyes: Negative for discharge.  Respiratory: Negative  for cough.   Cardiovascular: Negative for chest pain.  Gastrointestinal: Negative for abdominal pain and diarrhea.  Genitourinary: Negative for frequency and hematuria.  Musculoskeletal: Negative for back pain.  Skin: Negative for rash.  Neurological: Negative for seizures and headaches.  Psychiatric/Behavioral: Negative for hallucinations.    Physical Exam Updated Vital Signs BP (!) 180/62   Pulse 61   Temp 97.7 F (36.5 C) (Oral)   Resp (!) 21   Ht 5\' 4"  (1.626 m)   Wt 65.3 kg   SpO2 98%   BMI 24.72 kg/m   Physical Exam Vitals reviewed.  Constitutional:      Appearance: She is well-developed.  HENT:     Head: Normocephalic.     Nose: Nose normal.  Eyes:     General: No scleral icterus.    Conjunctiva/sclera: Conjunctivae normal.  Neck:     Thyroid: No thyromegaly.  Cardiovascular:     Rate and Rhythm: Normal rate and regular rhythm.     Heart sounds: No murmur. No friction rub. No gallop.   Pulmonary:     Breath sounds: No stridor. No wheezing or rales.  Chest:     Chest wall: No tenderness.  Abdominal:     General: There is no distension.     Tenderness: There is no abdominal tenderness. There is no rebound.  Musculoskeletal:        General: Normal range of motion.     Cervical back: Neck supple.  Lymphadenopathy:     Cervical: No cervical adenopathy.  Skin:    Findings: No erythema or rash.  Neurological:      Mental Status: She is alert and oriented to person, place, and time.     Motor: No abnormal muscle tone.     Coordination: Coordination normal.  Psychiatric:        Behavior: Behavior normal.     ED Results / Procedures / Treatments   Labs (all labs ordered are listed, but only abnormal results are displayed) Labs Reviewed  CBC WITH DIFFERENTIAL/PLATELET - Abnormal; Notable for the following components:      Result Value   RBC 3.53 (*)    Hemoglobin 10.7 (*)    HCT 33.0 (*)    All other components within normal limits  COMPREHENSIVE METABOLIC PANEL - Abnormal; Notable for the following components:   Sodium 129 (*)    Chloride 93 (*)    Glucose, Bld 140 (*)    All other components within normal limits    EKG None  Radiology No results found.  Procedures Procedures (including critical care time)  Medications Ordered in ED Medications  labetalol (NORMODYNE) injection 20 mg (20 mg Intravenous Given 08/18/19 1542)    ED Course  I have reviewed the triage vital signs and the nursing notes.  Pertinent labs & imaging results that were available during my care of the patient were reviewed by me and considered in my medical decision making (see chart for details). CRITICAL CARE Performed by: Milton Ferguson Total critical care time: 35 minutes Critical care time was exclusive of separately billable procedures and treating other patients. Critical care was necessary to treat or prevent imminent or life-threatening deterioration. Critical care was time spent personally by me on the following activities: development of treatment plan with patient and/or surrogate as well as nursing, discussions with consultants, evaluation of patient's response to treatment, examination of patient, obtaining history from patient or surrogate, ordering and performing treatments and interventions, ordering and review of laboratory studies,  ordering and review of radiographic studies, pulse oximetry and  re-evaluation of patient's condition.    MDM Rules/Calculators/A&P                     Patient with poorly controlled blood pressure.  She improved with labetalol.  We will increase her Coreg from 12 and half milligrams twice a day and make it 25 mg twice a day.  She will follow-up with her PCP this week Final Clinical Impression(s) / ED Diagnoses Final diagnoses:  Essential hypertension    Rx / DC Orders ED Discharge Orders    None       Milton Ferguson, MD 08/18/19 1745

## 2019-08-18 NOTE — Telephone Encounter (Addendum)
Patient currently in ED   Patient also need renal duplex at [facility], see 3/17 phone note

## 2019-08-18 NOTE — Discharge Instructions (Addendum)
Increase your Coreg so you are taking 25 mg twice a day.  This is twice as much as you have been taking.  Follow-up with your doctor next week for recheck

## 2019-08-18 NOTE — Telephone Encounter (Signed)
Pt c/o medication issue:  1. Name of Medication:   cloNIDine (CATAPRES - DOSED IN MG/24 HR) 0.1 mg/24hr patch   2. How are you currently taking this medication (dosage and times per day)? As directed  3. Are you having a reaction (difficulty breathing--STAT)? yes  4. What is your medication issue? Patient put patch on and is already throwing up. Patient's daughter told her to take it off and is now requesting a call back to discuss further options.

## 2019-08-18 NOTE — Telephone Encounter (Signed)
Attempt to return call. No answer and unable to leave VM

## 2019-08-18 NOTE — Telephone Encounter (Signed)
Spoke with scheduling and they do not do renal duplex at [facility] placed and sent to scheduling to arrange at [facility]

## 2019-08-18 NOTE — Telephone Encounter (Signed)
Talked to daughter  Amber Herman). Clonidine tablets caused increased fatigue and GI issues in the past.   Recommendation:  Okay to try patches. Less ADR expected d/t slow release and avoiding peak concentration in comparison to oral dose.  May remove patch and call back if patient experienced new adverse reaction with patch.

## 2019-08-18 NOTE — ED Triage Notes (Signed)
Pcp sent pt to hypertension clinic where a clonidine patch was put on pt.  Pt reports she has been nauseated and swimmy headed since patch was placed.  Pt removed the patch.

## 2019-08-18 NOTE — Telephone Encounter (Signed)
Received call back from patient and [facility])- states she put first clonidine patch on this morning at 10:15 AM and patient called her at 1240 c/o of vomiting, low blood sugar and elevated BP.   She states she is on the way to the ER now.   States this is very similar to what happened with the PO clonidine.   Advised would make Dr. Oval Linsey aware and pending ER visit/outcome will need to get scheduled for OV or discuss alternatives.  She verbalized understanding.

## 2019-08-18 NOTE — Telephone Encounter (Signed)
OK.  Stop Clonidine patch and start doxazosin 2mg  qhs.

## 2019-08-21 ENCOUNTER — Ambulatory Visit (HOSPITAL_COMMUNITY): Admission: RE | Admit: 2019-08-21 | Payer: Medicare Other | Source: Ambulatory Visit

## 2019-08-21 ENCOUNTER — Telehealth: Payer: Self-pay

## 2019-08-21 ENCOUNTER — Telehealth: Payer: Self-pay | Admitting: Cardiology

## 2019-08-21 ENCOUNTER — Telehealth: Payer: Self-pay | Admitting: Gastroenterology

## 2019-08-21 MED ORDER — CARVEDILOL 25 MG PO TABS: 25.0000 mg | ORAL_TABLET | Freq: Two times a day (BID) | ORAL | 1 refills | Status: DC

## 2019-08-21 NOTE — Telephone Encounter (Signed)
Pt c/o medication issue:  1. Name of Medication: Clonidine -allergic to it. Patient continues to be nauseated   2. How are you currently taking this medication (dosage and times per day)? No   3. Are you having a reaction (difficulty breathing--STAT)?   4. What is your medication issue? Patient went to the ER over the weekend with medication issues and elevated BP

## 2019-08-21 NOTE — Telephone Encounter (Signed)
Medication sent to pharmacy - pt aware

## 2019-08-21 NOTE — Telephone Encounter (Signed)
PATIENT WENT TO [place] THINKING SHE WAS SUPPOSED TO HAVE A ULTRASOUND TODAY   PLEASE CALL HER

## 2019-08-21 NOTE — Telephone Encounter (Signed)
LM requesting call back to discuss referral for  virtual fitness class from the HTN clinic.

## 2019-08-21 NOTE — Telephone Encounter (Signed)
Pt went to ED for blood pressure - stopped clonidine and Coreg increased 25 mg bid but pt gets her medication pre packaged and didn't have enough to take 25 mg bid so she hasn't started the increase yet. Pt requesting we send coreg 25 mg bid to pharmacy - will forward to [facility] if ok

## 2019-08-21 NOTE — Telephone Encounter (Signed)
Yes, please send refill.

## 2019-08-21 NOTE — Telephone Encounter (Signed)
We did not order an [place] on patient. Looks like a Dr. Oval Linsey did?

## 2019-08-22 ENCOUNTER — Telehealth: Payer: Self-pay | Admitting: Cardiovascular Disease

## 2019-08-22 ENCOUNTER — Telehealth: Payer: Self-pay

## 2019-08-22 NOTE — Telephone Encounter (Signed)
Patient is requesting her Renal test be done at [facility], because it is more convenient for her.

## 2019-08-22 NOTE — Telephone Encounter (Signed)
Call from patient to discuss PREP. Distance is a barrier. Unable to do virtual work outs due to lack of internet.  States has been doing some walking but meds make her nauseated so she has been walking as much as she likes to.  Is interested in joining [facility] that is closer to her believes her insurance may cover it.  Encouraged her to reach out to [facility] and discuss membership options. She said she will.

## 2019-08-22 NOTE — Telephone Encounter (Signed)
Advised patient received call from [facility] would need to reschedule renal duplex as they do not do them Message sent to scheduling to arrange

## 2019-08-23 ENCOUNTER — Telehealth: Payer: Self-pay | Admitting: Cardiovascular Disease

## 2019-08-23 NOTE — Telephone Encounter (Signed)
Follow Up   Patient's daughter Sheral Flow is returning call. Transferred to [place].

## 2019-08-23 NOTE — Telephone Encounter (Signed)
   Pt would like to speak with a Nurse she said she has question regarding her upcoming Renal test.  Please call

## 2019-08-23 NOTE — Telephone Encounter (Signed)
Spoke with daughter regarding renal duplex. Message sent to scheduling team to reschedule for morning appointment since NPO after midnight

## 2019-08-23 NOTE — Telephone Encounter (Signed)
OK great

## 2019-08-23 NOTE — Telephone Encounter (Signed)
Per patient since increasing Coreg to 25 mg twice a day after ED visit blood pressure running better. Last 2 readings today 120's/60 HR 60 She will continue current regimen and keep doppler Monday as scheduled

## 2019-08-23 NOTE — Telephone Encounter (Signed)
Spoke with patient and she asked that I reach out to daughter regarding renal duplex Called daughter Sheral Flow at [phone] as requested, no voicemail. Will try again

## 2019-08-23 NOTE — Telephone Encounter (Signed)
Line busy

## 2019-08-23 NOTE — Telephone Encounter (Signed)
Left message for patient to call and schedule renal doppler ordered by Dr. Oval Linsey

## 2019-08-28 ENCOUNTER — Telehealth: Payer: Self-pay | Admitting: Licensed Clinical Social Worker

## 2019-08-28 ENCOUNTER — Encounter (HOSPITAL_COMMUNITY): Payer: Medicare Other

## 2019-08-28 NOTE — Telephone Encounter (Signed)
CSW called pt to check in regarding patient assistance applications that we discussed on 3/17.  Pt confirms she has gotten the applications but has not started them yet.  CSW encouraged her to work on them ASAP and turn back in to her providers so she can hopefully get assistance with prescription costs.  Pt expressed understanding and plans to get those turned in as soon as she can.  CSW will continue to follow and assist as needed  Jorge Ny, [facility] Desk#: [phone] Cell#: [phone]

## 2019-09-01 IMAGING — NM NM MYOCAR MULTI W/SPECT W/WALL MOTION & EF
2 series · 12 of 12 positions shown · non-contrast
Comparison: none

[Series 1: rest · 6.51mm/px · 6 of 64 frames shown]
[frame 6/64]
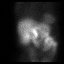
[frame 16/64]
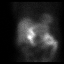
[frame 27/64]
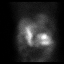
[frame 38/64]
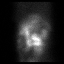
[frame 48/64]
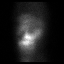
[frame 59/64]
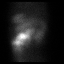

[Series 3: stress gated - perfusion · 6.51mm/px · 6 of 64 frames shown]
[frame 6/64]
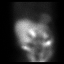
[frame 16/64]
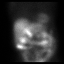
[frame 27/64]
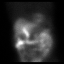
[frame 38/64]
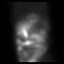
[frame 48/64]
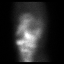
[frame 59/64]
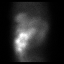

[12 of 12 positions shown; findings below may reference images not displayed]

Canned report from images found in remote index.

Refer to host system for actual result text.

## 2019-09-08 ENCOUNTER — Ambulatory Visit (HOSPITAL_COMMUNITY)
Admission: RE | Admit: 2019-09-08 | Discharge: 2019-09-08 | Disposition: A | Discharge status: Home / Self Care | Payer: Medicare Other | Source: Ambulatory Visit | Attending: Cardiovascular Disease | Admitting: Cardiovascular Disease

## 2019-09-08 ENCOUNTER — Other Ambulatory Visit: Payer: Self-pay

## 2019-09-08 DIAGNOSIS — I150 Renovascular hypertension: Secondary | ICD-10-CM | POA: Insufficient documentation

## 2019-09-13 ENCOUNTER — Encounter: Payer: Self-pay | Admitting: Gastroenterology

## 2019-09-13 ENCOUNTER — Other Ambulatory Visit: Payer: Self-pay

## 2019-09-13 ENCOUNTER — Ambulatory Visit (INDEPENDENT_AMBULATORY_CARE_PROVIDER_SITE_OTHER): Payer: Medicare Other | Admitting: Gastroenterology

## 2019-09-13 VITALS — BP 161/63 | HR 64 | Temp 97.0°F | Ht 64.0 in | Wt 144.0 lb

## 2019-09-13 DIAGNOSIS — R1310 Dysphagia, unspecified: Secondary | ICD-10-CM | POA: Insufficient documentation

## 2019-09-13 DIAGNOSIS — K219 Gastro-esophageal reflux disease without esophagitis: Secondary | ICD-10-CM

## 2019-09-13 DIAGNOSIS — R198 Other specified symptoms and signs involving the digestive system and abdomen: Secondary | ICD-10-CM

## 2019-09-13 DIAGNOSIS — R1319 Other dysphagia: Secondary | ICD-10-CM

## 2019-09-13 NOTE — Progress Notes (Signed)
Primary Care Physician: Renee Rival, NP  Primary Gastroenterologist:  Barney Drain, MD   Chief Complaint  Patient presents with  . Gastroesophageal Reflux    f/u. On nexium and doing okay  . change in bowels    goes from diarrhea/constipation    HPI: Amber Herman is a 74 y.o. female here for follow-up.  She is seen back on August 02, 2019 for GERD.  Patient's past medical history significant for A. fib on Eliquis, hypertension, TIA, diabetes, CAD.  In September 2020 she presented with NSTEMI, A. fib with RVR and underwent cardiac catheterization September 22.  Noted to have moderate mid LAD stenosis after the origin of the second diagonal branch-favor medical therapy.  Underwent TEE/DCCV September 23.  When I saw her last she was having a lot of issues with elevated blood pressure at the time her blood pressure was 222/76.  She has been working along with a cardiologist for management.  Also having some dizziness and memory issues.  Saw neurology.MRI brain performed along with multiple labs. MRI brain showed mild parenchymal volume loss and moderate chronic small vessel ischemia but no acute abnormalities.  Labs show TSH of 19.298.  We did discuss possibility of upper endoscopy to evaluate refractory heartburn but given her hypertension was not controlled she was not considered a candidate at that time.  She was also complaining of change in bowel habits with alternating constipation and diarrhea inquired about possibility of having a colonoscopy.  We increased her pantoprazole to twice daily before meals.  Added probiotic.  Recent referral to endocrinology for thyroid and refractory high blood pressure.  Patient presents for follow up. Now on Nexium 40mg  daily. Heartburn much better. She continues to have solid food dysphagia. Pills are ok. Wants to wait on EGD given numerous pending health issues. She is being seen by Dr. Oval Linsey, had renal u/s in her office. Patient  continues to have alternating constipation and diarrhea. Has each equal amounts of the time. Wakes up at 4am sometimes to have a BM. No melena, brbpr. Patient complains of recent onset frequent urination at night. Denies dysuria. Recent A1C around 7. Taking aldactone at bedtime.   Current Outpatient Medications  Medication Sig Dispense Refill  . acetaminophen (TYLENOL) 325 MG tablet Take 2 tablets (650 mg total) by mouth every 8 (eight) hours as needed. 60 tablet 0  . amiodarone (PACERONE) 200 MG tablet TAKE 1 TABLET BY MOUTH ONCE DAILY. 28 tablet 11  . apixaban (ELIQUIS) 5 MG TABS tablet Take 1 tablet (5 mg total) by mouth 2 (two) times daily. 60 tablet 1  . carvedilol (COREG) 25 MG tablet Take 1 tablet (25 mg total) by mouth 2 (two) times daily. 180 tablet 1  . FEROSUL 325 (65 Fe) MG tablet Take 325 mg by mouth daily.     . isosorbide mononitrate (IMDUR) 30 MG 24 hr tablet Take 1 tablet (30 mg total) by mouth 2 (two) times daily. 180 tablet 2  . losartan (COZAAR) 50 MG tablet Take 1 tablet (50 mg total) by mouth 2 (two) times daily. 180 tablet 3  . magnesium oxide (MAG-OX) 400 (241.3 Mg) MG tablet TAKE 1 TABLET BY MOUTH ONCE DAILY. 28 tablet 11  . metFORMIN (GLUCOPHAGE) 850 MG tablet Take 850 mg by mouth 2 (two) times daily with a meal.     . nitroGLYCERIN (NITROSTAT) 0.4 MG SL tablet Place 1 tablet (0.4 mg total) under the tongue every 5 (five) minutes  as needed for chest pain (CP or SOB). 9 tablet 2  . pravastatin (PRAVACHOL) 40 MG tablet Take 40 mg by mouth at bedtime.     Marland Kitchen spironolactone (ALDACTONE) 25 MG tablet Take 0.5 tablets (12.5 mg total) by mouth at bedtime. 45 tablet 2  . TRESIBA FLEXTOUCH 200 UNIT/ML SOPN Inject 16 Units into the skin at bedtime.     . triamcinolone cream (KENALOG) 0.1 % Apply 1 application topically 2 (two) times daily as needed. 1 application to affected area twice daily, Mix 1:1 with Eucerin externally    . vitamin B-12 (CYANOCOBALAMIN) 1000 MCG tablet Take 1,000  mcg by mouth daily.      No current facility-administered medications for this visit.    Allergies as of 09/13/2019 - Review Complete 09/13/2019  Allergen Reaction Noted  . Amlodipine  11/25/2011  . Angiotensin receptor blockers  11/25/2011  . Calcium channel blockers  11/25/2011  . Latex Hives and Itching 02/21/2019  . Nicardipine hcl  09/18/2014  . Simvastatin  11/25/2011  . Clonidine Other (See Comments) 08/18/2019  . Sulfa antibiotics Itching and Rash 11/25/2011  . Sulfacetamide sodium Itching and Rash 09/18/2014  . Sulfasalazine Itching and Rash 11/25/2011    ROS:  General: Negative for anorexia, weight loss, fever, chills, fatigue, weakness. ENT: Negative for hoarseness, difficulty swallowing , nasal congestion. CV: Negative for chest pain, angina, palpitations, dyspnea on exertion, peripheral edema.  Respiratory: Negative for dyspnea at rest, dyspnea on exertion, cough, sputum, wheezing.  GI: See history of present illness. GU:  Negative for dysuria, hematuria, urinary incontinence, urinary frequency, nocturnal urination.  Endo: Negative for unusual weight change.    Physical Examination:   BP (!) 161/63   Pulse 64   Temp (!) 97 F (36.1 C) (Oral)   Ht 5\' 4"  (1.626 m)   Wt 144 lb (65.3 kg)   BMI 24.72 kg/m   General: Well-nourished, well-developed in no acute distress.  Eyes: No icterus. Mouth: masked Lungs: Clear to auscultation bilaterally.  Heart: Regular rate and rhythm, no murmurs rubs or gallops.  Abdomen: Bowel sounds are normal, nontender, nondistended, no hepatosplenomegaly or masses, no abdominal bruits or hernia , no rebound or guarding.   Extremities: No lower extremity edema. No clubbing or deformities. Neuro: Alert and oriented x 4   Skin: Warm and dry, no jaundice.   Psych: Alert and cooperative, normal mood and affect.  Labs:  Lab Results  Component Value Date   CREATININE 0.74 08/18/2019   BUN 13 08/18/2019   NA 129 (L) 08/18/2019   K  4.1 08/18/2019   CL 93 (L) 08/18/2019   CO2 25 08/18/2019   Lab Results  Component Value Date   ALT 14 08/18/2019   AST 16 08/18/2019   ALKPHOS 46 08/18/2019   BILITOT 0.5 08/18/2019   Lab Results  Component Value Date   WBC 8.6 08/18/2019   HGB 10.7 (L) 08/18/2019   HCT 33.0 (L) 08/18/2019   MCV 93.5 08/18/2019   PLT 283 08/18/2019   Lab Results  Component Value Date   IRON 40 (L) 08/02/2019   TIBC 308 08/02/2019   FERRITIN 33 08/02/2019   Lab Results  Component Value Date   VITAMINB12 981 (H) 07/13/2019   Lab Results  Component Value Date   FOLATE 13.4 07/13/2019     Imaging Studies: VAS US RENAL ARTERY DUPLEX  Result Date: 09/09/2019 ABDOMINAL VISCERAL Indications: Hypertension. Patient denies any abdominal pain. High Risk Factors: Hypertension, Diabetes, no history of smoking,  prior MI. Other Factors: TIA. Limitations: Air/bowel gas. Comparison Study: NA Performing Technologist: Sharlett Iles RVT  Examination Guidelines: A complete evaluation includes B-mode imaging, spectral Doppler, color Doppler, and power Doppler as needed of all accessible portions of each vessel. Bilateral testing is considered an integral part of a complete examination. Limited examinations for reoccurring indications may be performed as noted.  Duplex Findings: +--------------------+--------+--------+------+----------------------+ Mesenteric          PSV cm/sEDV cm/sPlaque       Comments        +--------------------+--------+--------+------+----------------------+ Aorta Prox             93                 1.7 cm AP x 1.7 cm TRV +--------------------+--------+--------+------+----------------------+ Aorta Distal           92                                        +--------------------+--------+--------+------+----------------------+ Celiac Artery Origin  113      14                                +--------------------+--------+--------+------+----------------------+ SMA Origin              98      10                                +--------------------+--------+--------+------+----------------------+ SMA Proximal          234      18                                +--------------------+--------+--------+------+----------------------+  +------------------+--------+--------+-------+ Right Renal ArteryPSV cm/sEDV cm/sComment +------------------+--------+--------+-------+ Origin              101      15           +------------------+--------+--------+-------+ Proximal            107      14           +------------------+--------+--------+-------+ Mid                 111      13           +------------------+--------+--------+-------+ Distal              188      18           +------------------+--------+--------+-------+ +-----------------+--------+--------+-------+ Left Renal ArteryPSV cm/sEDV cm/sComment +-----------------+--------+--------+-------+ Origin             137      21           +-----------------+--------+--------+-------+ Proximal           112      18           +-----------------+--------+--------+-------+ Mid                 94      13           +-----------------+--------+--------+-------+ Distal              59      8            +-----------------+--------+--------+-------+  Technologist observations: Abnormal dilatation of the right ureter, measuring 1.1 cm. Prominent left renal pyramids. +------------+--------+--------+----+-----------+--------+--------+----+ Right KidneyPSV cm/sEDV cm/sRI  Left KidneyPSV cm/sEDV cm/sRI   +------------+--------+--------+----+-----------+--------+--------+----+ Upper Pole  44      7       0.84Upper Pole 41      7       0.84 +------------+--------+--------+----+-----------+--------+--------+----+ Mid         38      7       0.82Mid        46      6       0.86 +------------+--------+--------+----+-----------+--------+--------+----+ Lower Pole  29      0        1.00Lower Pole 19      6       0.71 +------------+--------+--------+----+-----------+--------+--------+----+ Hilar       45      6       0.87Hilar      56      8       0.86 +------------+--------+--------+----+-----------+--------+--------+----+ +------------------+-------+------------------+-------+ Right Kidney             Left Kidney               +------------------+-------+------------------+-------+ RAR                      RAR                       +------------------+-------+------------------+-------+ RAR (manual)      2.02   RAR (manual)      1.47    +------------------+-------+------------------+-------+ Cortex                   Cortex                    +------------------+-------+------------------+-------+ Cortex thickness  0.88 mmCorex thickness   0.84 mm +------------------+-------+------------------+-------+ Kidney length (cm)12.00  Kidney length (cm)10.80   +------------------+-------+------------------+-------+  Summary: Largest Aortic Diameter: 1.7 cm  Renal:  Right: Normal size right kidney. Abnormal right Resistive Index.        Abnormal cortical thickness of right kidney. 1-59% stenosis        of the right renal artery. RRV flow present. Abnormal        dilatation of the right ureter, measuring 1.1 cm. Left:  Normal size of left kidney. Abnormal left Resistive Index.        Abnormal cortical thickness of the left kidney. No evidence        of left renal artery stenosis. LRV flow present. Prominent        left renal pyramids. Mesenteric: Normal Celiac artery and Superior Mesenteric artery findings.  Patent IVC.  *See table(s) above for measurements and observations.  Diagnosing physician: Carlyle Dolly MD  Electronically signed by Carlyle Dolly MD on 09/09/2019 at 8:51:03 AM.    Final       Impression/Plan:  74 y/o female with multiple comorbidities presenting for follow up. Her reflux is better controlled now on Nexium. She continues  have have solid food dysphagia. She continues to have alternating constipation/diarrhea with last colonoscopy being over 10 years ago. We discussed EGD/ED with colonoscopy at time of last ov but she had uncontrolled HTN so we postponed. Her BP is better managed at this point but continues to fluctuate quite a bit per patient. She has several pending appointments (including seeing endocrinology) and wants to hold for for procedures  at this time.   Patient also has chronic stable normocytic anemia with trend towards iron deficiency. Will evaluate at time of procedures when she is ready.   She will continue Nexium 40mg  daily.   We will have her come back in 3 months for follow up and schedule for EGD/ED/colonoscopy at that time if she is ready. She will call sooner if she as any questions or desires pursuing procedures sooner.

## 2019-09-13 NOTE — Patient Instructions (Addendum)
1. Continue Nexium 40mg  once daily before breakfast.  2. Please ask Dr. Domenic Polite if you can take spironolactone earlier in the day to see if this cuts back on you having frequent nighttime urination. 3. We will see you back in 3 months and if you are ready at that time, we will consider upper endoscopy and colonoscopy.  4. Please call sooner if you need anything.

## 2019-09-18 ENCOUNTER — Telehealth: Payer: Self-pay | Admitting: Cardiovascular Disease

## 2019-09-18 ENCOUNTER — Encounter: Payer: Self-pay | Admitting: Cardiology

## 2019-09-18 ENCOUNTER — Ambulatory Visit: Payer: Medicare Other

## 2019-09-18 ENCOUNTER — Telehealth: Payer: Self-pay | Admitting: Cardiology

## 2019-09-18 MED ORDER — SPIRONOLACTONE 25 MG PO TABS: 12.5000 mg | ORAL_TABLET | Freq: Every day | ORAL | 2 refills | Status: DC

## 2019-09-18 NOTE — Telephone Encounter (Signed)
Dr [facility] office calling.  Patient is currently in their office with BP of  220/80.  They are asking if the patient should come here today or be sent to ED.

## 2019-09-18 NOTE — Telephone Encounter (Signed)
Spoke with patient. Blood pressure was 220/80 at MD office today. Patient advised by Tommy Medal to take 1 whole spironolactone and tonight take her regular 1/2 tab. Prescription refill sent in to patient's preferred pharmacy so that she can take a whole tablet as her medications are prepackaged and she is unable to locate the spironolactone in the package. Patient unable to recheck BP at this time as BP machine batteries are dead.   Spoke with DOD Dr. Audie Box who advises patient come in this week for BP check and to be seen. Patient report she has an appointment on 4/29 and does not want to come in sooner. Patient is going to get batteries for her BP machine tonight and recheck BP in the morning 2 hours after her morning medications. If patient is hypertensive she will call back into the office.   Patient is asymptomatic at this time.

## 2019-09-18 NOTE — Telephone Encounter (Signed)
Patient has returned telephone call back to [facility].

## 2019-09-18 NOTE — Telephone Encounter (Signed)
Patient notified and verbalized understanding.  States she is getting a call back from [facility]tn clinic now.

## 2019-09-18 NOTE — Telephone Encounter (Signed)
Spoke with nurse with neurology office - patient is asymptomatic.  Will forward message to HTN clinic.

## 2019-09-18 NOTE — Telephone Encounter (Signed)
If patient asymptomatic, have her take 1 tablet of spironolactone when she gets home (in addition to the 1/2 tablet she takes daily) and have her rest.  She should check her BP again this evening and tomorrow morning, taking all of her regularly scheduled meds.  If still elevated in the morning, have her call the office

## 2019-09-18 NOTE — Telephone Encounter (Signed)
   Pt c/o BP issue: STAT if pt c/o blurred vision, one-sided weakness or slurred speech  1. What are your last 5 BP readings? 220/80  2. Are you having any other symptoms (ex. Dizziness, headache, blurred vision, passed out)? Denied symptoms   3. What is your BP issue? Pt said she had her BP checked today and it's at 220/80, she doesn't feel any symptoms but just wanted to know what she can do, she wanted to speak with Dr. Oval Linsey   Please call

## 2019-09-18 NOTE — Telephone Encounter (Signed)
error 

## 2019-09-19 ENCOUNTER — Telehealth: Payer: Self-pay | Admitting: Cardiovascular Disease

## 2019-09-19 NOTE — Telephone Encounter (Signed)
See previous phone note. Patient reports we do not need to return call to [facility] and she will contact Candy with an update.

## 2019-09-19 NOTE — Telephone Encounter (Signed)
Instruction were to take 1 extra spironolactone yesterday ONLY, then back to her regular dose until follow up in 9 days.  Clarification provided to [employer].

## 2019-09-19 NOTE — Telephone Encounter (Signed)
See other phone note from today. I spoke with patient who reports BP was 223/80 and 212/71 earlier today. She checked 30-40 minutes after taking AM medications and BP was 205/61.  She rechecked while on phone with me and currently BP is 215/68

## 2019-09-19 NOTE — Telephone Encounter (Signed)
I was unable to find Cherlynn June on [place]. I spoke with patient who gave me permission to speak with Candy. She reports Freida Busman is her daughter.

## 2019-09-19 NOTE — Telephone Encounter (Signed)
Patient calling back -  States that her BP is still high. 212/??

## 2019-09-19 NOTE — Telephone Encounter (Signed)
Patient should go to Emergency room for additional evaluation.

## 2019-09-19 NOTE — Telephone Encounter (Signed)
New Message    Pt c/o medication issue:  1. Name of Medication: spironolactone (ALDACTONE) 25 MG tablet  2. How are you currently taking this medication (dosage and times per day)? Pt says one tablet in the morning and a half at bedtime   3. Are you having a reaction (difficulty breathing--STAT)? No   4. What is your medication issue? Freida Busman is calling and is needing to clarify how the pt should be taking her medication    Please advise

## 2019-09-19 NOTE — Telephone Encounter (Signed)
Left message to call back  

## 2019-09-19 NOTE — Telephone Encounter (Signed)
I spoke with patient and gave her information from pharmacist.  

## 2019-09-21 MED ORDER — NITROGLYCERIN 0.4 MG SL SUBL
0.40 | SUBLINGUAL_TABLET | SUBLINGUAL | Status: DC
Start: ? — End: 2019-09-21

## 2019-09-28 ENCOUNTER — Ambulatory Visit: Payer: Medicare Other

## 2019-10-09 ENCOUNTER — Ambulatory Visit: Payer: Medicare Other | Admitting: Cardiology

## 2019-12-06 ENCOUNTER — Other Ambulatory Visit (HOSPITAL_BASED_OUTPATIENT_CLINIC_OR_DEPARTMENT_OTHER): Payer: Self-pay

## 2019-12-06 DIAGNOSIS — I10 Essential (primary) hypertension: Secondary | ICD-10-CM

## 2019-12-06 DIAGNOSIS — G4710 Hypersomnia, unspecified: Secondary | ICD-10-CM

## 2019-12-06 DIAGNOSIS — R0683 Snoring: Secondary | ICD-10-CM

## 2019-12-11 ENCOUNTER — Ambulatory Visit: Payer: Medicare Other | Attending: Nurse Practitioner | Admitting: Neurology

## 2019-12-11 ENCOUNTER — Other Ambulatory Visit: Payer: Self-pay

## 2019-12-11 DIAGNOSIS — R0683 Snoring: Secondary | ICD-10-CM | POA: Insufficient documentation

## 2019-12-11 DIAGNOSIS — G4761 Periodic limb movement disorder: Secondary | ICD-10-CM | POA: Diagnosis not present

## 2019-12-11 DIAGNOSIS — I10 Essential (primary) hypertension: Secondary | ICD-10-CM

## 2019-12-11 DIAGNOSIS — Z79899 Other long term (current) drug therapy: Secondary | ICD-10-CM | POA: Insufficient documentation

## 2019-12-11 DIAGNOSIS — R400 Somnolence: Secondary | ICD-10-CM | POA: Diagnosis not present

## 2019-12-11 DIAGNOSIS — Z7901 Long term (current) use of anticoagulants: Secondary | ICD-10-CM | POA: Insufficient documentation

## 2019-12-11 DIAGNOSIS — G4710 Hypersomnia, unspecified: Secondary | ICD-10-CM

## 2019-12-13 ENCOUNTER — Ambulatory Visit: Payer: Medicare Other | Admitting: Gastroenterology

## 2019-12-24 NOTE — Procedures (Signed)
Marianna A. Merlene Laughter, MD     [email or web address]             NOCTURNAL POLYSOMNOGRAPHY   LOCATION: [facility]  Patient Name: Amber Herman, Amber Herman Date: 12/11/2019 Gender: Female D.O.B: 12-13-1945 Age (years): 42 Referring Provider: Angelina Ok FNP Height (inches): 64 Interpreting Physician: Phillips Odor MD, ABSM Weight (lbs): 144 RPSGT: Rosebud Poles BMI: 25 MRN: [id number] Neck Size: 15.50 CLINICAL INFORMATION Sleep Study Type: NPSG     Indication for sleep study: N/A     Epworth Sleepiness Score: 2     SLEEP STUDY TECHNIQUE As per the AASM Manual for the Scoring of Sleep and Associated Events v2.3 (April 2016) with a hypopnea requiring 4% desaturations.  The channels recorded and monitored were frontal, central and occipital EEG, electrooculogram (EOG), submentalis EMG (chin), nasal and oral airflow, thoracic and abdominal wall motion, anterior tibialis EMG, snore microphone, electrocardiogram, and pulse oximetry.  MEDICATIONS Medications self-administered by patient taken the night of the study : N/A  Current Outpatient Medications:  .  acetaminophen (TYLENOL) 325 MG tablet, Take 2 tablets (650 mg total) by mouth every 8 (eight) hours as needed., Disp: 60 tablet, Rfl: 0 .  amiodarone (PACERONE) 200 MG tablet, TAKE 1 TABLET BY MOUTH ONCE DAILY., Disp: 28 tablet, Rfl: 11 .  apixaban (ELIQUIS) 5 MG TABS tablet, Take 1 tablet (5 mg total) by mouth 2 (two) times daily., Disp: 60 tablet, Rfl: 1 .  carvedilol (COREG) 25 MG tablet, Take 1 tablet (25 mg total) by mouth 2 (two) times daily., Disp: 180 tablet, Rfl: 1 .  esomeprazole (NEXIUM) 40 MG capsule, Take 40 mg by mouth daily before breakfast., Disp: , Rfl:  .  FEROSUL 325 (65 Fe) MG tablet, Take 325 mg by mouth daily. , Disp: , Rfl:  .  isosorbide mononitrate (IMDUR) 30 MG 24 hr tablet, Take 1 tablet (30 mg total) by mouth 2 (two) times daily., Disp: 180 tablet, Rfl: 2 .  losartan (COZAAR)  50 MG tablet, Take 1 tablet (50 mg total) by mouth 2 (two) times daily., Disp: 180 tablet, Rfl: 3 .  magnesium oxide (MAG-OX) 400 (241.3 Mg) MG tablet, TAKE 1 TABLET BY MOUTH ONCE DAILY., Disp: 28 tablet, Rfl: 11 .  metFORMIN (GLUCOPHAGE) 850 MG tablet, Take 850 mg by mouth 2 (two) times daily with a meal. , Disp: , Rfl:  .  nitroGLYCERIN (NITROSTAT) 0.4 MG SL tablet, Place 1 tablet (0.4 mg total) under the tongue every 5 (five) minutes as needed for chest pain (CP or SOB)., Disp: 9 tablet, Rfl: 2 .  pravastatin (PRAVACHOL) 40 MG tablet, Take 40 mg by mouth at bedtime. , Disp: , Rfl:  .  spironolactone (ALDACTONE) 25 MG tablet, Take 0.5 tablets (12.5 mg total) by mouth at bedtime., Disp: 45 tablet, Rfl: 2 .  TRESIBA FLEXTOUCH 200 UNIT/ML SOPN, Inject 16 Units into the skin at bedtime. , Disp: , Rfl:  .  triamcinolone cream (KENALOG) 0.1 %, Apply 1 application topically 2 (two) times daily as needed. 1 application to affected area twice daily, Mix 1:1 with Eucerin externally, Disp: , Rfl:  .  vitamin B-12 (CYANOCOBALAMIN) 1000 MCG tablet, Take 1,000 mcg by mouth daily. , Disp: , Rfl:      SLEEP ARCHITECTURE The study was initiated at 10:08:34 PM and ended at 5:00:03 AM.  Sleep onset time was 5.3 minutes and the sleep efficiency was 80.5%%. The total sleep time was 331.2 minutes.  Stage REM latency was 41.5 minutes.  The patient spent 3.9%% of the night in stage N1 sleep, 44.9%% in stage N2 sleep, 23.6%% in stage N3 and 27.6% in REM.  Alpha intrusion was absent.  Supine sleep was 79.09%.  RESPIRATORY PARAMETERS The overall apnea/hypopnea index (AHI) was 4.7 per hour. There were 0 total apneas, including 0 obstructive, 0 central and 0 mixed apneas. There were 26 hypopneas and 0 RERAs.  The AHI during Stage REM sleep was 16.4 per hour.  AHI while supine was 6.0 per hour.  The mean oxygen saturation was 95.9%. The minimum SpO2 during sleep was 81.0%.  moderate snoring was noted during  this study.  CARDIAC DATA The 2 lead EKG demonstrated sinus rhythm. The mean heart rate was 56.3 beats per minute. Other EKG findings include: None.  LEG MOVEMENT DATA Mild periodic limb movements are noted.  IMPRESSIONS 1.  Mild periodic limb movements are noted.  Otherwise, the recording is unrevealing.  Delano Metz, MD Diplomate, American Board of Sleep Medicine.  ELECTRONICALLY SIGNED ON:  12/24/2019, 8:44 AM [facility] PH: [phone]   FX: [phone] [place]

## 2020-03-21 ENCOUNTER — Other Ambulatory Visit: Payer: Self-pay | Admitting: Cardiology

## 2020-07-04 ENCOUNTER — Other Ambulatory Visit: Payer: Self-pay | Admitting: Cardiology

## 2020-09-20 ENCOUNTER — Other Ambulatory Visit: Payer: Self-pay | Admitting: Cardiology

## 2020-11-05 ENCOUNTER — Other Ambulatory Visit: Payer: Self-pay | Admitting: Cardiology

## 2020-12-19 ENCOUNTER — Encounter (HOSPITAL_COMMUNITY): Payer: Self-pay | Admitting: *Deleted

## 2020-12-19 ENCOUNTER — Emergency Department (HOSPITAL_COMMUNITY)
Admission: EM | Admit: 2020-12-19 | Discharge: 2020-12-19 | Disposition: A | Discharge status: Home / Self Care | Payer: Medicare Other | Attending: Emergency Medicine | Admitting: Emergency Medicine

## 2020-12-19 ENCOUNTER — Other Ambulatory Visit: Payer: Self-pay

## 2020-12-19 ENCOUNTER — Emergency Department (HOSPITAL_COMMUNITY): Payer: Medicare Other

## 2020-12-19 DIAGNOSIS — Y9301 Activity, walking, marching and hiking: Secondary | ICD-10-CM | POA: Diagnosis not present

## 2020-12-19 DIAGNOSIS — E119 Type 2 diabetes mellitus without complications: Secondary | ICD-10-CM | POA: Insufficient documentation

## 2020-12-19 DIAGNOSIS — S0990XA Unspecified injury of head, initial encounter: Secondary | ICD-10-CM | POA: Insufficient documentation

## 2020-12-19 DIAGNOSIS — M25511 Pain in right shoulder: Secondary | ICD-10-CM | POA: Diagnosis not present

## 2020-12-19 DIAGNOSIS — Z79899 Other long term (current) drug therapy: Secondary | ICD-10-CM | POA: Diagnosis not present

## 2020-12-19 DIAGNOSIS — Z91040 Latex allergy status: Secondary | ICD-10-CM | POA: Diagnosis not present

## 2020-12-19 DIAGNOSIS — W010XXA Fall on same level from slipping, tripping and stumbling without subsequent striking against object, initial encounter: Secondary | ICD-10-CM | POA: Insufficient documentation

## 2020-12-19 DIAGNOSIS — Z794 Long term (current) use of insulin: Secondary | ICD-10-CM | POA: Insufficient documentation

## 2020-12-19 DIAGNOSIS — W19XXXA Unspecified fall, initial encounter: Secondary | ICD-10-CM

## 2020-12-19 DIAGNOSIS — I4819 Other persistent atrial fibrillation: Secondary | ICD-10-CM | POA: Insufficient documentation

## 2020-12-19 DIAGNOSIS — I10 Essential (primary) hypertension: Secondary | ICD-10-CM | POA: Diagnosis not present

## 2020-12-19 DIAGNOSIS — S8992XA Unspecified injury of left lower leg, initial encounter: Secondary | ICD-10-CM | POA: Diagnosis present

## 2020-12-19 DIAGNOSIS — S8002XA Contusion of left knee, initial encounter: Secondary | ICD-10-CM | POA: Diagnosis not present

## 2020-12-19 DIAGNOSIS — Z7984 Long term (current) use of oral hypoglycemic drugs: Secondary | ICD-10-CM | POA: Diagnosis not present

## 2020-12-19 DIAGNOSIS — I480 Paroxysmal atrial fibrillation: Secondary | ICD-10-CM | POA: Insufficient documentation

## 2020-12-19 DIAGNOSIS — Z7901 Long term (current) use of anticoagulants: Secondary | ICD-10-CM | POA: Diagnosis not present

## 2020-12-19 LAB — BASIC METABOLIC PANEL
Anion gap: 10 (ref 5–15)
BUN: 14 mg/dL (ref 8–23)
CO2: 26 mmol/L (ref 22–32)
Calcium: 8.6 mg/dL — ABNORMAL LOW (ref 8.9–10.3)
Chloride: 97 mmol/L — ABNORMAL LOW (ref 98–111)
Creatinine, Ser: 0.68 mg/dL (ref 0.44–1.00)
GFR, Estimated: >60 mL/min (ref >60)
Glucose, Bld: 143 mg/dL — ABNORMAL HIGH (ref 70–99)
Potassium: 3.5 mmol/L (ref 3.5–5.1)
Sodium: 133 mmol/L — ABNORMAL LOW (ref 135–145)

## 2020-12-19 LAB — CBC WITH DIFFERENTIAL/PLATELET
Abs Immature Granulocytes: 0.04 10*3/uL (ref 0.00–0.07)
Basophils Absolute: 0 10*3/uL (ref 0.0–0.1)
Basophils Relative: 0 %
Eosinophils Absolute: 0.1 10*3/uL (ref 0.0–0.5)
Eosinophils Relative: 1 %
HCT: 31.5 % — ABNORMAL LOW (ref 36.0–46.0)
Hemoglobin: 10.4 g/dL — ABNORMAL LOW (ref 12.0–15.0)
Immature Granulocytes: 0 %
Lymphocytes Relative: 7 %
Lymphs Abs: 0.9 10*3/uL (ref 0.7–4.0)
MCH: 31.8 pg (ref 26.0–34.0)
MCHC: 33 g/dL (ref 30.0–36.0)
MCV: 96.3 fL (ref 80.0–100.0)
Monocytes Absolute: 1.3 10*3/uL — ABNORMAL HIGH (ref 0.1–1.0)
Monocytes Relative: 9 %
Neutro Abs: 11.4 10*3/uL — ABNORMAL HIGH (ref 1.7–7.7)
Neutrophils Relative %: 83 %
Platelets: 306 10*3/uL (ref 150–400)
RBC: 3.27 MIL/uL — ABNORMAL LOW (ref 3.87–5.11)
RDW: 13.9 % (ref 11.5–15.5)
WBC: 13.8 10*3/uL — ABNORMAL HIGH (ref 4.0–10.5)
nRBC: 0 % (ref 0.0–0.2)

## 2020-12-19 LAB — URINALYSIS, ROUTINE W REFLEX MICROSCOPIC
Bilirubin Urine: NEGATIVE
Glucose, UA: 50 mg/dL — AB
Ketones, ur: NEGATIVE mg/dL
Leukocytes,Ua: NEGATIVE
Nitrite: NEGATIVE
Protein, ur: 100 mg/dL — AB
Specific Gravity, Urine: 1.011 (ref 1.005–1.030)
pH: 7 (ref 5.0–8.0)

## 2020-12-19 MED ORDER — FENTANYL CITRATE (PF) 100 MCG/2ML IJ SOLN
25.0000 ug | Freq: Once | INTRAMUSCULAR | Status: AC
Start: 2020-12-19 — End: 2020-12-19
  Administered 2020-12-19: 25 ug via INTRAVENOUS
  Filled 2020-12-19: qty 2

## 2020-12-19 MED ORDER — SODIUM CHLORIDE 0.9 % IV BOLUS
500.0000 mL | Freq: Once | INTRAVENOUS | Status: AC
  Administered 2020-12-19: 500 mL via INTRAVENOUS

## 2020-12-19 MED ORDER — HYDROCODONE-ACETAMINOPHEN 5-325 MG PO TABS: 1.0000 | ORAL_TABLET | Freq: Four times a day (QID) | ORAL | 0 refills | Status: DC | PRN

## 2020-12-19 MED ORDER — ONDANSETRON HCL 4 MG/2ML IJ SOLN
4.0000 mg | Freq: Once | INTRAMUSCULAR | Status: AC
  Administered 2020-12-19: 4 mg via INTRAVENOUS
  Filled 2020-12-19: qty 2

## 2020-12-19 MED ORDER — ONDANSETRON 4 MG PO TBDP: ORAL_TABLET | ORAL | 0 refills | Status: DC

## 2020-12-19 MED ORDER — HYDROCODONE-ACETAMINOPHEN 5-325 MG PO TABS
1.0000 | ORAL_TABLET | Freq: Once | ORAL | Status: AC
  Administered 2020-12-19: 1 via ORAL
  Filled 2020-12-19: qty 1

## 2020-12-19 NOTE — Discharge Instructions (Signed)
Follow up with primary doctor and ortho doctor for further plans and physical therapy. Zofran for nausea. Take tylenol for pain, For severe pain take norco or vicodin however realize they have the potential for addiction and it can make you sleepy and has tylenol in it.  No operating machinery while taking.

## 2020-12-19 NOTE — ED Provider Notes (Signed)
[facility] EMERGENCY DEPARTMENT Provider Note   CSN: [id number] Arrival date & time: 12/19/20  0813     History Chief Complaint  Patient presents with   Amber Herman is a 75 y.o. female.  Patient with history of A. fib, on Eliquis, anemia, high blood pressure presents with right shoulder arm neck and head pain since fall yesterday.  Patient remembers details, tripped while walking.  No syncope or seizures.  No vomiting.  Patient did not have medicines this morning.  Pain with movement.  Patient has no infectious symptoms and felt at baseline otherwise.      Past Medical History:  Diagnosis Date   Allergic rhinitis    Anemia    Atrial fibrillation ([place])    Diagnosed 2013   DM (diabetes mellitus), type 2 (HCC)    GERD (gastroesophageal reflux disease)    Hyperlipidemia    Hypertension    Nephrolithiasis    Transient ischemic attack (TIA)    Vitamin D deficiency     Patient Active Problem List   Diagnosis Date Noted   Esophageal dysphagia 09/13/2019   GERD (gastroesophageal reflux disease)    Flatulence    Change in bowel function    Chest pain 03/26/2019   Persistent atrial fibrillation ([place])    Demand ischemia ([place])    Atrial fibrillation with RVR ([place]) 02/20/2019   NSTEMI (non-ST elevated myocardial infarction) ([place]) 11/02/2017   AF (paroxysmal atrial fibrillation) ([place]) 02/27/2017   Pyelonephritis 02/26/2017   TIA (transient ischemic attack) 06/29/2014   Long term (current) use of anticoagulants 11/30/2011   Anemia 11/26/2011   Atrial fibrillation with rapid ventricular response ([place]) 11/25/2011   Hyponatremia 11/25/2011   Nausea & vomiting 11/25/2011   Diarrhea 11/25/2011   Hypomagnesemia 11/25/2011   Diabetes mellitus, type II ([place]) 11/25/2011   Hypertension 11/25/2011    Past Surgical History:  Procedure Laterality Date   CARDIOVERSION N/A 02/22/2019   Procedure: CARDIOVERSION;  Surgeon: Donato Heinz, MD;  Location: [facility]  ENDOSCOPY;  Service: Endoscopy;  Laterality: N/A;   CHOLECYSTECTOMY     COLONOSCOPY     normal per patient, more than 10 years ago   LEFT HEART CATH AND CORONARY ANGIOGRAPHY N/A 02/21/2019   Procedure: LEFT HEART CATH AND CORONARY ANGIOGRAPHY;  Surgeon: [name], MD;  Location: [facility];  Service: Cardiovascular;  Laterality: N/A;   TEE WITHOUT CARDIOVERSION N/A 02/22/2019   Procedure: TRANSESOPHAGEAL ECHOCARDIOGRAM (TEE);  Surgeon: Donato Heinz, MD;  Location: [facility] ENDOSCOPY;  Service: Endoscopy;  Laterality: N/A;     OB History     Gravida  4   Para  3   Term  3   Preterm      AB  1   Living  3      SAB  1   IAB      Ectopic      Multiple      Live Births              Family History  Problem Relation Age of Onset   Hypertension Father    Diabetes Father    Heart disease Mother    Alzheimer's disease Mother    Diabetes Mother    Heart disease Brother    Colon cancer Neg Hx     Social History   Tobacco Use   Smoking status: Never   Smokeless tobacco: Never  Vaping Use   Vaping Use: Never used  Substance Use  Topics   Alcohol use: No   Drug use: No    Home Medications Prior to Admission medications   Medication Sig Start Date End Date Taking? Authorizing Provider  acetaminophen (TYLENOL) 325 MG tablet Take 2 tablets (650 mg total) by mouth every 8 (eight) hours as needed. 11/19/17  Yes Mesner, Corene Cornea, MD  amiodarone (PACERONE) 200 MG tablet TAKE 1 TABLET BY MOUTH ONCE DAILY. Patient taking differently: Take 200 mg by mouth daily. 03/21/20  Yes Satira Sark, MD  apixaban (ELIQUIS) 5 MG TABS tablet Take 1 tablet (5 mg total) by mouth 2 (two) times daily. 11/03/17  Yes Shah, Pratik D, DO  carvedilol (COREG) 25 MG tablet Take 1 tablet (25 mg total) by mouth 2 (two) times daily. Patient taking differently: Take 6.25 mg by mouth 2 (two) times daily. 08/21/19 12/19/20 Yes Satira Sark, MD  escitalopram (LEXAPRO) 5 MG tablet  Take 10 mg by mouth in the morning. 11/29/20  Yes [provider]  esomeprazole (NEXIUM) 40 MG capsule Take 40 mg by mouth daily before breakfast. 09/05/19  Yes [provider]  FEROSUL 325 (65 Fe) MG tablet Take 325 mg by mouth daily.  02/03/19  Yes [provider]  HYDROcodone-acetaminophen (NORCO) 5-325 MG tablet Take 1 tablet by mouth every 6 (six) hours as needed for severe pain. 12/19/20  Yes Elnora Morrison, MD  levothyroxine (SYNTHROID) 75 MCG tablet Take 75 mcg by mouth daily. 11/29/20  Yes [provider]  losartan (COZAAR) 50 MG tablet Take 1 tablet (50 mg total) by mouth 2 (two) times daily. 04/19/19 12/19/20 Yes Satira Sark, MD  metFORMIN (GLUCOPHAGE) 850 MG tablet Take 850 mg by mouth 2 (two) times daily with a meal.  01/16/19  Yes [provider]  ondansetron (ZOFRAN ODT) 4 MG disintegrating tablet 4mg  ODT q4 hours prn nausea/vomit 12/19/20  Yes Elnora Morrison, MD  pravastatin (PRAVACHOL) 40 MG tablet Take 40 mg by mouth at bedtime.    Yes [provider]  prazosin (MINIPRESS) 2 MG capsule Take 2-4 mg by mouth in the morning and at bedtime. 1 in the am and 2 at bedtime 11/29/20  Yes [provider]  TRESIBA FLEXTOUCH 200 UNIT/ML SOPN Inject 16 Units into the skin at bedtime.  02/10/17  Yes [provider]  vitamin B-12 (CYANOCOBALAMIN) 1000 MCG tablet Take 1,000 mcg by mouth daily.  01/16/19  Yes [provider]  isosorbide mononitrate (IMDUR) 30 MG 24 hr tablet Take 1 tablet (30 mg total) by mouth 2 (two) times daily. Patient not taking: No sig reported 06/01/19   Satira Sark, MD  magnesium oxide (MAG-OX) 400 (241.3 Mg) MG tablet TAKE 1 TABLET BY MOUTH ONCE DAILY. Patient not taking: No sig reported 06/14/19   Satira Sark, MD  nitroGLYCERIN (NITROSTAT) 0.4 MG SL tablet Place 1 tablet (0.4 mg total) under the tongue every 5 (five) minutes as needed for chest pain (CP or SOB). 02/23/19   Shahmehdi, Valeria Batman, MD  spironolactone (ALDACTONE) 25 MG tablet Take 0.5 tablets (12.5 mg total) by mouth at bedtime. Patient not taking: No sig reported 09/18/19   Skeet Latch, MD    Allergies    Amlodipine, Angiotensin receptor blockers, Calcium channel blockers, Latex, Nicardipine hcl, Simvastatin, Clonidine, Sulfa antibiotics, Sulfacetamide sodium, and Sulfasalazine  Review of Systems   Review of Systems  Constitutional:  Negative for chills and fever.  HENT:  Negative for congestion.   Eyes:  Negative for visual disturbance.  Respiratory:  Negative for shortness of breath.   Cardiovascular:  Negative for chest pain.  Gastrointestinal:  Negative for abdominal pain and vomiting.  Genitourinary:  Negative for dysuria and flank pain.  Musculoskeletal:  Positive for joint swelling. Negative for back pain, neck pain and neck stiffness.  Skin:  Negative for rash.  Neurological:  Negative for light-headedness, numbness and headaches.   Physical Exam Updated Vital Signs BP (!) 188/77   Pulse 78   Temp 99 F (37.2 C)   Resp 16   Ht 5\' 4"  (1.626 m)   Wt 63.5 kg   SpO2 95%   BMI 24.03 kg/m   Physical Exam Vitals and nursing note reviewed.  Constitutional:      General: She is not in acute distress.    Appearance: She is well-developed.  HENT:     Head: Normocephalic and atraumatic.     Mouth/Throat:     Mouth: Mucous membranes are moist.  Eyes:     General:        Right eye: No discharge.        Left eye: No discharge.     Conjunctiva/sclera: Conjunctivae normal.  Neck:     Trachea: No tracheal deviation.  Cardiovascular:     Rate and Rhythm: Normal rate.     Heart sounds: No murmur heard. Pulmonary:     Effort: Pulmonary effort is normal.  Abdominal:     General: There is no distension.     Palpations: Abdomen is soft.     Tenderness: There is no abdominal tenderness. There is no guarding.  Musculoskeletal:        General: Swelling and tenderness present.     Cervical back:  Normal range of motion and neck supple. No rigidity.     Comments: Patient has mild midline and right paraspinal mid cervical tenderness without step-off.  Patient has tenderness anterior right shoulder and proximal humerus and trapezius region on the right.  Patient mild tenderness anterior left proximal tibia and patella with mild ecchymosis.  Patient has no other significant tenderness at major joints.  Equal strength bilateral upper and lower extremities.  Skin:    General: Skin is warm.     Capillary Refill: Capillary refill takes less than 2 seconds.     Findings: No rash.  Neurological:     General: No focal deficit present.     Mental Status: She is alert.     Cranial Nerves: No cranial nerve deficit.  Psychiatric:        Mood and Affect: Mood normal.    ED Results / Procedures / Treatments   Labs (all labs ordered are listed, but only abnormal results are displayed) Labs Reviewed  CBC WITH DIFFERENTIAL/PLATELET - Abnormal; Notable for the following components:      Result Value   WBC 13.8 (*)    RBC 3.27 (*)    Hemoglobin 10.4 (*)    HCT 31.5 (*)    Neutro Abs 11.4 (*)    Monocytes Absolute 1.3 (*)    All other components within normal limits  BASIC METABOLIC PANEL - Abnormal; Notable for the following components:   Sodium 133 (*)    Chloride 97 (*)    Glucose, Bld 143 (*)    Calcium 8.6 (*)    All other components within normal limits  URINALYSIS, ROUTINE W REFLEX MICROSCOPIC - Abnormal; Notable for the following components:   Color, Urine STRAW (*)    Glucose, UA 50 (*)  Hgb urine dipstick SMALL (*)    Protein, ur 100 (*)    Bacteria, UA RARE (*)    All other components within normal limits    EKG EKG Interpretation  Date/Time:  Thursday December 19 2020 08:29:23 EDT Ventricular Rate:  71 PR Interval:  162 QRS Duration: 91 QT Interval:  452 QTC Calculation: 492 R Axis:   0 Text Interpretation: Sinus rhythm Anterior infarct, old Minimal ST depression,  lateral leads Confirmed by Elnora Morrison [phone]) on 12/19/2020 11:50:19 AM  Radiology No results found. DG Shoulder Right  Result Date: 12/19/2020 CLINICAL DATA:  Fall onto concrete.  Right shoulder pain. EXAM: RIGHT SHOULDER - 2+ VIEW COMPARISON:  None. FINDINGS: No evidence for gross fracture. No shoulder separation or dislocation. Degenerative changes are noted at the acromioclavicular joint with undersurface spurring on the acromion. Scapular Y-view shows irregularity and possible fracture involving the acromion although acromion looks relatively normal on the [place] view. Degenerative changes are noted in the glenohumeral joint. IMPRESSION: 1. No definite fracture although acromion has atypical appearance on the scapular Y-view raising the question of fracture. Correlation for point tenderness in this region recommended. CT imaging could be used to further evaluate as clinically warranted. Electronically Signed   By: Misty Stanley M.D.   On: 12/19/2020 12:33   CT Head Wo Contrast  Result Date: 12/19/2020 CLINICAL DATA:  Patient fell on concrete.  Right-sided facial pain. EXAM: CT HEAD WITHOUT CONTRAST CT CERVICAL SPINE WITHOUT CONTRAST TECHNIQUE: Multidetector CT imaging of the head and cervical spine was performed following the standard protocol without intravenous contrast. Multiplanar CT image reconstructions of the cervical spine were also generated. COMPARISON:  06/29/2014 head CT. FINDINGS: CT HEAD FINDINGS Brain: There is no evidence for acute hemorrhage, hydrocephalus, mass lesion, or abnormal extra-axial fluid collection. No definite CT evidence for acute infarction. Patchy low attenuation in the deep hemispheric and periventricular white matter is nonspecific, but likely reflects chronic microvascular ischemic demyelination. Vascular: No hyperdense vessel or unexpected calcification. Skull: No evidence for fracture. No worrisome lytic or sclerotic lesion. Sinuses/Orbits: The visualized  paranasal sinuses and mastoid air cells are clear. Visualized portions of the globes and intraorbital fat are unremarkable. Other: None. CT CERVICAL SPINE FINDINGS Alignment: Normal. Skull base and vertebrae: No acute fracture. No primary bone lesion or focal pathologic process. Soft tissues and spinal canal: No prevertebral fluid or swelling. No visible canal hematoma. Disc levels: Mild diffuse loss of disc height noted from C3-4 down to C6-7. Mild associated facet degeneration is more prominent on the right than the left. Upper chest: Negative. Other: None. IMPRESSION: 1. No acute intracranial abnormality. Chronic small vessel white matter ischemic disease. 2. Degenerative changes in the cervical spine without evidence for an acute fracture. Electronically Signed   By: Misty Stanley M.D.   On: 12/19/2020 12:38   CT Cervical Spine Wo Contrast  Result Date: 12/19/2020 CLINICAL DATA:  Patient fell on concrete.  Right-sided facial pain. EXAM: CT HEAD WITHOUT CONTRAST CT CERVICAL SPINE WITHOUT CONTRAST TECHNIQUE: Multidetector CT imaging of the head and cervical spine was performed following the standard protocol without intravenous contrast. Multiplanar CT image reconstructions of the cervical spine were also generated. COMPARISON:  06/29/2014 head CT. FINDINGS: CT HEAD FINDINGS Brain: There is no evidence for acute hemorrhage, hydrocephalus, mass lesion, or abnormal extra-axial fluid collection. No definite CT evidence for acute infarction. Patchy low attenuation in the deep hemispheric and periventricular white matter is nonspecific, but likely reflects chronic microvascular ischemic demyelination. Vascular:  No hyperdense vessel or unexpected calcification. Skull: No evidence for fracture. No worrisome lytic or sclerotic lesion. Sinuses/Orbits: The visualized paranasal sinuses and mastoid air cells are clear. Visualized portions of the globes and intraorbital fat are unremarkable. Other: None. CT CERVICAL SPINE  FINDINGS Alignment: Normal. Skull base and vertebrae: No acute fracture. No primary bone lesion or focal pathologic process. Soft tissues and spinal canal: No prevertebral fluid or swelling. No visible canal hematoma. Disc levels: Mild diffuse loss of disc height noted from C3-4 down to C6-7. Mild associated facet degeneration is more prominent on the right than the left. Upper chest: Negative. Other: None. IMPRESSION: 1. No acute intracranial abnormality. Chronic small vessel white matter ischemic disease. 2. Degenerative changes in the cervical spine without evidence for an acute fracture. Electronically Signed   By: Misty Stanley M.D.   On: 12/19/2020 12:38   DG Knee Complete 4 Views Left  Result Date: 12/19/2020 CLINICAL DATA:  Left knee pain after fall yesterday. EXAM: LEFT KNEE - COMPLETE 4+ VIEW COMPARISON:  None. FINDINGS: No evidence of fracture, dislocation, or joint effusion. No evidence of arthropathy or other focal bone abnormality. Soft tissues are unremarkable. IMPRESSION: Negative. Electronically Signed   By: Titus Dubin M.D.   On: 12/19/2020 12:30   DG Humerus Right  Result Date: 12/19/2020 CLINICAL DATA:  Fall yesterday.  Right shoulder and arm pain. EXAM: RIGHT HUMERUS - 2+ VIEW COMPARISON:  Right shoulder 11/19/2017 FINDINGS: Chronic densities around the right shoulder. Peripheral IV is likely in the right antecubital region. Right humerus is intact. Visualized right ribs are intact. IMPRESSION: 1. No acute abnormality to the right humerus. 2. Chronic changes involving the right shoulder. Electronically Signed   By: Markus Daft M.D.   On: 12/19/2020 12:30    Procedures Procedures     Medications Ordered in ED Medications  HYDROcodone-acetaminophen (NORCO/VICODIN) 5-325 MG per tablet 1 tablet (1 tablet Oral Given 12/19/20 1010)  fentaNYL (SUBLIMAZE) injection 25 mcg (25 mcg Intravenous Given 12/19/20 1028)  ondansetron (ZOFRAN) injection 4 mg (4 mg Intravenous Given 12/19/20  1222)  sodium chloride 0.9 % bolus 500 mL (0 mLs Intravenous Stopped 12/19/20 1400)    ED Course  I have reviewed the triage vital signs and the nursing notes.  Pertinent labs & imaging results that were available during my care of the patient were reviewed by me and considered in my medical decision making (see chart for details).    MDM Rules/Calculators/A&P                           Patient presents with history of high blood pressure and A. fib did not take medication this morning, instructed they could take all medicines except for Eliquis to ensure no head bleed from head injury.  Blood pressure elevation in the ER likely secondary to pain and not taking medications.  Pain meds ordered.  General blood work ordered due to age to ensure no worsening anemia or electrolyte abnormalities that may have led to the fall.  History of fall seems mechanical.  CTs and x-rays pending.  Blood work showed wbc 13.8, no clinical signs of infection, Hb 10.4 no bleeding, Na 133. CT head no acute bleeding. Pt improved on reassessment.  Xrays no acute fractures, reviewed.   UA no signs of infection.   Final Clinical Impression(s) / ED Diagnoses Final diagnoses:  Primary hypertension  Fall, initial encounter  Acute head injury, initial encounter  Contusion  of left knee, initial encounter    Rx / DC Orders ED Discharge Orders          Ordered    ondansetron (ZOFRAN ODT) 4 MG disintegrating tablet        12/19/20 1427    HYDROcodone-acetaminophen (NORCO) 5-325 MG tablet  Every 6 hours PRN        12/19/20 1427             Elnora Morrison, MD 12/23/20 1430

## 2020-12-19 NOTE — ED Notes (Signed)
Pt report she needed to use the bathroom. Explained to pt that using a pure wick would be in her best interest since she may have a broken bone. Daughter and pt refused and daughter insisted on helping her mother to the bathroom.

## 2020-12-19 NOTE — ED Triage Notes (Signed)
Pt c/o fall yesterday morning outside on the concrete. Pt reports the concrete was uneven and she tripped. Pt c/o majority of pain to right shoulder/upper arm, but also to right side of face and bilateral knees. Pt's right shoulder is swollen and has abrasion to left knee. Pt is on Eliquis. Denies LOC upon fall.

## 2021-01-07 ENCOUNTER — Other Ambulatory Visit: Payer: Self-pay | Admitting: Physician Assistant

## 2021-01-07 DIAGNOSIS — M7541 Impingement syndrome of right shoulder: Secondary | ICD-10-CM

## 2021-01-21 IMAGING — DX DG CHEST 1V PORT
1 series · 1 of 1 positions shown · non-contrast
Comparison: 02/19/2019

CLINICAL DATA: Left-sided chest pain

EXAM:
PORTABLE CHEST 1 VIEW

[chest ap]
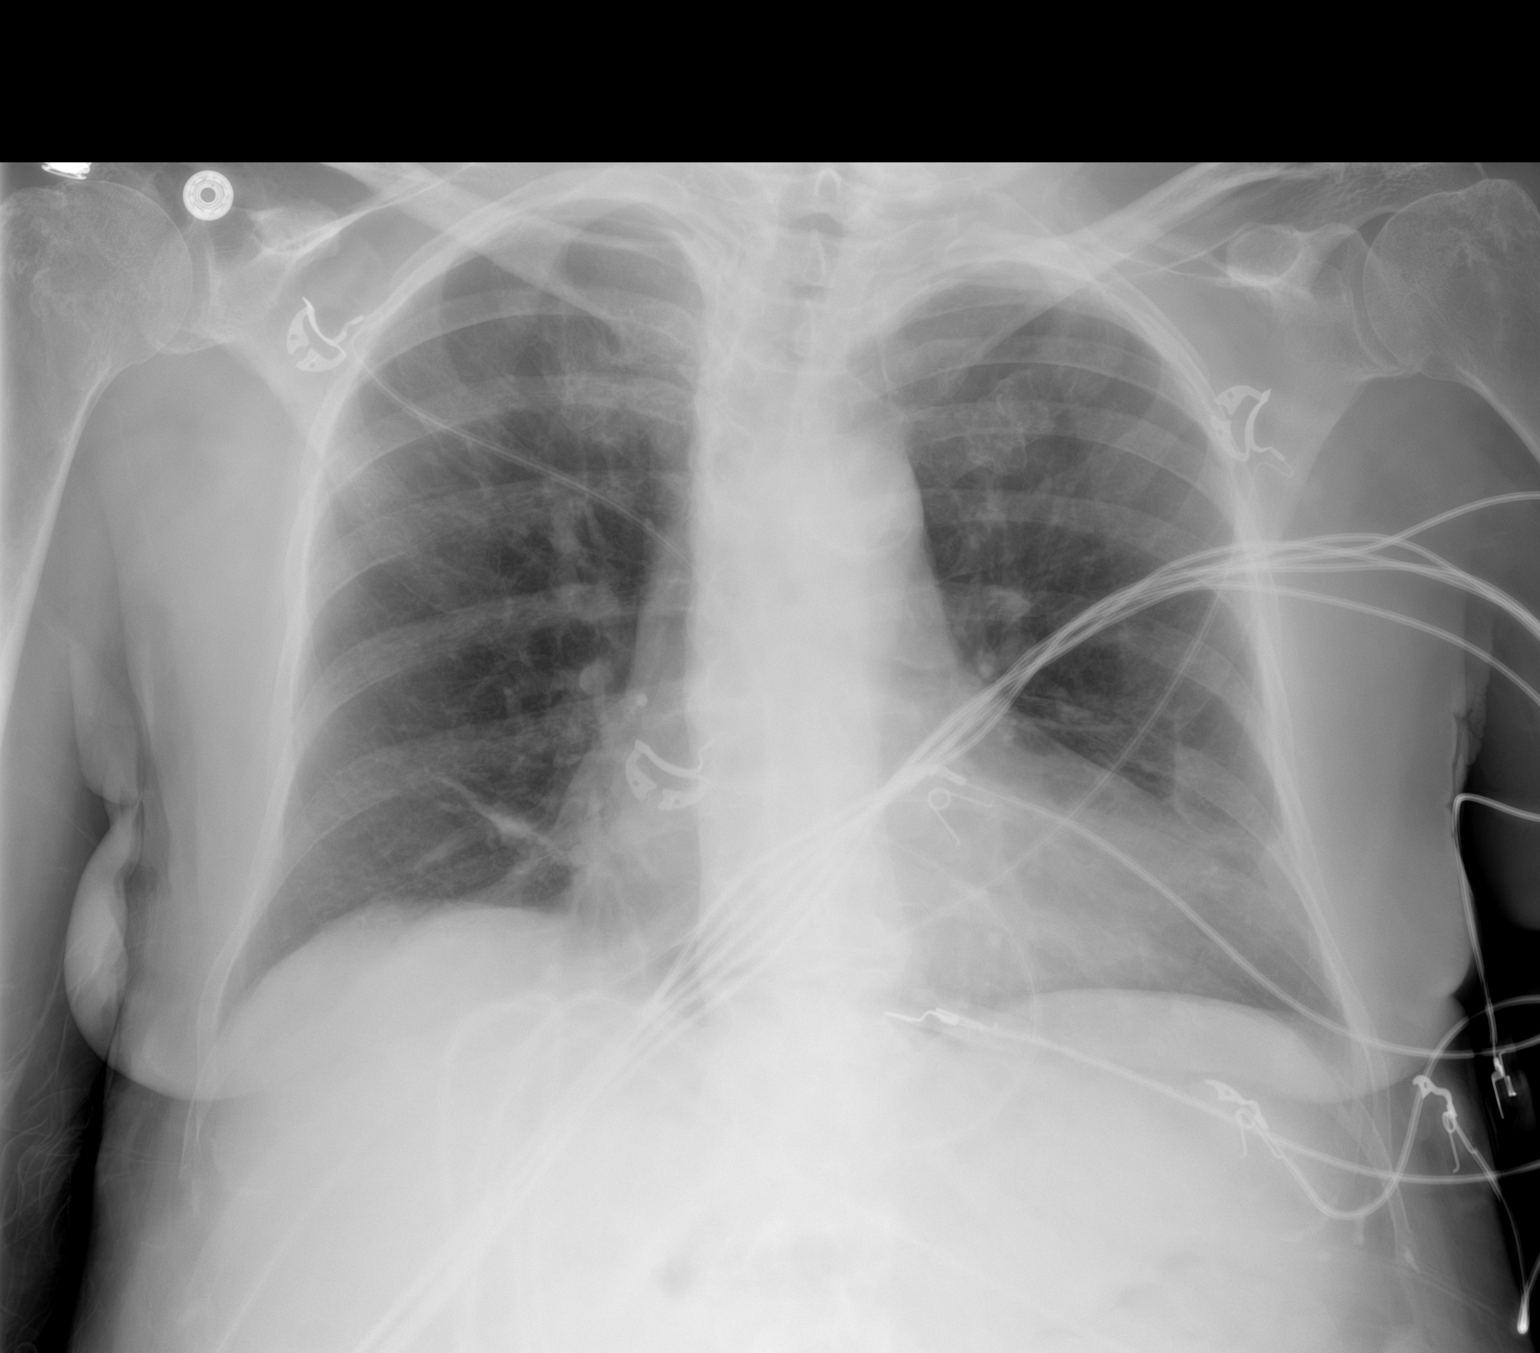

[1 of 1 positions shown; findings below may reference images not displayed]

FINDINGS: Cardiomegaly. Both lungs are clear. The visualized skeletal
structures are unremarkable.
IMPRESSION: Cardiomegaly without acute abnormality of the lungs.

## 2021-10-03 ENCOUNTER — Emergency Department (HOSPITAL_COMMUNITY): Payer: Medicare Other

## 2021-10-03 ENCOUNTER — Encounter (HOSPITAL_COMMUNITY): Payer: Self-pay

## 2021-10-03 ENCOUNTER — Inpatient Hospital Stay (HOSPITAL_COMMUNITY)
Admission: EM | Admit: 2021-10-03 | Discharge: 2021-10-06 | DRG: 378 | Disposition: A | Discharge status: Home / Self Care | Payer: Medicare Other | Attending: Internal Medicine | Admitting: Internal Medicine

## 2021-10-03 ENCOUNTER — Other Ambulatory Visit: Payer: Self-pay

## 2021-10-03 DIAGNOSIS — Z820 Family history of epilepsy and other diseases of the nervous system: Secondary | ICD-10-CM

## 2021-10-03 DIAGNOSIS — Z833 Family history of diabetes mellitus: Secondary | ICD-10-CM

## 2021-10-03 DIAGNOSIS — I480 Paroxysmal atrial fibrillation: Secondary | ICD-10-CM | POA: Diagnosis present

## 2021-10-03 DIAGNOSIS — E7800 Pure hypercholesterolemia, unspecified: Secondary | ICD-10-CM | POA: Diagnosis present

## 2021-10-03 DIAGNOSIS — F32A Depression, unspecified: Secondary | ICD-10-CM | POA: Diagnosis present

## 2021-10-03 DIAGNOSIS — Z79890 Hormone replacement therapy (postmenopausal): Secondary | ICD-10-CM

## 2021-10-03 DIAGNOSIS — D649 Anemia, unspecified: Secondary | ICD-10-CM | POA: Diagnosis present

## 2021-10-03 DIAGNOSIS — E860 Dehydration: Secondary | ICD-10-CM | POA: Diagnosis present

## 2021-10-03 DIAGNOSIS — K219 Gastro-esophageal reflux disease without esophagitis: Secondary | ICD-10-CM | POA: Diagnosis present

## 2021-10-03 DIAGNOSIS — Z888 Allergy status to other drugs, medicaments and biological substances status: Secondary | ICD-10-CM

## 2021-10-03 DIAGNOSIS — Z7901 Long term (current) use of anticoagulants: Secondary | ICD-10-CM

## 2021-10-03 DIAGNOSIS — E559 Vitamin D deficiency, unspecified: Secondary | ICD-10-CM | POA: Diagnosis present

## 2021-10-03 DIAGNOSIS — F419 Anxiety disorder, unspecified: Secondary | ICD-10-CM | POA: Diagnosis present

## 2021-10-03 DIAGNOSIS — K922 Gastrointestinal hemorrhage, unspecified: Secondary | ICD-10-CM | POA: Diagnosis present

## 2021-10-03 DIAGNOSIS — K5733 Diverticulitis of large intestine without perforation or abscess with bleeding: Principal | ICD-10-CM | POA: Diagnosis present

## 2021-10-03 DIAGNOSIS — I951 Orthostatic hypotension: Secondary | ICD-10-CM | POA: Diagnosis present

## 2021-10-03 DIAGNOSIS — Z79899 Other long term (current) drug therapy: Secondary | ICD-10-CM

## 2021-10-03 DIAGNOSIS — Z8249 Family history of ischemic heart disease and other diseases of the circulatory system: Secondary | ICD-10-CM

## 2021-10-03 DIAGNOSIS — N179 Acute kidney failure, unspecified: Secondary | ICD-10-CM | POA: Diagnosis present

## 2021-10-03 DIAGNOSIS — Z8673 Personal history of transient ischemic attack (TIA), and cerebral infarction without residual deficits: Secondary | ICD-10-CM

## 2021-10-03 DIAGNOSIS — Z882 Allergy status to sulfonamides status: Secondary | ICD-10-CM

## 2021-10-03 DIAGNOSIS — E872 Acidosis, unspecified: Secondary | ICD-10-CM | POA: Diagnosis present

## 2021-10-03 DIAGNOSIS — E119 Type 2 diabetes mellitus without complications: Secondary | ICD-10-CM

## 2021-10-03 DIAGNOSIS — R55 Syncope and collapse: Secondary | ICD-10-CM | POA: Diagnosis present

## 2021-10-03 DIAGNOSIS — K317 Polyp of stomach and duodenum: Secondary | ICD-10-CM | POA: Diagnosis present

## 2021-10-03 DIAGNOSIS — K5792 Diverticulitis of intestine, part unspecified, without perforation or abscess without bleeding: Secondary | ICD-10-CM | POA: Diagnosis present

## 2021-10-03 DIAGNOSIS — K921 Melena: Secondary | ICD-10-CM | POA: Diagnosis not present

## 2021-10-03 DIAGNOSIS — I10 Essential (primary) hypertension: Secondary | ICD-10-CM | POA: Diagnosis present

## 2021-10-03 DIAGNOSIS — Z91040 Latex allergy status: Secondary | ICD-10-CM

## 2021-10-03 DIAGNOSIS — D509 Iron deficiency anemia, unspecified: Secondary | ICD-10-CM | POA: Diagnosis present

## 2021-10-03 DIAGNOSIS — N390 Urinary tract infection, site not specified: Secondary | ICD-10-CM | POA: Diagnosis present

## 2021-10-03 HISTORY — DX: Disorder of thyroid, unspecified: E07.9

## 2021-10-03 HISTORY — DX: Unspecified rotator cuff tear or rupture of unspecified shoulder, not specified as traumatic: M75.100

## 2021-10-03 LAB — CBC WITH DIFFERENTIAL/PLATELET
Abs Immature Granulocytes: 0.03 10*3/uL (ref 0.00–0.07)
Basophils Absolute: 0 10*3/uL (ref 0.0–0.1)
Basophils Relative: 0 %
Eosinophils Absolute: 0.1 10*3/uL (ref 0.0–0.5)
Eosinophils Relative: 1 %
HCT: 34 % — ABNORMAL LOW (ref 36.0–46.0)
Hemoglobin: 10.9 g/dL — ABNORMAL LOW (ref 12.0–15.0)
Immature Granulocytes: 0 %
Lymphocytes Relative: 15 %
Lymphs Abs: 1.4 10*3/uL (ref 0.7–4.0)
MCH: 31.1 pg (ref 26.0–34.0)
MCHC: 32.1 g/dL (ref 30.0–36.0)
MCV: 97.1 fL (ref 80.0–100.0)
Monocytes Absolute: 0.7 10*3/uL (ref 0.1–1.0)
Monocytes Relative: 8 %
Neutro Abs: 6.9 10*3/uL (ref 1.7–7.7)
Neutrophils Relative %: 76 %
Platelets: 332 10*3/uL (ref 150–400)
RBC: 3.5 MIL/uL — ABNORMAL LOW (ref 3.87–5.11)
RDW: 13.8 % (ref 11.5–15.5)
WBC: 9.1 10*3/uL (ref 4.0–10.5)
nRBC: 0 % (ref 0.0–0.2)

## 2021-10-03 LAB — TROPONIN I (HIGH SENSITIVITY)
Troponin I (High Sensitivity): 3 ng/L (ref <18)
Troponin I (High Sensitivity): 3 ng/L (ref <18)

## 2021-10-03 LAB — URINALYSIS, ROUTINE W REFLEX MICROSCOPIC
Bilirubin Urine: NEGATIVE
Glucose, UA: 50 mg/dL — AB
Hgb urine dipstick: NEGATIVE
Ketones, ur: NEGATIVE mg/dL
Nitrite: NEGATIVE
Protein, ur: 30 mg/dL — AB
Specific Gravity, Urine: 1.017 (ref 1.005–1.030)
Trans Epithel, UA: 2
WBC, UA: >50 WBC/hpf — ABNORMAL HIGH (ref 0–5)
pH: 5 (ref 5.0–8.0)

## 2021-10-03 LAB — COMPREHENSIVE METABOLIC PANEL
ALT: 17 U/L (ref 0–44)
AST: 19 U/L (ref 15–41)
Albumin: 3.7 g/dL (ref 3.5–5.0)
Alkaline Phosphatase: 51 U/L (ref 38–126)
Anion gap: 12 (ref 5–15)
BUN: 23 mg/dL (ref 8–23)
CO2: 20 mmol/L — ABNORMAL LOW (ref 22–32)
Calcium: 8.9 mg/dL (ref 8.9–10.3)
Chloride: 103 mmol/L (ref 98–111)
Creatinine, Ser: 1.15 mg/dL — ABNORMAL HIGH (ref 0.44–1.00)
GFR, Estimated: 49 mL/min — ABNORMAL LOW (ref >60)
Glucose, Bld: 117 mg/dL — ABNORMAL HIGH (ref 70–99)
Potassium: 4.3 mmol/L (ref 3.5–5.1)
Sodium: 135 mmol/L (ref 135–145)
Total Bilirubin: 0.4 mg/dL (ref 0.3–1.2)
Total Protein: 7 g/dL (ref 6.5–8.1)

## 2021-10-03 LAB — GLUCOSE, CAPILLARY: Glucose-Capillary: 101 mg/dL — ABNORMAL HIGH (ref 70–99)

## 2021-10-03 LAB — CBG MONITORING, ED: Glucose-Capillary: 118 mg/dL — ABNORMAL HIGH (ref 70–99)

## 2021-10-03 LAB — POC OCCULT BLOOD, ED: Fecal Occult Bld: NEGATIVE

## 2021-10-03 MED ORDER — ACETAMINOPHEN 325 MG PO TABS: 650.0000 mg | ORAL_TABLET | Freq: Four times a day (QID) | ORAL | Status: DC | PRN

## 2021-10-03 MED ORDER — INSULIN ASPART 100 UNIT/ML IJ SOLN
0.0000 [IU] | Freq: Three times a day (TID) | INTRAMUSCULAR | Status: DC
  Administered 2021-10-04: 3 [IU] via SUBCUTANEOUS
  Filled 2021-10-03: qty 0.09

## 2021-10-03 MED ORDER — LEVOTHYROXINE SODIUM 100 MCG PO TABS
100.0000 ug | ORAL_TABLET | Freq: Every day | ORAL | Status: DC
  Administered 2021-10-04 – 2021-10-06 (×3): 100 ug via ORAL
  Filled 2021-10-03 (×3): qty 1

## 2021-10-03 MED ORDER — MELATONIN 3 MG PO TABS: 3.0000 mg | ORAL_TABLET | Freq: Every evening | ORAL | Status: DC | PRN

## 2021-10-03 MED ORDER — INSULIN ASPART 100 UNIT/ML IJ SOLN
0.0000 [IU] | Freq: Every day | INTRAMUSCULAR | Status: DC
  Administered 2021-10-04: 2 [IU] via SUBCUTANEOUS
  Filled 2021-10-03: qty 0.05

## 2021-10-03 MED ORDER — SODIUM CHLORIDE 0.9 % IV BOLUS
500.0000 mL | Freq: Once | INTRAVENOUS | Status: AC
  Administered 2021-10-03: 500 mL via INTRAVENOUS

## 2021-10-03 MED ORDER — AMIODARONE HCL 200 MG PO TABS
200.0000 mg | ORAL_TABLET | Freq: Every day | ORAL | Status: DC
  Administered 2021-10-04 – 2021-10-06 (×3): 200 mg via ORAL
  Filled 2021-10-03 (×3): qty 1

## 2021-10-03 MED ORDER — SODIUM CHLORIDE 0.9 % IV SOLN
2.0000 g | Freq: Once | INTRAVENOUS | Status: AC
  Administered 2021-10-03: 2 g via INTRAVENOUS
  Filled 2021-10-03: qty 20

## 2021-10-03 MED ORDER — ONDANSETRON HCL 4 MG/2ML IJ SOLN: 4.0000 mg | Freq: Four times a day (QID) | INTRAMUSCULAR | Status: DC | PRN

## 2021-10-03 MED ORDER — POLYETHYLENE GLYCOL 3350 17 G PO PACK
17.0000 g | PACK | Freq: Every day | ORAL | Status: DC | PRN
Start: 2021-10-03 — End: 2021-10-06

## 2021-10-03 MED ORDER — METRONIDAZOLE 500 MG/100ML IV SOLN
500.0000 mg | Freq: Once | INTRAVENOUS | Status: AC
  Administered 2021-10-03: 500 mg via INTRAVENOUS
  Filled 2021-10-03: qty 100

## 2021-10-03 MED ORDER — IOHEXOL 350 MG/ML SOLN
80.0000 mL | Freq: Once | INTRAVENOUS | Status: AC | PRN
  Administered 2021-10-03: 80 mL via INTRAVENOUS

## 2021-10-03 MED ORDER — PANTOPRAZOLE SODIUM 40 MG IV SOLR
40.0000 mg | Freq: Once | INTRAVENOUS | Status: AC
  Administered 2021-10-03: 40 mg via INTRAVENOUS
  Filled 2021-10-03: qty 10

## 2021-10-03 MED ORDER — PANTOPRAZOLE SODIUM 40 MG IV SOLR
40.0000 mg | Freq: Two times a day (BID) | INTRAVENOUS | Status: DC
  Administered 2021-10-03 – 2021-10-06 (×6): 40 mg via INTRAVENOUS
  Filled 2021-10-03 (×6): qty 10

## 2021-10-03 MED ORDER — METRONIDAZOLE 500 MG/100ML IV SOLN
500.0000 mg | Freq: Two times a day (BID) | INTRAVENOUS | Status: DC
  Administered 2021-10-04 – 2021-10-06 (×5): 500 mg via INTRAVENOUS
  Filled 2021-10-03 (×5): qty 100

## 2021-10-03 NOTE — ED Triage Notes (Signed)
Patient reports a near syncope episode today. ?Patient states that she is on Eliquis and states she has been having dark stools. Patient also c/o feeling "gassy" ?

## 2021-10-03 NOTE — ED Provider Notes (Signed)
?[facility] DEPT ?Provider Note ? ? ?CSN: [id number] ?Arrival date & time: 10/03/21  1745 ? ?  ? ?History ? ?Chief Complaint  ?Patient presents with  ? Near Syncope  ? dark stools  ? ? ?Amber Herman is a 76 y.o. female. ? ?Patient states that she had an episode or 2 of near syncope today.  States she noticed some dark stools today and having some cramping pain in her abdomen.  History of diabetes, hypertension, high cholesterol, atrial fibrillation on Eliquis.  Denies any history of GI bleeds.  Denies any heavy alcohol use or heavy ibuprofen use.  Had a colonoscopy over 10 years ago that she states is unremarkable somewhere in [place].  She denies any active chest pain or shortness of breath or lightheadedness.  She denies any major changes in her diet. ? ? ? ?  ? ?Home Medications ?Prior to Admission medications   ?Medication Sig Start Date End Date Taking? Authorizing Provider  ?acetaminophen (TYLENOL) 325 MG tablet Take 2 tablets (650 mg total) by mouth every 8 (eight) hours as needed. 11/19/17   Mesner, Corene Cornea, MD  ?amiodarone (PACERONE) 200 MG tablet TAKE 1 TABLET BY MOUTH ONCE DAILY. ?Patient taking differently: Take 200 mg by mouth daily. 03/21/20   Satira Sark, MD  ?apixaban (ELIQUIS) 5 MG TABS tablet Take 1 tablet (5 mg total) by mouth 2 (two) times daily. 11/03/17   Manuella Ghazi, Pratik D, DO  ?carvedilol (COREG) 25 MG tablet Take 1 tablet (25 mg total) by mouth 2 (two) times daily. ?Patient taking differently: Take 6.25 mg by mouth 2 (two) times daily. 08/21/19 12/19/20  Satira Sark, MD  ?escitalopram (LEXAPRO) 5 MG tablet Take 10 mg by mouth in the morning. 11/29/20   [provider]  ?esomeprazole (NEXIUM) 40 MG capsule Take 40 mg by mouth daily before breakfast. 09/05/19   [provider]  ?FEROSUL 325 (65 Fe) MG tablet Take 325 mg by mouth daily.  02/03/19   [provider]  ?HYDROcodone-acetaminophen (NORCO) 5-325 MG tablet Take 1 tablet  by mouth every 6 (six) hours as needed for severe pain. 12/19/20   Elnora Morrison, MD  ?isosorbide mononitrate (IMDUR) 30 MG 24 hr tablet Take 1 tablet (30 mg total) by mouth 2 (two) times daily. ?Patient not taking: No sig reported 06/01/19   Satira Sark, MD  ?levothyroxine (SYNTHROID) 75 MCG tablet Take 75 mcg by mouth daily. 11/29/20   [provider]  ?losartan (COZAAR) 50 MG tablet Take 1 tablet (50 mg total) by mouth 2 (two) times daily. 04/19/19 12/19/20  Satira Sark, MD  ?magnesium oxide (MAG-OX) 400 (241.3 Mg) MG tablet TAKE 1 TABLET BY MOUTH ONCE DAILY. ?Patient not taking: No sig reported 06/14/19   Satira Sark, MD  ?metFORMIN (GLUCOPHAGE) 850 MG tablet Take 850 mg by mouth 2 (two) times daily with a meal.  01/16/19   [provider]  ?nitroGLYCERIN (NITROSTAT) 0.4 MG SL tablet Place 1 tablet (0.4 mg total) under the tongue every 5 (five) minutes as needed for chest pain (CP or SOB). 02/23/19   Deatra James, MD  ?ondansetron (ZOFRAN ODT) 4 MG disintegrating tablet '4mg'$  ODT q4 hours prn nausea/vomit 12/19/20   Elnora Morrison, MD  ?pravastatin (PRAVACHOL) 40 MG tablet Take 40 mg by mouth at bedtime.     [provider]  ?prazosin (MINIPRESS) 2 MG capsule Take 2-4 mg by mouth in the morning and at bedtime. 1 in the  am and 2 at bedtime 11/29/20   [provider]  ?spironolactone (ALDACTONE) 25 MG tablet Take 0.5 tablets (12.5 mg total) by mouth at bedtime. ?Patient not taking: No sig reported 09/18/19   Skeet Latch, MD  ?TRESIBA FLEXTOUCH 200 UNIT/ML SOPN Inject 16 Units into the skin at bedtime.  02/10/17   [provider]  ?vitamin B-12 (CYANOCOBALAMIN) 1000 MCG tablet Take 1,000 mcg by mouth daily.  01/16/19   [provider]  ?   ? ?Allergies    ?Amlodipine, Angiotensin receptor blockers, Calcium channel blockers, Latex, Nicardipine hcl, Simvastatin, Clonidine, Sulfa antibiotics, Sulfacetamide sodium, and Sulfasalazine   ? ?Review of  Systems   ?Review of Systems ? ?Physical Exam ?Updated Vital Signs ?BP (!) 164/70   Pulse 65   Resp 16   Ht '5\' 4"'$  (1.626 m)   Wt 63.5 kg   SpO2 99%   BMI 24.03 kg/m?  ?Physical Exam ?Vitals and nursing note reviewed.  ?Constitutional:   ?   General: She is not in acute distress. ?   Appearance: She is well-developed. She is not ill-appearing.  ?HENT:  ?   Head: Normocephalic and atraumatic.  ?   Nose: Nose normal.  ?   Mouth/Throat:  ?   Mouth: Mucous membranes are moist.  ?Eyes:  ?   Extraocular Movements: Extraocular movements intact.  ?   Conjunctiva/sclera: Conjunctivae normal.  ?   Pupils: Pupils are equal, round, and reactive to light.  ?Cardiovascular:  ?   Rate and Rhythm: Normal rate and regular rhythm.  ?   Pulses: Normal pulses.  ?   Heart sounds: Normal heart sounds. No murmur heard. ?Pulmonary:  ?   Effort: Pulmonary effort is normal. No respiratory distress.  ?   Breath sounds: Normal breath sounds.  ?Abdominal:  ?   Palpations: Abdomen is soft.  ?   Tenderness: There is no abdominal tenderness.  ?Genitourinary: ?   Comments: There appears to be fairly dark stool dried up around the rectum but unable to get a great stool sample but Hemoccult negative but stool to me appeared to be melanotic ?Musculoskeletal:     ?   General: No swelling.  ?   Cervical back: Neck supple.  ?Skin: ?   General: Skin is warm and dry.  ?   Capillary Refill: Capillary refill takes less than 2 seconds.  ?Neurological:  ?   General: No focal deficit present.  ?   Mental Status: She is alert.  ?Psychiatric:     ?   Mood and Affect: Mood normal.  ? ? ?ED Results / Procedures / Treatments   ?Labs ?(all labs ordered are listed, but only abnormal results are displayed) ?Labs Reviewed  ?CBC WITH DIFFERENTIAL/PLATELET - Abnormal; Notable for the following components:  ?    Result Value  ? RBC 3.50 (*)   ? Hemoglobin 10.9 (*)   ? HCT 34.0 (*)   ? All other components within normal limits  ?COMPREHENSIVE METABOLIC PANEL -  Abnormal; Notable for the following components:  ? CO2 20 (*)   ? Glucose, Bld 117 (*)   ? Creatinine, Ser 1.15 (*)   ? GFR, Estimated 49 (*)   ? All other components within normal limits  ?URINALYSIS, ROUTINE W REFLEX MICROSCOPIC - Abnormal; Notable for the following components:  ? APPearance HAZY (*)   ? Glucose, UA 50 (*)   ? Protein, ur 30 (*)   ? Leukocytes,Ua LARGE (*)   ? WBC, UA >  50 (*)   ? Bacteria, UA RARE (*)   ? All other components within normal limits  ?CBG MONITORING, ED - Abnormal; Notable for the following components:  ? Glucose-Capillary 118 (*)   ? All other components within normal limits  ?URINE CULTURE  ?POC OCCULT BLOOD, ED  ?TROPONIN I (HIGH SENSITIVITY)  ?TROPONIN I (HIGH SENSITIVITY)  ? ? ?EKG ?EKG Interpretation ? ?Date/Time:  Friday Oct 03 2021 18:10:18 EDT ?Ventricular Rate:  55 ?PR Interval:  179 ?QRS Duration: 98 ?QT Interval:  489 ?QTC Calculation: 468 ?R Axis:   43 ?Text Interpretation: Sinus rhythm Borderline T wave abnormalities Confirmed by Lennice Sites [phone]) on 10/03/2021 6:51:52 PM ? ?Radiology ?DG Chest 2 View ? ?Result Date: 10/03/2021 ?CLINICAL DATA:  Near syncope.  Atrial fibrillation. EXAM: CHEST - 2 VIEW COMPARISON:  03/26/2019 FINDINGS: Patient is rotated to the left. The heart size and mediastinal contours are within normal limits. Aortic atherosclerotic calcification noted. Scarring noted in the lingula. The lungs are otherwise clear. No evidence of pleural effusion. IMPRESSION: No active cardiopulmonary disease. Electronically Signed   By: Marlaine Hind M.D.   On: 10/03/2021 18:38  ? ?CT ANGIO GI BLEED ? ?Result Date: 10/03/2021 ?CLINICAL DATA:  GI bleed.  Dark stools. EXAM: CTA ABDOMEN AND PELVIS WITHOUT AND WITH CONTRAST TECHNIQUE: Multidetector CT imaging of the abdomen and pelvis was performed using the standard protocol during bolus administration of intravenous contrast. Multiplanar reconstructed images and MIPs were obtained and reviewed to evaluate the vascular  anatomy. RADIATION DOSE REDUCTION: This exam was performed according to the departmental dose-optimization program which includes automated exposure control, adjustment of the mA and/or kV according to patient size an

## 2021-10-03 NOTE — ED Provider Triage Note (Signed)
Emergency Medicine Provider Triage Evaluation Note ? ?[place] , a 76 y.o. female  was evaluated in triage.  Pt complains of near syncope. Hx of afib on eliquis. Patient went out to put something in her shed and states " I liked to never make it back to my house." She felt like her chest was fluttering. She has had dark stool yesterday and today. She had a similar episode of near syncope 3 days ago. She still feels light headed at rest.  ? ?Review of Systems  ?Positive: Near syncope ?Negative: Abdominal pain  ? ?Physical Exam  ?BP (!) 165/57 (BP Location: Right Arm)   Pulse (!) 58   Resp 16   Ht '5\' 4"'$  (1.626 m)   Wt 63.5 kg   SpO2 99%   BMI 24.03 kg/m?  ?Gen:   Awake, no distress   ?Resp:  Normal effort  ?MSK:   Moves extremities without difficulty  ?Other:  Flushed cheeks ? ?Medical Decision Making  ?Medically screening exam initiated at 6:02 PM.  Appropriate orders placed.  Jamylah W Mayotte was informed that the remainder of the evaluation will be completed by another provider, this initial triage assessment does not replace that evaluation, and the importance of remaining in the ED until their evaluation is complete. ? ? Work up initiated ?  ?Margarita Mail, PA-C ?10/03/21 1804 ? ?

## 2021-10-03 NOTE — ED Notes (Signed)
Called lab and asked to add on a urine culture. Cloyde Reams said she would run that.  ?

## 2021-10-03 NOTE — H&P (Addendum)
?History and Physical ? ?Amber Herman [place] WJX:[id number] DOB: 1945/11/02 DOA: 10/03/2021 ? ?Referring physician: Dr. Ronnald Nian, EDP ?PCP: Renee Rival, NP  ?Outpatient Specialists: Cardiology, orthopedic surgery. ?Patient coming from: Home ? ?Chief Complaint: Dark stools, excessive gas. ? ?HPI: Amber Herman is a 76 y.o. female with medical history significant for essential hypertension, type 2 diabetes, hyperlipidemia, paroxysmal A-fib on Eliquis, chronic anxiety/depression, iron deficiency anemia on iron supplement, who presented to [facility] ED with complaints of dark stools and excessive gas of 1 day duration.  Associated with abdominal cramping and lightheadedness with nearly passing out x 1 today.  No nausea but reportedly poor oral intake.  Had 1 large dark brown loose stool the day before.  In the ED, rectal exam per EDP showed dark stools however FOBT was negative.  CT angio GI bleed revealed no evidence of active extravasation within the GI tract to localize site of GI bleed.  Pancolonic diverticulosis with a questionable area of diverticulitis involving the mid proximal sigmoid, no perforation.  Calcified uterine fibroids.  UA positive for pyuria.  She was started on IV antibiotics empirically in the ED due to possible UTI and possible left-sided diverticulitis.  TRH, hospitalist team, was asked to admit. ? ?ED Course: BP 156/55, pulse 66, respiratory 17, saturation 99% on room air.  Lab studies remarkable for serum bicarb 20, BUN 23, creatinine 1.15, glucose 117.  GFR 49.  WBC 9.1, hemoglobin 10.9.  UA positive for pyuria. ? ?Review of Systems: ?Review of systems as noted in the HPI. All other systems reviewed and are negative. ? ? ?Past Medical History:  ?Diagnosis Date  ? Allergic rhinitis   ? Anemia   ? Atrial fibrillation ([place])   ? Diagnosed 2013  ? DM (diabetes mellitus), type 2 ([place])   ? GERD (gastroesophageal reflux disease)   ? Hyperlipidemia   ? Hypertension   ? Nephrolithiasis   ? Rotator cuff  tear   ? Thyroid disease   ? Transient ischemic attack (TIA)   ? Vitamin D deficiency   ? ?Past Surgical History:  ?Procedure Laterality Date  ? CARDIOVERSION N/A 02/22/2019  ? Procedure: CARDIOVERSION;  Surgeon: Donato Heinz, MD;  Location: [facility] ENDOSCOPY;  Service: Endoscopy;  Laterality: N/A;  ? CHOLECYSTECTOMY    ? COLONOSCOPY    ? normal per patient, more than 10 years ago  ? LEFT HEART CATH AND CORONARY ANGIOGRAPHY N/A 02/21/2019  ? Procedure: LEFT HEART CATH AND CORONARY ANGIOGRAPHY;  Surgeon: [name], MD;  Location: [facility] CV LAB;  Service: Cardiovascular;  Laterality: N/A;  ? TEE WITHOUT CARDIOVERSION N/A 02/22/2019  ? Procedure: TRANSESOPHAGEAL ECHOCARDIOGRAM (TEE);  Surgeon: Donato Heinz, MD;  Location: [facility] ENDOSCOPY;  Service: Endoscopy;  Laterality: N/A;  ? ? ?Social History:  reports that she has never smoked. She has never used smokeless tobacco. She reports that she does not drink alcohol and does not use drugs. ? ? ?Allergies  ?Allergen Reactions  ? Amlodipine   ? Angiotensin Receptor Blockers   ?  Taking losartan at admission  ? Calcium Channel Blockers   ? Latex Hives and Itching  ? Nicardipine Hcl   ? Simvastatin   ? Clonidine Other (See Comments)  ?  Tablet caused GI issues and fatigue  ? Sulfa Antibiotics Itching and Rash  ? Sulfacetamide Sodium Itching and Rash  ? Sulfasalazine Itching and Rash  ? ? ?Family History  ?Problem Relation Age of Onset  ? Hypertension Father   ? Diabetes Father   ?  Heart disease Mother   ? Alzheimer's disease Mother   ? Diabetes Mother   ? Heart disease Brother   ? Colon cancer Neg Hx   ?  ? ? ?Prior to Admission medications   ?Medication Sig Start Date End Date Taking? Authorizing Provider  ?acetaminophen (TYLENOL) 325 MG tablet Take 2 tablets (650 mg total) by mouth every 8 (eight) hours as needed. 11/19/17   Mesner, Corene Cornea, MD  ?amiodarone (PACERONE) 200 MG tablet TAKE 1 TABLET BY MOUTH ONCE DAILY. ?Patient taking differently: Take 200  mg by mouth daily. 03/21/20   Satira Sark, MD  ?apixaban (ELIQUIS) 5 MG TABS tablet Take 1 tablet (5 mg total) by mouth 2 (two) times daily. 11/03/17   Manuella Ghazi, Pratik D, DO  ?carvedilol (COREG) 25 MG tablet Take 1 tablet (25 mg total) by mouth 2 (two) times daily. ?Patient taking differently: Take 6.25 mg by mouth 2 (two) times daily. 08/21/19 12/19/20  Satira Sark, MD  ?escitalopram (LEXAPRO) 5 MG tablet Take 10 mg by mouth in the morning. 11/29/20   [provider]  ?esomeprazole (NEXIUM) 40 MG capsule Take 40 mg by mouth daily before breakfast. 09/05/19   [provider]  ?FEROSUL 325 (65 Fe) MG tablet Take 325 mg by mouth daily.  02/03/19   [provider]  ?HYDROcodone-acetaminophen (NORCO) 5-325 MG tablet Take 1 tablet by mouth every 6 (six) hours as needed for severe pain. 12/19/20   Elnora Morrison, MD  ?isosorbide mononitrate (IMDUR) 30 MG 24 hr tablet Take 1 tablet (30 mg total) by mouth 2 (two) times daily. ?Patient not taking: No sig reported 06/01/19   Satira Sark, MD  ?levothyroxine (SYNTHROID) 75 MCG tablet Take 75 mcg by mouth daily. 11/29/20   [provider]  ?losartan (COZAAR) 50 MG tablet Take 1 tablet (50 mg total) by mouth 2 (two) times daily. 04/19/19 12/19/20  Satira Sark, MD  ?magnesium oxide (MAG-OX) 400 (241.3 Mg) MG tablet TAKE 1 TABLET BY MOUTH ONCE DAILY. ?Patient not taking: No sig reported 06/14/19   Satira Sark, MD  ?metFORMIN (GLUCOPHAGE) 850 MG tablet Take 850 mg by mouth 2 (two) times daily with a meal.  01/16/19   [provider]  ?nitroGLYCERIN (NITROSTAT) 0.4 MG SL tablet Place 1 tablet (0.4 mg total) under the tongue every 5 (five) minutes as needed for chest pain (CP or SOB). 02/23/19   Deatra James, MD  ?ondansetron (ZOFRAN ODT) 4 MG disintegrating tablet '4mg'$  ODT q4 hours prn nausea/vomit 12/19/20   Elnora Morrison, MD  ?pravastatin (PRAVACHOL) 40 MG tablet Take 40 mg by mouth at bedtime.     [provider]  ?prazosin (MINIPRESS) 2 MG capsule Take 2-4 mg by mouth in the morning and at bedtime. 1 in the am and 2 at bedtime 11/29/20   [provider]  ?spironolactone (ALDACTONE) 25 MG tablet Take 0.5 tablets (12.5 mg total) by mouth at bedtime. ?Patient not taking: No sig reported 09/18/19   Skeet Latch, MD  ?TRESIBA FLEXTOUCH 200 UNIT/ML SOPN Inject 16 Units into the skin at bedtime.  02/10/17   [provider]  ?vitamin B-12 (CYANOCOBALAMIN) 1000 MCG tablet Take 1,000 mcg by mouth daily.  01/16/19   [provider]  ? ? ?Physical Exam: ?BP (!) 156/55   Pulse 66   Resp 17   Ht '5\' 4"'$  (1.626 m)   Wt 63.5 kg   SpO2 99%   BMI 24.03 kg/m?  ? ?General:  76 y.o. year-old female well developed well nourished in no acute distress.  Alert and oriented x3. ?Cardiovascular: Regular rate and rhythm with no rubs or gallops.  No thyromegaly or JVD noted.  No lower extremity edema. 2/4 pulses in all 4 extremities. ?Respiratory: Clear to auscultation with no wheezes or rales. Good inspiratory effort. ?Abdomen: Soft Minimal tenderness with palpation at left lower quadrant abdomen nondistended with normal bowel sounds x4 quadrants. ?Muskuloskeletal: No cyanosis, clubbing or edema noted bilaterally ?Neuro: CN II-XII intact, strength, sensation, reflexes ?Skin: No ulcerative lesions noted or rashes ?Psychiatry: Judgement and insight appear normal. Mood is appropriate for condition and setting ?   ?   ?   ?Labs on Admission:  ?Basic Metabolic Panel: ?Recent Labs  ?Lab 10/03/21 ?1815  ?NA 135  ?K 4.3  ?CL 103  ?CO2 20*  ?GLUCOSE 117*  ?BUN 23  ?CREATININE 1.15*  ?CALCIUM 8.9  ? ?Liver Function Tests: ?Recent Labs  ?Lab 10/03/21 ?1815  ?AST 19  ?ALT 17  ?ALKPHOS 51  ?BILITOT 0.4  ?PROT 7.0  ?ALBUMIN 3.7  ? ?No results for input(s): LIPASE, AMYLASE in the last 168 hours. ?No results for input(s): AMMONIA in the last 168 hours. ?CBC: ?Recent Labs  ?Lab 10/03/21 ?1815  ?WBC 9.1  ?NEUTROABS 6.9  ?HGB  10.9*  ?HCT 34.0*  ?MCV 97.1  ?PLT 332  ? ?Cardiac Enzymes: ?No results for input(s): CKTOTAL, CKMB, CKMBINDEX, TROPONINI in the last 168 hours. ? ?BNP (last 3 results) ?No results for input(s): BNP in the last 8760

## 2021-10-04 DIAGNOSIS — D509 Iron deficiency anemia, unspecified: Secondary | ICD-10-CM | POA: Diagnosis present

## 2021-10-04 DIAGNOSIS — I480 Paroxysmal atrial fibrillation: Secondary | ICD-10-CM

## 2021-10-04 DIAGNOSIS — E872 Acidosis, unspecified: Secondary | ICD-10-CM | POA: Diagnosis present

## 2021-10-04 DIAGNOSIS — Z91040 Latex allergy status: Secondary | ICD-10-CM | POA: Diagnosis not present

## 2021-10-04 DIAGNOSIS — R55 Syncope and collapse: Principal | ICD-10-CM | POA: Diagnosis present

## 2021-10-04 DIAGNOSIS — I10 Essential (primary) hypertension: Secondary | ICD-10-CM | POA: Diagnosis present

## 2021-10-04 DIAGNOSIS — E559 Vitamin D deficiency, unspecified: Secondary | ICD-10-CM | POA: Diagnosis present

## 2021-10-04 DIAGNOSIS — I252 Old myocardial infarction: Secondary | ICD-10-CM | POA: Diagnosis not present

## 2021-10-04 DIAGNOSIS — K5792 Diverticulitis of intestine, part unspecified, without perforation or abscess without bleeding: Secondary | ICD-10-CM | POA: Diagnosis not present

## 2021-10-04 DIAGNOSIS — E119 Type 2 diabetes mellitus without complications: Secondary | ICD-10-CM | POA: Diagnosis present

## 2021-10-04 DIAGNOSIS — Z8249 Family history of ischemic heart disease and other diseases of the circulatory system: Secondary | ICD-10-CM | POA: Diagnosis not present

## 2021-10-04 DIAGNOSIS — Z888 Allergy status to other drugs, medicaments and biological substances status: Secondary | ICD-10-CM | POA: Diagnosis not present

## 2021-10-04 DIAGNOSIS — Z7901 Long term (current) use of anticoagulants: Secondary | ICD-10-CM | POA: Diagnosis not present

## 2021-10-04 DIAGNOSIS — N179 Acute kidney failure, unspecified: Secondary | ICD-10-CM | POA: Diagnosis present

## 2021-10-04 DIAGNOSIS — K922 Gastrointestinal hemorrhage, unspecified: Secondary | ICD-10-CM | POA: Diagnosis present

## 2021-10-04 DIAGNOSIS — N390 Urinary tract infection, site not specified: Secondary | ICD-10-CM | POA: Diagnosis present

## 2021-10-04 DIAGNOSIS — Z8673 Personal history of transient ischemic attack (TIA), and cerebral infarction without residual deficits: Secondary | ICD-10-CM | POA: Diagnosis not present

## 2021-10-04 DIAGNOSIS — K219 Gastro-esophageal reflux disease without esophagitis: Secondary | ICD-10-CM | POA: Diagnosis present

## 2021-10-04 DIAGNOSIS — E7800 Pure hypercholesterolemia, unspecified: Secondary | ICD-10-CM | POA: Diagnosis present

## 2021-10-04 DIAGNOSIS — Z820 Family history of epilepsy and other diseases of the nervous system: Secondary | ICD-10-CM | POA: Diagnosis not present

## 2021-10-04 DIAGNOSIS — K921 Melena: Secondary | ICD-10-CM | POA: Diagnosis present

## 2021-10-04 DIAGNOSIS — F32A Depression, unspecified: Secondary | ICD-10-CM | POA: Diagnosis present

## 2021-10-04 DIAGNOSIS — K2971 Gastritis, unspecified, with bleeding: Secondary | ICD-10-CM | POA: Diagnosis not present

## 2021-10-04 DIAGNOSIS — K5733 Diverticulitis of large intestine without perforation or abscess with bleeding: Secondary | ICD-10-CM | POA: Diagnosis present

## 2021-10-04 DIAGNOSIS — K317 Polyp of stomach and duodenum: Secondary | ICD-10-CM | POA: Diagnosis present

## 2021-10-04 DIAGNOSIS — Z882 Allergy status to sulfonamides status: Secondary | ICD-10-CM | POA: Diagnosis not present

## 2021-10-04 DIAGNOSIS — E860 Dehydration: Secondary | ICD-10-CM | POA: Diagnosis present

## 2021-10-04 DIAGNOSIS — Z833 Family history of diabetes mellitus: Secondary | ICD-10-CM | POA: Diagnosis not present

## 2021-10-04 DIAGNOSIS — F419 Anxiety disorder, unspecified: Secondary | ICD-10-CM | POA: Diagnosis present

## 2021-10-04 DIAGNOSIS — Z79899 Other long term (current) drug therapy: Secondary | ICD-10-CM | POA: Diagnosis not present

## 2021-10-04 LAB — CBC WITH DIFFERENTIAL/PLATELET
Abs Immature Granulocytes: 0.02 10*3/uL (ref 0.00–0.07)
Basophils Absolute: 0 10*3/uL (ref 0.0–0.1)
Basophils Relative: 0 %
Eosinophils Absolute: 0.1 10*3/uL (ref 0.0–0.5)
Eosinophils Relative: 1 %
HCT: 30.9 % — ABNORMAL LOW (ref 36.0–46.0)
Hemoglobin: 10.1 g/dL — ABNORMAL LOW (ref 12.0–15.0)
Immature Granulocytes: 0 %
Lymphocytes Relative: 20 %
Lymphs Abs: 1.5 10*3/uL (ref 0.7–4.0)
MCH: 31.6 pg (ref 26.0–34.0)
MCHC: 32.7 g/dL (ref 30.0–36.0)
MCV: 96.6 fL (ref 80.0–100.0)
Monocytes Absolute: 0.7 10*3/uL (ref 0.1–1.0)
Monocytes Relative: 9 %
Neutro Abs: 5.3 10*3/uL (ref 1.7–7.7)
Neutrophils Relative %: 70 %
Platelets: 328 10*3/uL (ref 150–400)
RBC: 3.2 MIL/uL — ABNORMAL LOW (ref 3.87–5.11)
RDW: 13.7 % (ref 11.5–15.5)
WBC: 7.6 10*3/uL (ref 4.0–10.5)
nRBC: 0 % (ref 0.0–0.2)

## 2021-10-04 LAB — HEMOGLOBIN A1C
Hgb A1c MFr Bld: 7.1 % — ABNORMAL HIGH (ref 4.8–5.6)
Mean Plasma Glucose: 157.07 mg/dL

## 2021-10-04 LAB — GLUCOSE, CAPILLARY
Glucose-Capillary: 109 mg/dL — ABNORMAL HIGH (ref 70–99)
Glucose-Capillary: 242 mg/dL — ABNORMAL HIGH (ref 70–99)
Glucose-Capillary: 76 mg/dL (ref 70–99)
Glucose-Capillary: 210 mg/dL — ABNORMAL HIGH (ref 70–99)

## 2021-10-04 LAB — COMPREHENSIVE METABOLIC PANEL
ALT: 14 U/L (ref 0–44)
AST: 14 U/L — ABNORMAL LOW (ref 15–41)
Albumin: 3.5 g/dL (ref 3.5–5.0)
Alkaline Phosphatase: 52 U/L (ref 38–126)
Anion gap: 9 (ref 5–15)
BUN: 16 mg/dL (ref 8–23)
CO2: 26 mmol/L (ref 22–32)
Calcium: 8.7 mg/dL — ABNORMAL LOW (ref 8.9–10.3)
Chloride: 102 mmol/L (ref 98–111)
Creatinine, Ser: 0.9 mg/dL (ref 0.44–1.00)
GFR, Estimated: >60 mL/min (ref >60)
Glucose, Bld: 104 mg/dL — ABNORMAL HIGH (ref 70–99)
Potassium: 3.8 mmol/L (ref 3.5–5.1)
Sodium: 137 mmol/L (ref 135–145)
Total Bilirubin: 0.5 mg/dL (ref 0.3–1.2)
Total Protein: 6.7 g/dL (ref 6.5–8.1)

## 2021-10-04 LAB — MAGNESIUM: Magnesium: 1.7 mg/dL (ref 1.7–2.4)

## 2021-10-04 LAB — PHOSPHORUS: Phosphorus: 3.4 mg/dL (ref 2.5–4.6)

## 2021-10-04 MED ORDER — INSULIN ASPART 100 UNIT/ML IJ SOLN
0.0000 [IU] | Freq: Three times a day (TID) | INTRAMUSCULAR | Status: DC
  Administered 2021-10-05: 8 [IU] via SUBCUTANEOUS
  Administered 2021-10-06: 5 [IU] via SUBCUTANEOUS

## 2021-10-04 MED ORDER — LACTATED RINGERS IV SOLN: INTRAVENOUS | Status: DC

## 2021-10-04 MED ORDER — PEG 3350-KCL-NA BICARB-NACL 420 G PO SOLR
4000.0000 mL | Freq: Once | ORAL | Status: DC
  Filled 2021-10-04: qty 4000

## 2021-10-04 MED ORDER — HYDRALAZINE HCL 20 MG/ML IJ SOLN
10.0000 mg | Freq: Four times a day (QID) | INTRAMUSCULAR | Status: DC | PRN
Start: 2021-10-04 — End: 2021-10-06

## 2021-10-04 MED ORDER — CARVEDILOL 6.25 MG PO TABS: 6.2500 mg | ORAL_TABLET | Freq: Two times a day (BID) | ORAL | Status: DC

## 2021-10-04 MED ORDER — HYDRALAZINE HCL 10 MG PO TABS
10.0000 mg | ORAL_TABLET | Freq: Three times a day (TID) | ORAL | Status: DC
  Administered 2021-10-04: 10 mg via ORAL
  Filled 2021-10-04: qty 1

## 2021-10-04 MED ORDER — AMLODIPINE BESYLATE 5 MG PO TABS: 5.0000 mg | ORAL_TABLET | Freq: Every day | ORAL | Status: DC

## 2021-10-04 MED ORDER — HYDRALAZINE HCL 25 MG PO TABS
25.0000 mg | ORAL_TABLET | Freq: Three times a day (TID) | ORAL | Status: DC
Start: 2021-10-04 — End: 2021-10-05
  Administered 2021-10-04 – 2021-10-05 (×3): 25 mg via ORAL
  Filled 2021-10-04 (×3): qty 1

## 2021-10-04 MED ORDER — SIMETHICONE 80 MG PO CHEW
80.0000 mg | CHEWABLE_TABLET | Freq: Four times a day (QID) | ORAL | Status: DC | PRN
  Administered 2021-10-04 – 2021-10-05 (×2): 80 mg via ORAL
  Filled 2021-10-04 (×2): qty 1

## 2021-10-04 MED ORDER — LACTATED RINGERS IV SOLN: INTRAVENOUS | Status: AC

## 2021-10-04 MED ORDER — SODIUM CHLORIDE 0.9 % IV SOLN
2.0000 g | INTRAVENOUS | Status: DC
  Administered 2021-10-04 – 2021-10-05 (×2): 2 g via INTRAVENOUS
  Filled 2021-10-04 (×2): qty 20

## 2021-10-04 MED ORDER — ISOSORBIDE MONONITRATE ER 30 MG PO TB24
30.0000 mg | ORAL_TABLET | Freq: Two times a day (BID) | ORAL | Status: DC
  Administered 2021-10-04 – 2021-10-06 (×5): 30 mg via ORAL
  Filled 2021-10-04 (×5): qty 1

## 2021-10-04 NOTE — Progress Notes (Signed)
?      ? ? ? [facility] ?                                                                           ? ? ?Amber Herman, is a 76 y.o. female, DOB - 07-Jun-1945, INO:[id number] ?Admit date - 10/03/2021    ?Outpatient Primary MD for the patient is Amber Rival, NP ? ?LOS - 0  days ? ?Chief Complaint  ?Patient presents with  ? Near Syncope  ? dark stools  ?    ? ?Brief summary  ? ?Patient is a 76 year old female with essential hypertension, DM type II, HLP, paroxysmal atrial fibrillation on Eliquis, chronic anxiety/depression, iron deficiency anemia on iron supplement presented to ED with dark stools and excessive flatulence for 1 day.  No nausea however reported poor oral intake.  Had 1 large dark brown loose stool a day before.  In ED rectal exam showed dark stools FOBT negative. ?CT angio GI bleed showed no evidence of active extravasation within the GI tract to localize site.  Pancolonic diverticulosis with questionable area of diverticulitis involving mid to proximal sigmoid, no perforation. ?UA positive for pyuria, started on IV antibiotics empirically for diverticulitis. ?Admitted for further work-up. ?Hemoglobin 10.9 ? ?Assessment & Plan  ? ? ?Principal Problem: ?  Upper GI bleed with chronic normocytic anemia ?-Presented with dark stools, intermittent abdominal pain for last 2 months.  Has history of atrial fibrillation on Eliquis, last dose on 5/5 AM, ?-Hold Eliquis.  GI consulted. ?-Plan for endoscopy in a.m., n.p.o. after midnight, continue full liquid diet today. ?-Colonoscopy contraindicated due to active diverticulitis. ? ? ?Active Problems: ?Acute diverticulitis ?-Currently no acute abdominal pain, continue IV Rocephin and Flagyl ?-Full liquid diet today, coloscopy contraindicated ? ?Pyuria, ?  UTI ?-Follow urine culture and sensitivities, continue IV Rocephin ? ?Acute kidney injury with mild non-anion gap metabolic acidosis ?-Prerenal in setting of poor oral intake, diarrhea ?-Presented with  creatinine of 1.15, baseline 0.7 ?-Continue IV fluid hydration, creatinine improved ? ?Near syncope ?-Likely secondary to orthostatic hypotension, orthostatics positive in ED ?-Continue gentle hydration, hold losartan ?-BP now stable, resume Imdur, ?-Coreg was held due to bradycardia ? ?Paroxysmal atrial fibrillation ?-Currently rate controlled, bradycardia.   ?-Coreg on hold.  Continue amiodarone ?-Eliquis on hold ? ?  Diabetes mellitus, type II (Amber Herman) ?-Continue to hold metformin, ?-Placed on sliding scale insulin while inpatient, moderate ?-Hemoglobin A1c 7.1 ? ? ? ? ?Code Status: Full CODE STATUS ?DVT Prophylaxis:    SCDs ? ? ?Level of Care: Level of care: Telemetry ?Family Communication: Updated patient ? ? ?Disposition Plan:      Remains inpatient appropriate: Plan for EGD in a.m. ? ? ?Procedures:  ?None ? ?Consultants:   ?Gastroenterology ? ?Antimicrobials:  ? ?Anti-infectives (From admission, onward)  ? ? Start     Dose/Rate Route Frequency Ordered Stop  ? 10/04/21 1000  metroNIDAZOLE (FLAGYL) IVPB 500 mg       ? 500 mg ?100 mL/hr over 60 Minutes Intravenous Every 12 hours 10/03/21 2227    ? 10/03/21 2100  cefTRIAXone (ROCEPHIN) 2 g in sodium chloride 0.9 % 100 mL IVPB       ?See Hyperspace for  full Linked Orders Report.  ? 2 g ?200 mL/hr over 30 Minutes Intravenous  Once 10/03/21 2050 10/03/21 2229  ? 10/03/21 2100  metroNIDAZOLE (FLAGYL) IVPB 500 mg       ?See Hyperspace for full Linked Orders Report.  ? 500 mg ?100 mL/hr over 60 Minutes Intravenous  Once 10/03/21 2050 10/04/21 0059  ? ?  ? ? ? ? ? ?Medications ? amiodarone  200 mg Oral Daily  ? hydrALAZINE  25 mg Oral Q8H  ? insulin aspart  0-5 Units Subcutaneous QHS  ? insulin aspart  0-9 Units Subcutaneous TID WC  ? isosorbide mononitrate  30 mg Oral BID  ? levothyroxine  100 mcg Oral Q0600  ? pantoprazole (PROTONIX) IV  40 mg Intravenous BID  ? ? ? ? ?Subjective:  ? ?Amber Herman was seen and examined today.  No acute complaints.  BP uncontrolled.  No  further bleeding since admission.  No fevers. ? ?Objective:  ? ?Vitals:  ? 10/03/21 2250 10/04/21 0322 10/04/21 0957 10/04/21 1031  ?BP: (!) 164/48 (!) 162/60 (!) 195/58 (!) 191/55  ?Pulse: (!) 54 (!) 55 (!) 59 63  ?Resp: 17 16    ?Temp: 97.7 ?F (36.5 ?C) 97.8 ?F (36.6 ?C) 98.1 ?F (36.7 ?C)   ?TempSrc: Oral Oral Oral   ?SpO2: 99% 98% 100%   ?Weight:      ?Height:      ? ? ?Intake/Output Summary (Last 24 hours) at 10/04/2021 1225 ?Last data filed at 10/04/2021 0930 ?Gross per 24 hour  ?Intake 775.13 ml  ?Output 800 ml  ?Net -24.87 ml  ? ? ? ?Wt Readings from Last 3 Encounters:  ?10/03/21 63.5 kg  ?12/19/20 63.5 kg  ?09/13/19 65.3 kg  ? ? ? ?Exam ?General: Alert and oriented x 3, NAD ?Cardiovascular: S1 S2 auscultated,  RRR ?Respiratory: Clear to auscultation bilaterally, no wheezing ?Gastrointestinal: Soft, mild TTP lower abdomen, nondistended, + bowel sounds ?Ext: no pedal edema bilaterally ?Neuro: Strength 5/5 upper and lower extremities bilaterally ?Psych: Normal affect and demeanor, alert and oriented x3  ? ? ? ?Data Reviewed:  I have personally reviewed following labs  ? ? ?CBC ?Lab Results  ?Component Value Date  ? WBC 7.6 10/04/2021  ? RBC 3.20 (L) 10/04/2021  ? HGB 10.1 (L) 10/04/2021  ? HCT 30.9 (L) 10/04/2021  ? MCV 96.6 10/04/2021  ? MCH 31.6 10/04/2021  ? PLT 328 10/04/2021  ? MCHC 32.7 10/04/2021  ? RDW 13.7 10/04/2021  ? LYMPHSABS 1.5 10/04/2021  ? MONOABS 0.7 10/04/2021  ? EOSABS 0.1 10/04/2021  ? BASOSABS 0.0 10/04/2021  ? ? ? ?Last metabolic panel ?Lab Results  ?Component Value Date  ? NA 137 10/04/2021  ? K 3.8 10/04/2021  ? CL 102 10/04/2021  ? CO2 26 10/04/2021  ? BUN 16 10/04/2021  ? CREATININE 0.90 10/04/2021  ? GLUCOSE 104 (H) 10/04/2021  ? GFRNONAA >60 10/04/2021  ? GFRAA >60 08/18/2019  ? CALCIUM 8.7 (L) 10/04/2021  ? PHOS 3.4 10/04/2021  ? PROT 6.7 10/04/2021  ? ALBUMIN 3.5 10/04/2021  ? BILITOT 0.5 10/04/2021  ? ALKPHOS 52 10/04/2021  ? AST 14 (L) 10/04/2021  ? ALT 14 10/04/2021  ? ANIONGAP 9  10/04/2021  ? ? ?CBG (last 3)  ?Recent Labs  ?  10/03/21 ?1807 10/03/21 ?2254 10/04/21 ?0738  ?GLUCAP 118* 101* 109*  ?  ? ? ?Coagulation Profile: ?No results for input(s): INR, PROTIME in the last 168 hours. ? ? ?Radiology Studies: I have personally reviewed  the imaging studies  ?DG Chest 2 View ? ?Result Date: 10/03/2021 ?CLINICAL DATA:  Near syncope.  Atrial fibrillation. EXAM: CHEST - 2 VIEW IMPRESSION: No active cardiopulmonary disease. Electronically Signed   By: Marlaine Hind M.D.   On: 10/03/2021 18:38  ? ?CT ANGIO GI BLEED ? ?Result Date: 10/03/2021 ?CLINICAL DATA:  GI bleed.  Dark stools. EXAM: CTA ABDOMEN AND PELVIS IMPRESSION: VASCULAR 1. No evidence of active extravasation within the GI tract to localize site of GI bleed. 2. Atherosclerosis of the abdominal aorta and its branches. NON-VASCULAR 1. Pancolonic diverticulosis with a questionable area of diverticulitis involving the mid proximal sigmoid. No perforation. 2. Calcified uterine fibroids. Electronically Signed   By: Keith Rake M.D.   On: 10/03/2021 20:38   ? ? ? ? ?Estill Cotta M.D. ?[facility] ?10/04/2021, 12:25 PM ? ?Available via Epic secure chat 7am-7pm ?After 7 pm, please refer to night coverage provider listed on amion. ? ? ?

## 2021-10-04 NOTE — Consult Note (Signed)
Referring Provider:  EDP ?Primary Care Physician:  Renee Rival, NP ?Primary Gastroenterologist: [facility] ? ?Reason for Consultation: Anemia, dark stools ? ?HPI: Amber Herman is a 76 y.o. female with past medical history of atrial fibrillation on Eliquis, hypertension, diabetes, history of TIA and history of chronic anemia presented to the hospital with syncopal episode.  Was also complaining of dark stools.  Upon initial evaluation, she was found to have stable hemoglobin of 10.9.  She is stable since October 2020.  Normal LFTs.  Occult blood was negative.  CT angio GI bleed protocol yesterday showed questionable area of sigmoid diverticulitis.  No evidence of GI bleed.  GI is consulted for further evaluation. ? ?Patient seen and examined at bedside.  She is complaining of intermittent dark and black-colored stool for last 2 months.  Denies seeing any fresh blood in the stool.  Complaining of intermittent generalized abdominal cramps.  Denies any nausea or vomiting.  Had some diarrhea for last few weeks which has resolved now. ? ?Colonoscopy around 10 years ago.  No previous EGD. ? ?Past Medical History:  ?Diagnosis Date  ? Allergic rhinitis   ? Anemia   ? Atrial fibrillation ([place])   ? Diagnosed 2013  ? DM (diabetes mellitus), type 2 ([place])   ? GERD (gastroesophageal reflux disease)   ? Hyperlipidemia   ? Hypertension   ? Nephrolithiasis   ? Rotator cuff tear   ? Thyroid disease   ? Transient ischemic attack (TIA)   ? Vitamin D deficiency   ? ? ?Past Surgical History:  ?Procedure Laterality Date  ? CARDIOVERSION N/A 02/22/2019  ? Procedure: CARDIOVERSION;  Surgeon: Donato Heinz, MD;  Location: [facility] ENDOSCOPY;  Service: Endoscopy;  Laterality: N/A;  ? CHOLECYSTECTOMY    ? COLONOSCOPY    ? normal per patient, more than 10 years ago  ? LEFT HEART CATH AND CORONARY ANGIOGRAPHY N/A 02/21/2019  ? Procedure: LEFT HEART CATH AND CORONARY ANGIOGRAPHY;  Surgeon: [name], MD;  Location:  [place] [facility];  Service: Cardiovascular;  Laterality: N/A;  ? TEE WITHOUT CARDIOVERSION N/A 02/22/2019  ? Procedure: TRANSESOPHAGEAL ECHOCARDIOGRAM (TEE);  Surgeon: Donato Heinz, MD;  Location[facility] ENDOSCOPY;  Service: Endoscopy;  Laterality: N/A;  ? ? ?Prior to Admission medications   ?Medication Sig Start Date End Date Taking? Authorizing Provider  ?acetaminophen (TYLENOL) 325 MG tablet Take 2 tablets (650 mg total) by mouth every 8 (eight) hours as needed. 11/19/17   Mesner, Corene Cornea, MD  ?amiodarone (PACERONE) 200 MG tablet TAKE 1 TABLET BY MOUTH ONCE DAILY. ?Patient taking differently: Take 200 mg by mouth daily. 03/21/20   Satira Sark, MD  ?apixaban (ELIQUIS) 5 MG TABS tablet Take 1 tablet (5 mg total) by mouth 2 (two) times daily. 11/03/17   Manuella Ghazi, Pratik D, DO  ?carvedilol (COREG) 25 MG tablet Take 1 tablet (25 mg total) by mouth 2 (two) times daily. ?Patient taking differently: Take 6.25 mg by mouth 2 (two) times daily. 08/21/19 12/19/20  Satira Sark, MD  ?escitalopram (LEXAPRO) 5 MG tablet Take 10 mg by mouth in the morning. 11/29/20   [provider]  ?esomeprazole (NEXIUM) 40 MG capsule Take 40 mg by mouth daily before breakfast. 09/05/19   [provider]  ?FEROSUL 325 (65 Fe) MG tablet Take 325 mg by mouth daily.  02/03/19   [provider]  ?HYDROcodone-acetaminophen (NORCO) 5-325 MG tablet Take 1 tablet by mouth every 6 (six) hours as needed for severe pain.  12/19/20   Elnora Morrison, MD  ?isosorbide mononitrate (IMDUR) 30 MG 24 hr tablet Take 1 tablet (30 mg total) by mouth 2 (two) times daily. ?Patient not taking: No sig reported 06/01/19   Satira Sark, MD  ?levothyroxine (SYNTHROID) 75 MCG tablet Take 75 mcg by mouth daily. 11/29/20   [provider]  ?losartan (COZAAR) 50 MG tablet Take 1 tablet (50 mg total) by mouth 2 (two) times daily. 04/19/19 12/19/20  Satira Sark, MD  ?magnesium oxide (MAG-OX) 400 (241.3 Mg) MG tablet TAKE 1 TABLET  BY MOUTH ONCE DAILY. ?Patient not taking: No sig reported 06/14/19   Satira Sark, MD  ?metFORMIN (GLUCOPHAGE) 850 MG tablet Take 850 mg by mouth 2 (two) times daily with a meal.  01/16/19   [provider]  ?nitroGLYCERIN (NITROSTAT) 0.4 MG SL tablet Place 1 tablet (0.4 mg total) under the tongue every 5 (five) minutes as needed for chest pain (CP or SOB). 02/23/19   Deatra James, MD  ?ondansetron (ZOFRAN ODT) 4 MG disintegrating tablet '4mg'$  ODT q4 hours prn nausea/vomit 12/19/20   Elnora Morrison, MD  ?pravastatin (PRAVACHOL) 40 MG tablet Take 40 mg by mouth at bedtime.     [provider]  ?prazosin (MINIPRESS) 2 MG capsule Take 2-4 mg by mouth in the morning and at bedtime. 1 in the am and 2 at bedtime 11/29/20   [provider]  ?spironolactone (ALDACTONE) 25 MG tablet Take 0.5 tablets (12.5 mg total) by mouth at bedtime. ?Patient not taking: No sig reported 09/18/19   Skeet Latch, MD  ?TRESIBA FLEXTOUCH 200 UNIT/ML SOPN Inject 16 Units into the skin at bedtime.  02/10/17   [provider]  ?vitamin B-12 (CYANOCOBALAMIN) 1000 MCG tablet Take 1,000 mcg by mouth daily.  01/16/19   [provider]  ? ? ?Scheduled Meds: ? amiodarone  200 mg Oral Daily  ? hydrALAZINE  10 mg Oral Q8H  ? insulin aspart  0-5 Units Subcutaneous QHS  ? insulin aspart  0-9 Units Subcutaneous TID WC  ? levothyroxine  100 mcg Oral Q0600  ? pantoprazole (PROTONIX) IV  40 mg Intravenous BID  ? ?Continuous Infusions: ? lactated ringers 50 mL/hr at 10/04/21 0311  ? lactated ringers Stopped (10/04/21 0145)  ? metronidazole    ? ?PRN Meds:.acetaminophen, melatonin, ondansetron (ZOFRAN) IV, polyethylene glycol, simethicone ? ?Allergies as of 10/03/2021 - Review Complete 10/03/2021  ?Allergen Reaction Noted  ? Amlodipine  11/25/2011  ? Angiotensin receptor blockers  11/25/2011  ? Calcium channel blockers  11/25/2011  ? Latex Hives and Itching 02/21/2019  ? Nicardipine hcl  09/18/2014  ?  Simvastatin  11/25/2011  ? Clonidine Other (See Comments) 08/18/2019  ? Sulfa antibiotics Itching and Rash 11/25/2011  ? Sulfacetamide sodium Itching and Rash 09/18/2014  ? Sulfasalazine Itching and Rash 11/25/2011  ? ? ?Family History  ?Problem Relation Age of Onset  ? Hypertension Father   ? Diabetes Father   ? Heart disease Mother   ? Alzheimer's disease Mother   ? Diabetes Mother   ? Heart disease Brother   ? Colon cancer Neg Hx   ? ? ?Social History  ? ?Socioeconomic History  ? Marital status: Widowed  ?  Spouse name: Not on file  ? Number of children: Not on file  ? Years of education: Not on file  ? Highest education level: Not on file  ?Occupational History  ? Not on file  ?Tobacco Use  ? Smoking status: Never  ?  Smokeless tobacco: Never  ?Vaping Use  ? Vaping Use: Never used  ?Substance and Sexual Activity  ? Alcohol use: No  ? Drug use: No  ? Sexual activity: Not Currently  ?Other Topics Concern  ? Not on file  ?Social History Narrative  ? Not on file  ? ?Social Determinants of Health  ? ?Financial Resource Strain: Not on file  ?Food Insecurity: Not on file  ?Transportation Needs: Not on file  ?Physical Activity: Not on file  ?Stress: Not on file  ?Social Connections: Not on file  ?Intimate Partner Violence: Not on file  ? ? ?Review of Systems: All negative except as stated above in HPI. ? ?Physical Exam: ?Vital signs: ?Vitals:  ? 10/03/21 2250 10/04/21 0322  ?BP: (!) 164/48 (!) 162/60  ?Pulse: (!) 54 (!) 55  ?Resp: 17 16  ?Temp: 97.7 ?F (36.5 ?C) 97.8 ?F (36.6 ?C)  ?SpO2: 99% 98%  ? ?Last BM Date : 10/03/21 ?General:   Elderly patient, not in acute distress ?Lungs:  Clear throughout to auscultation.   No wheezes, crackles, or rhonchi. No acute distress. ?Heart: Regular heart rate, ?Abdomen: Soft, nontender, nondistended, bowel sounds present, no peritoneal signs ?Neuro -alert and oriented x3 ?Psych -mood and affect normal ?Rectal:  Deferred ? ?GI:  ?Lab Results: ?Recent Labs  ?  10/03/21 ?1815  10/04/21 ?9458  ?WBC 9.1 7.6  ?HGB 10.9* 10.1*  ?HCT 34.0* 30.9*  ?PLT 332 328  ? ?BMET ?Recent Labs  ?  10/03/21 ?1815 10/04/21 ?5929  ?NA 135 137  ?K 4.3 3.8  ?CL 103 102  ?CO2 20* 26  ?GLUCOSE 117* 104*  ?BUN 23 16  ?

## 2021-10-04 NOTE — H&P (View-Only) (Signed)
Referring Provider:  EDP ?Primary Care Physician:  Renee Rival, NP ?Primary Gastroenterologist: [facility] ? ?Reason for Consultation: Anemia, dark stools ? ?HPI: Amber Herman is a 76 y.o. female with past medical history of atrial fibrillation on Eliquis, hypertension, diabetes, history of TIA and history of chronic anemia presented to the hospital with syncopal episode.  Was also complaining of dark stools.  Upon initial evaluation, she was found to have stable hemoglobin of 10.9.  She is stable since October 2020.  Normal LFTs.  Occult blood was negative.  CT angio GI bleed protocol yesterday showed questionable area of sigmoid diverticulitis.  No evidence of GI bleed.  GI is consulted for further evaluation. ? ?Patient seen and examined at bedside.  She is complaining of intermittent dark and black-colored stool for last 2 months.  Denies seeing any fresh blood in the stool.  Complaining of intermittent generalized abdominal cramps.  Denies any nausea or vomiting.  Had some diarrhea for last few weeks which has resolved now. ? ?Colonoscopy around 10 years ago.  No previous EGD. ? ?Past Medical History:  ?Diagnosis Date  ? Allergic rhinitis   ? Anemia   ? Atrial fibrillation ([place])   ? Diagnosed 2013  ? DM (diabetes mellitus), type 2 ([place])   ? GERD (gastroesophageal reflux disease)   ? Hyperlipidemia   ? Hypertension   ? Nephrolithiasis   ? Rotator cuff tear   ? Thyroid disease   ? Transient ischemic attack (TIA)   ? Vitamin D deficiency   ? ? ?Past Surgical History:  ?Procedure Laterality Date  ? CARDIOVERSION N/A 02/22/2019  ? Procedure: CARDIOVERSION;  Surgeon: Donato Heinz, MD;  Location[facility] ENDOSCOPY;  Service: Endoscopy;  Laterality: N/A;  ? CHOLECYSTECTOMY    ? COLONOSCOPY    ? normal per patient, more than 10 years ago  ? LEFT HEART CATH AND CORONARY ANGIOGRAPHY N/A 02/21/2019  ? Procedure: LEFT HEART CATH AND CORONARY ANGIOGRAPHY;  Surgeon: [name], MD;  Location:  [place] [facility] LAB;  Service: Cardiovascular;  Laterality: N/A;  ? TEE WITHOUT CARDIOVERSION N/A 02/22/2019  ? Procedure: TRANSESOPHAGEAL ECHOCARDIOGRAM (TEE);  Surgeon: Donato Heinz, MD;  Location[facility] ENDOSCOPY;  Service: Endoscopy;  Laterality: N/A;  ? ? ?Prior to Admission medications   ?Medication Sig Start Date End Date Taking? Authorizing Provider  ?acetaminophen (TYLENOL) 325 MG tablet Take 2 tablets (650 mg total) by mouth every 8 (eight) hours as needed. 11/19/17   Mesner, Corene Cornea, MD  ?amiodarone (PACERONE) 200 MG tablet TAKE 1 TABLET BY MOUTH ONCE DAILY. ?Patient taking differently: Take 200 mg by mouth daily. 03/21/20   Satira Sark, MD  ?apixaban (ELIQUIS) 5 MG TABS tablet Take 1 tablet (5 mg total) by mouth 2 (two) times daily. 11/03/17   Manuella Ghazi, Pratik D, DO  ?carvedilol (COREG) 25 MG tablet Take 1 tablet (25 mg total) by mouth 2 (two) times daily. ?Patient taking differently: Take 6.25 mg by mouth 2 (two) times daily. 08/21/19 12/19/20  Satira Sark, MD  ?escitalopram (LEXAPRO) 5 MG tablet Take 10 mg by mouth in the morning. 11/29/20   [provider]  ?esomeprazole (NEXIUM) 40 MG capsule Take 40 mg by mouth daily before breakfast. 09/05/19   [provider]  ?FEROSUL 325 (65 Fe) MG tablet Take 325 mg by mouth daily.  02/03/19   [provider]  ?HYDROcodone-acetaminophen (NORCO) 5-325 MG tablet Take 1 tablet by mouth every 6 (six) hours as needed for severe pain.  12/19/20   Elnora Morrison, MD  ?isosorbide mononitrate (IMDUR) 30 MG 24 hr tablet Take 1 tablet (30 mg total) by mouth 2 (two) times daily. ?Patient not taking: No sig reported 06/01/19   Satira Sark, MD  ?levothyroxine (SYNTHROID) 75 MCG tablet Take 75 mcg by mouth daily. 11/29/20   [provider]  ?losartan (COZAAR) 50 MG tablet Take 1 tablet (50 mg total) by mouth 2 (two) times daily. 04/19/19 12/19/20  Satira Sark, MD  ?magnesium oxide (MAG-OX) 400 (241.3 Mg) MG tablet TAKE 1 TABLET  BY MOUTH ONCE DAILY. ?Patient not taking: No sig reported 06/14/19   Satira Sark, MD  ?metFORMIN (GLUCOPHAGE) 850 MG tablet Take 850 mg by mouth 2 (two) times daily with a meal.  01/16/19   [provider]  ?nitroGLYCERIN (NITROSTAT) 0.4 MG SL tablet Place 1 tablet (0.4 mg total) under the tongue every 5 (five) minutes as needed for chest pain (CP or SOB). 02/23/19   Deatra James, MD  ?ondansetron (ZOFRAN ODT) 4 MG disintegrating tablet '4mg'$  ODT q4 hours prn nausea/vomit 12/19/20   Elnora Morrison, MD  ?pravastatin (PRAVACHOL) 40 MG tablet Take 40 mg by mouth at bedtime.     [provider]  ?prazosin (MINIPRESS) 2 MG capsule Take 2-4 mg by mouth in the morning and at bedtime. 1 in the am and 2 at bedtime 11/29/20   [provider]  ?spironolactone (ALDACTONE) 25 MG tablet Take 0.5 tablets (12.5 mg total) by mouth at bedtime. ?Patient not taking: No sig reported 09/18/19   Skeet Latch, MD  ?TRESIBA FLEXTOUCH 200 UNIT/ML SOPN Inject 16 Units into the skin at bedtime.  02/10/17   [provider]  ?vitamin B-12 (CYANOCOBALAMIN) 1000 MCG tablet Take 1,000 mcg by mouth daily.  01/16/19   [provider]  ? ? ?Scheduled Meds: ? amiodarone  200 mg Oral Daily  ? hydrALAZINE  10 mg Oral Q8H  ? insulin aspart  0-5 Units Subcutaneous QHS  ? insulin aspart  0-9 Units Subcutaneous TID WC  ? levothyroxine  100 mcg Oral Q0600  ? pantoprazole (PROTONIX) IV  40 mg Intravenous BID  ? ?Continuous Infusions: ? lactated ringers 50 mL/hr at 10/04/21 0311  ? lactated ringers Stopped (10/04/21 0145)  ? metronidazole    ? ?PRN Meds:.acetaminophen, melatonin, ondansetron (ZOFRAN) IV, polyethylene glycol, simethicone ? ?Allergies as of 10/03/2021 - Review Complete 10/03/2021  ?Allergen Reaction Noted  ? Amlodipine  11/25/2011  ? Angiotensin receptor blockers  11/25/2011  ? Calcium channel blockers  11/25/2011  ? Latex Hives and Itching 02/21/2019  ? Nicardipine hcl  09/18/2014  ?  Simvastatin  11/25/2011  ? Clonidine Other (See Comments) 08/18/2019  ? Sulfa antibiotics Itching and Rash 11/25/2011  ? Sulfacetamide sodium Itching and Rash 09/18/2014  ? Sulfasalazine Itching and Rash 11/25/2011  ? ? ?Family History  ?Problem Relation Age of Onset  ? Hypertension Father   ? Diabetes Father   ? Heart disease Mother   ? Alzheimer's disease Mother   ? Diabetes Mother   ? Heart disease Brother   ? Colon cancer Neg Hx   ? ? ?Social History  ? ?Socioeconomic History  ? Marital status: Widowed  ?  Spouse name: Not on file  ? Number of children: Not on file  ? Years of education: Not on file  ? Highest education level: Not on file  ?Occupational History  ? Not on file  ?Tobacco Use  ? Smoking status: Never  ?  Smokeless tobacco: Never  ?Vaping Use  ? Vaping Use: Never used  ?Substance and Sexual Activity  ? Alcohol use: No  ? Drug use: No  ? Sexual activity: Not Currently  ?Other Topics Concern  ? Not on file  ?Social History Narrative  ? Not on file  ? ?Social Determinants of Health  ? ?Financial Resource Strain: Not on file  ?Food Insecurity: Not on file  ?Transportation Needs: Not on file  ?Physical Activity: Not on file  ?Stress: Not on file  ?Social Connections: Not on file  ?Intimate Partner Violence: Not on file  ? ? ?Review of Systems: All negative except as stated above in HPI. ? ?Physical Exam: ?Vital signs: ?Vitals:  ? 10/03/21 2250 10/04/21 0322  ?BP: (!) 164/48 (!) 162/60  ?Pulse: (!) 54 (!) 55  ?Resp: 17 16  ?Temp: 97.7 ?F (36.5 ?C) 97.8 ?F (36.6 ?C)  ?SpO2: 99% 98%  ? ?Last BM Date : 10/03/21 ?General:   Elderly patient, not in acute distress ?Lungs:  Clear throughout to auscultation.   No wheezes, crackles, or rhonchi. No acute distress. ?Heart: Regular heart rate, ?Abdomen: Soft, nontender, nondistended, bowel sounds present, no peritoneal signs ?Neuro -alert and oriented x3 ?Psych -mood and affect normal ?Rectal:  Deferred ? ?GI:  ?Lab Results: ?Recent Labs  ?  10/03/21 ?1815  10/04/21 ?0981  ?WBC 9.1 7.6  ?HGB 10.9* 10.1*  ?HCT 34.0* 30.9*  ?PLT 332 328  ? ?BMET ?Recent Labs  ?  10/03/21 ?1815 10/04/21 ?1914  ?NA 135 137  ?K 4.3 3.8  ?CL 103 102  ?CO2 20* 26  ?GLUCOSE 117* 104*  ?BUN 23 16  ?

## 2021-10-05 ENCOUNTER — Encounter (HOSPITAL_COMMUNITY): Payer: Self-pay | Admitting: Internal Medicine

## 2021-10-05 ENCOUNTER — Inpatient Hospital Stay (HOSPITAL_COMMUNITY): Payer: Medicare Other | Admitting: Certified Registered Nurse Anesthetist

## 2021-10-05 ENCOUNTER — Encounter (HOSPITAL_COMMUNITY)
Admission: EM | Disposition: A | Discharge status: Home / Self Care | Payer: Self-pay | Source: Home / Self Care | Attending: Internal Medicine

## 2021-10-05 DIAGNOSIS — K2971 Gastritis, unspecified, with bleeding: Secondary | ICD-10-CM

## 2021-10-05 DIAGNOSIS — K5792 Diverticulitis of intestine, part unspecified, without perforation or abscess without bleeding: Secondary | ICD-10-CM | POA: Diagnosis not present

## 2021-10-05 DIAGNOSIS — I252 Old myocardial infarction: Secondary | ICD-10-CM

## 2021-10-05 DIAGNOSIS — N179 Acute kidney failure, unspecified: Secondary | ICD-10-CM | POA: Diagnosis not present

## 2021-10-05 DIAGNOSIS — K317 Polyp of stomach and duodenum: Secondary | ICD-10-CM

## 2021-10-05 DIAGNOSIS — I10 Essential (primary) hypertension: Secondary | ICD-10-CM

## 2021-10-05 DIAGNOSIS — K922 Gastrointestinal hemorrhage, unspecified: Secondary | ICD-10-CM | POA: Diagnosis not present

## 2021-10-05 HISTORY — PX: BIOPSY: SHX5522

## 2021-10-05 HISTORY — PX: ESOPHAGOGASTRODUODENOSCOPY (EGD) WITH PROPOFOL: SHX5813

## 2021-10-05 HISTORY — PX: HEMOSTASIS CLIP PLACEMENT: SHX6857

## 2021-10-05 HISTORY — PX: POLYPECTOMY: SHX5525

## 2021-10-05 LAB — GLUCOSE, CAPILLARY
Glucose-Capillary: 159 mg/dL — ABNORMAL HIGH (ref 70–99)
Glucose-Capillary: 276 mg/dL — ABNORMAL HIGH (ref 70–99)
Glucose-Capillary: 103 mg/dL — ABNORMAL HIGH (ref 70–99)
Glucose-Capillary: 163 mg/dL — ABNORMAL HIGH (ref 70–99)

## 2021-10-05 LAB — CBC
HCT: 31.1 % — ABNORMAL LOW (ref 36.0–46.0)
Hemoglobin: 10.2 g/dL — ABNORMAL LOW (ref 12.0–15.0)
MCH: 31.5 pg (ref 26.0–34.0)
MCHC: 32.8 g/dL (ref 30.0–36.0)
MCV: 96 fL (ref 80.0–100.0)
Platelets: 348 10*3/uL (ref 150–400)
RBC: 3.24 MIL/uL — ABNORMAL LOW (ref 3.87–5.11)
RDW: 13.9 % (ref 11.5–15.5)
WBC: 7.7 10*3/uL (ref 4.0–10.5)
nRBC: 0 % (ref 0.0–0.2)

## 2021-10-05 LAB — URINE CULTURE

## 2021-10-05 SURGERY — ESOPHAGOGASTRODUODENOSCOPY (EGD) WITH PROPOFOL
Anesthesia: Monitor Anesthesia Care

## 2021-10-05 MED ORDER — LIP MEDEX EX OINT
TOPICAL_OINTMENT | CUTANEOUS | Status: DC | PRN
  Filled 2021-10-05: qty 7

## 2021-10-05 MED ORDER — SPIRONOLACTONE 12.5 MG HALF TABLET
12.5000 mg | ORAL_TABLET | Freq: Every day | ORAL | Status: DC
  Administered 2021-10-05: 12.5 mg via ORAL
  Filled 2021-10-05: qty 1

## 2021-10-05 MED ORDER — PROPOFOL 500 MG/50ML IV EMUL
INTRAVENOUS | Status: DC | PRN
  Administered 2021-10-05: 80 ug/kg/min via INTRAVENOUS

## 2021-10-05 MED ORDER — LIDOCAINE HCL (CARDIAC) PF 100 MG/5ML IV SOSY
PREFILLED_SYRINGE | INTRAVENOUS | Status: DC | PRN
  Administered 2021-10-05: 80 mg via INTRAVENOUS

## 2021-10-05 MED ORDER — LOSARTAN POTASSIUM 50 MG PO TABS
50.0000 mg | ORAL_TABLET | Freq: Two times a day (BID) | ORAL | Status: DC
  Administered 2021-10-05 – 2021-10-06 (×3): 50 mg via ORAL
  Filled 2021-10-05 (×3): qty 1

## 2021-10-05 MED ORDER — METOPROLOL SUCCINATE ER 25 MG PO TB24
25.0000 mg | ORAL_TABLET | Freq: Every day | ORAL | Status: DC
  Administered 2021-10-05 – 2021-10-06 (×2): 25 mg via ORAL
  Filled 2021-10-05 (×2): qty 1

## 2021-10-05 MED ORDER — POLYVINYL ALCOHOL 1.4 % OP SOLN
1.0000 [drp] | OPHTHALMIC | Status: DC | PRN
  Administered 2021-10-05: 1 [drp] via OPHTHALMIC
  Filled 2021-10-05: qty 15

## 2021-10-05 MED ORDER — PHENOL 1.4 % MT LIQD
1.0000 | OROMUCOSAL | Status: DC | PRN
  Administered 2021-10-05: 1 via OROMUCOSAL
  Filled 2021-10-05: qty 177

## 2021-10-05 MED ORDER — HYDRALAZINE HCL 20 MG/ML IJ SOLN
10.0000 mg | Freq: Once | INTRAMUSCULAR | Status: AC
  Administered 2021-10-05: 10 mg via INTRAVENOUS

## 2021-10-05 MED ORDER — AMLODIPINE BESYLATE 5 MG PO TABS
5.0000 mg | ORAL_TABLET | Freq: Every day | ORAL | Status: DC
  Administered 2021-10-05 – 2021-10-06 (×2): 5 mg via ORAL
  Filled 2021-10-05 (×2): qty 1

## 2021-10-05 MED ORDER — HYDRALAZINE HCL 20 MG/ML IJ SOLN
INTRAMUSCULAR | Status: AC
Start: 2021-10-05 — End: ?
  Filled 2021-10-05: qty 1

## 2021-10-05 MED ORDER — PROPOFOL 10 MG/ML IV BOLUS
INTRAVENOUS | Status: DC | PRN
  Administered 2021-10-05: 20 mg via INTRAVENOUS
  Administered 2021-10-05: 30 mg via INTRAVENOUS
  Administered 2021-10-05 (×2): 20 mg via INTRAVENOUS
  Administered 2021-10-05 (×2): 10 mg via INTRAVENOUS
  Administered 2021-10-05 (×2): 20 mg via INTRAVENOUS
  Administered 2021-10-05 (×2): 10 mg via INTRAVENOUS
  Administered 2021-10-05: 20 mg via INTRAVENOUS

## 2021-10-05 MED ORDER — LEVOTHYROXINE SODIUM 100 MCG PO TABS: 100.0000 ug | ORAL_TABLET | Freq: Every day | ORAL | Status: DC

## 2021-10-05 SURGICAL SUPPLY — 15 items

## 2021-10-05 NOTE — Anesthesia Postprocedure Evaluation (Signed)
Anesthesia Post Note ? ?Patient: Amber Herman ? ?Procedure(s) Performed: ESOPHAGOGASTRODUODENOSCOPY (EGD) WITH PROPOFOL ?POLYPECTOMY ? ?  ? ?Patient location during evaluation: PACU ?Anesthesia Type: MAC ?Level of consciousness: awake and alert ?Pain management: pain level controlled ?Vital Signs Assessment: post-procedure vital signs reviewed and stable ?Respiratory status: spontaneous breathing, nonlabored ventilation and respiratory function stable ?Cardiovascular status: stable and blood pressure returned to baseline ?Anesthetic complications: no ? ? ?No notable events documented. ? ?Last Vitals:  ?Vitals:  ? 10/05/21 0847 10/05/21 0900  ?BP: (!) 131/39 (!) 145/81  ?Pulse: 81 77  ?Resp: (!) 22 16  ?Temp: 36.5 ?C   ?SpO2: 97% 96%  ?  ?Last Pain:  ?Vitals:  ? 10/05/21 0847  ?TempSrc: Temporal  ?PainSc: 5   ? ? ?  ?  ?  ?  ?  ?  ? ?Audry Pili ? ? ? ? ?

## 2021-10-05 NOTE — Brief Op Note (Signed)
10/03/2021 - 10/05/2021 ? ?8:50 AM ? ?PATIENT:  Amber Herman  76 y.o. female ? ?PRE-OPERATIVE DIAGNOSIS:  Anemia, GI bleed ? ?POST-OPERATIVE DIAGNOSIS:  gastric polypectomy, gastric biopsy  ? ?PROCEDURE:  Procedure(s): ?ESOPHAGOGASTRODUODENOSCOPY (EGD) WITH PROPOFOL (N/A) ?POLYPECTOMY ? ?SURGEON:  Surgeon(s) and Role: ?   * Otis Brace, MD - Primary ? ?Findings ?----------- ?-EGD showed multiple gastric polyps with stigmata of recent bleeding.  Polyps were removed with a hot snare.  3 clips placed at 2 different post polypectomy site. ? ?Recommendation ?---------------------- ?-Start soft diet and advance as tolerated ?-Recommend observation overnight ?-Recommend to hold anticoagulation for additional 72 hours ?-Protonix 40 mg twice a day at least for 4 weeks followed by Protonix 40 mg once a day for another 4 weeks ?-Avoid NSAIDs ?-GI will follow ?-Hopefully discharge tomorrow from GI standpoint if no further bleeding and hemoglobin stable. ? ?Otis Brace MD, FACP ?10/05/2021, 8:51 AM ? ?Contact #  [phone]  ?

## 2021-10-05 NOTE — Op Note (Signed)
[facility] ?Patient Name: Amber Herman ?Procedure Date: 10/05/2021 ?MRN: [id number] ?Attending MD: Otis Brace , MD ?Date of Birth: 05/14/1946 ?CSN: [id number] ?Age: 76 ?Admit Type: Inpatient ?Procedure:                Upper GI endoscopy ?Indications:              Melena ?Providers:                Otis Brace, MD, Jeanella Cara, RN,  ?                          Frazier Richards, Technician ?Referring MD:              ?Medicines:                 ?Complications:            No immediate complications. ?Estimated Blood Loss:     Estimated blood loss was minimal. ?Procedure:                Pre-Anesthesia Assessment: ?                          - Prior to the procedure, a History and Physical  ?                          was performed, and patient medications and  ?                          allergies were reviewed. The patient's tolerance of  ?                          previous anesthesia was also reviewed. The risks  ?                          and benefits of the procedure and the sedation  ?                          options and risks were discussed with the patient.  ?                          All questions were answered, and informed consent  ?                          was obtained. Prior Anticoagulants: The patient has  ?                          taken Eliquis (apixaban), last dose was 3 days  ?                          prior to procedure. ASA Grade Assessment: III - A  ?                          patient with severe systemic disease. After  ?                          reviewing the  risks and benefits, the patient was  ?                          deemed in satisfactory condition to undergo the  ?                          procedure. ?                          After obtaining informed consent, the endoscope was  ?                          passed under direct vision. Throughout the  ?                          procedure, the patient's blood pressure, pulse, and  ?                          oxygen  saturations were monitored continuously. The  ?                          [id number]-[id number] ([id number]) Olympus endoscope was introduced  ?                          through the mouth, and advanced to the second part  ?                          of duodenum. The upper GI endoscopy was  ?                          accomplished without difficulty. The patient  ?                          tolerated the procedure well. ?Scope In: ?Scope Out: ?Findings: ?     The Z-line was regular and was found 40 cm from the incisors. ?     Multiple 6 to 15 mm pedunculated and sessile polyps with no bleeding and  ?     stigmata of recent bleeding were found in the entire examined stomach .  ?     These polyps were removed with a hot snare. Resection and retrieval were  ?     complete. To prevent bleeding after the polypectomy, three hemostatic  ?     clips were successfully placed. There was no bleeding at the end of the  ?     procedure. ?     Scattered mild inflammation characterized by erythema was found in the  ?     gastric body. Biopsies were taken with a cold forceps for histology. ?     The duodenal bulb, first portion of the duodenum and second portion of  ?     the duodenum were normal. ?Impression:               - Z-line regular, 40 cm from the incisors. ?                          - Multiple gastric polyps. Resected and retrieved.  ?  Clips were placed. ?                          - Gastritis. Biopsied. ?                          - Normal duodenal bulb, first portion of the  ?                          duodenum and second portion of the duodenum. ?Moderate Sedation: ?     Moderate (conscious) sedation was personally administered by an  ?     anesthesia professional. The following parameters were monitored: oxygen  ?     saturation, heart rate, blood pressure, and response to care. ?Recommendation:           - Return patient to hospital ward for ongoing care. ?                          - Soft diet. ?                           - Continue present medications. ?                          - Await pathology results. ?                          - Resume Eliquis (apixaban) at prior dose in 3 days. ?Procedure Code(s):        --- Professional --- ?                          [phone], Esophagogastroduodenoscopy, flexible,  ?                          transoral; with removal of tumor(s), polyp(s), or  ?                          other lesion(s) by snare technique ?                          43239, 59, Esophagogastroduodenoscopy, flexible,  ?                          transoral; with biopsy, single or multiple ?Diagnosis Code(s):        --- Professional --- ?                          K31.7, Polyp of stomach and duodenum ?                          K29.70, Gastritis, unspecified, without bleeding ?                          K92.1, Melena (includes Hematochezia) ?CPT copyright 2019 [employer]. All rights reserved. ?The codes documented in this report are preliminary and upon coder review may  ?be revised to meet current compliance requirements. ?Otis Brace, MD ?Otis Brace,  MD ?10/05/2021 8:49:46 AM ?Number of Addenda: 0 ?

## 2021-10-05 NOTE — Transfer of Care (Signed)
Immediate Anesthesia Transfer of Care Note ? ?Patient: Amber Herman ? ?Procedure(s) Performed: ESOPHAGOGASTRODUODENOSCOPY (EGD) WITH PROPOFOL ?POLYPECTOMY ? ?Patient Location: Endoscopy Unit ? ?Anesthesia Type:MAC ? ?Level of Consciousness: awake, alert , oriented and patient cooperative ? ?Airway & Oxygen Therapy: Patient Spontanous Breathing ? ?Post-op Assessment: Report given to RN and Post -op Vital signs reviewed and stable ? ?Post vital signs: Reviewed and stable ? ?Last Vitals:  ?Vitals Value Taken Time  ?BP 121/39 10/05/21 0843  ?Temp    ?Pulse 84 10/05/21 0844  ?Resp 21 10/05/21 0845  ?SpO2 96 % 10/05/21 0844  ?Vitals shown include unvalidated device data. ? ?Last Pain:  ?Vitals:  ? 10/05/21 0736  ?TempSrc: Temporal  ?PainSc: 0-No pain  ?   ? ?  ? ?Complications: No notable events documented. ?

## 2021-10-05 NOTE — Anesthesia Preprocedure Evaluation (Addendum)
Anesthesia Evaluation  ?Patient identified by MRN, date of birth, ID band ?Patient awake ? ? ? ?Reviewed: ?Allergy & Precautions, NPO status , Patient's Chart, lab work & pertinent test results, reviewed documented beta blocker date and time  ? ?History of Anesthesia Complications ?Negative for: history of anesthetic complications ? ?Airway ?Mallampati: II ? ?TM Distance: >3 FB ?Neck ROM: Full ? ? ? Dental ? ?(+) Dental Advisory Given ?  ?Pulmonary ?neg pulmonary ROS,  ?  ?Pulmonary exam normal ? ? ? ? ? ? ? Cardiovascular ?hypertension, Pt. on medications and Pt. on home beta blockers ?+ Past MI  ?Normal cardiovascular exam+ dysrhythmias Atrial Fibrillation  ? ? ?'21 Myoperfusion - Normal study, no signs of ischemia ? ?'20 TTE - EF 65 to 70%. There is moderately increased left ventricular hypertrophy. Diastolic Doppler parameters are consistent with impaired relaxation pattern of LV diastolic filling. Trace mitral valve regurgitation. Tricuspid valve regurgitation is trivial. Mild aortic valve sclerosis without stenosis.  ? ?  ?Neuro/Psych ?TIAnegative psych ROS  ? GI/Hepatic ?Neg liver ROS, GERD  Medicated and Controlled, ?GI bleed ? ?  ?Endo/Other  ?diabetes, Type 2, Oral Hypoglycemic AgentsHypothyroidism  ? Renal/GU ?negative Renal ROS  ? ?  ?Musculoskeletal ?negative musculoskeletal ROS ?(+)  ? Abdominal ?  ?Peds ? Hematology ? ?(+) Blood dyscrasia, anemia ,  ?On eliquis ?   ?Anesthesia Other Findings ? ? Reproductive/Obstetrics ? ?  ? ? ? ? ? ? ? ? ? ? ? ? ? ?  ?  ? ? ? ? ? ? ? ?Anesthesia Physical ?Anesthesia Plan ? ?ASA: 3 ? ?Anesthesia Plan: MAC  ? ?Post-op Pain Management: Minimal or no pain anticipated  ? ?Induction:  ? ?PONV Risk Score and Plan: 2 and Propofol infusion and Treatment may vary due to age or medical condition ? ?Airway Management Planned: Nasal Cannula and Natural Airway ? ?Additional Equipment: None ? ?Intra-op Plan:  ? ?Post-operative Plan:  ? ?Informed  Consent: I have reviewed the patients History and Physical, chart, labs and discussed the procedure including the risks, benefits and alternatives for the proposed anesthesia with the patient or authorized representative who has indicated his/her understanding and acceptance.  ? ? ? ? ? ?Plan Discussed with: CRNA and Anesthesiologist ? ?Anesthesia Plan Comments:   ? ? ? ? ? ? ?Anesthesia Quick Evaluation ? ?

## 2021-10-05 NOTE — Interval H&P Note (Signed)
History and Physical Interval Note: ? ?10/05/2021 ?8:08 AM ? ?Amber Herman  has presented today for surgery, with the diagnosis of Anemia, GI bleed.  The various methods of treatment have been discussed with the patient and family. After consideration of risks, benefits and other options for treatment, the patient has consented to  Procedure(s): ?ESOPHAGOGASTRODUODENOSCOPY (EGD) WITH PROPOFOL (N/A) as a surgical intervention.  The patient's history has been reviewed, patient examined, no change in status, stable for surgery.  I have reviewed the patient's chart and labs.  Questions were answered to the patient's satisfaction.   ? ? ?[name] ? ? ?

## 2021-10-05 NOTE — Progress Notes (Signed)
?      ? ? ? [facility] ?                                                                           ? ? ?Amber Herman, is a 76 y.o. female, DOB - [birth year], SAY:[id number] ?Admit date - 10/03/2021    ?Outpatient Primary MD for the patient is Renee Rival, NP ? ?LOS - 1  days ? ?Chief Complaint  ?Patient presents with  ? Near Syncope  ? dark stools  ?    ? ?Brief summary  ? ?Patient is a 76 year old female with essential hypertension, DM type II, HLP, paroxysmal atrial fibrillation on Eliquis, chronic anxiety/depression, iron deficiency anemia on iron supplement presented to ED with dark stools and excessive flatulence for 1 day.  No nausea however reported poor oral intake.  Had 1 large dark brown loose stool a day before.  In ED rectal exam showed dark stools FOBT negative. ?CT angio GI bleed showed no evidence of active extravasation within the GI tract to localize site.  Pancolonic diverticulosis with questionable area of diverticulitis involving mid to proximal sigmoid, no perforation. ?UA positive for pyuria, started on IV antibiotics empirically for diverticulitis. ?Admitted for further work-up. ?Hemoglobin 10.9 ? ?Assessment & Plan  ? ? ?Principal Problem: ?  Upper GI bleed with chronic normocytic anemia ?-Presented with dark stools, intermittent abdominal pain for last 2 months.  Has history of atrial fibrillation on Eliquis, last dose on 5/5 AM, ?-Colonoscopy contraindicated due to active diverticulitis. ?-Eliquis held ?-Underwent EGD, showed multiple gastric polyps with stigmata of recent bleeding, removed with a hot snare, 3 clips placed ?-Recommendations started soft diet, overnight observation continue to hold anticoagulation for at least 72 hours. ?-Protonix 40 mg twice daily for 4 weeks then daily for another 4 weeks. ?-H&H stable ? ? ?Active Problems: ?Acute diverticulitis ?-Currently no acute abdominal pain, continue IV Rocephin and Flagyl ?-Colonoscopy contraindicated ?-Diet advanced  to soft solids ? ?Pyuria, ?  UTI ?-Continue IV Rocephin ?-Urine culture showed multiple species ? ?Acute kidney injury with mild non-anion gap metabolic acidosis ?-Prerenal in setting of poor oral intake, diarrhea ?-Presented with creatinine of 1.15, baseline 0.7 ?-Patient was placed on IV fluid hydration, creatinine improved, 0.9 ? ?Near syncope ?-Likely secondary to orthostatic hypotension, orthostatics positive in ED ?-BP now significantly elevated, resume losartan, Toprol-XL, Imdur, amlodipine ?-Continue hydralazine IV as needed with parameters ? ?Paroxysmal atrial fibrillation ?-Rate controlled, continue Toprol-XL, amiodarone ?-Eliquis on hold ? ?  Diabetes mellitus, type II ([place]) ?-Continue to hold metformin, ?-Placed on sliding scale insulin while inpatient, moderate ?-Hemoglobin A1c 7.1 ? ? ? ? ?Code Status: Full CODE STATUS ?DVT Prophylaxis:  Place and maintain sequential compression device Start: 10/04/21 1237  SCDs ? ? ?Level of Care: Level of care: Telemetry ?Family Communication: Updated patient ? ? ?Disposition Plan:      Remains inpatient appropriate: Hopefully DC home in a.m. ? ? ?Procedures:  ?EGD ? ?Consultants:   ?Gastroenterology ? ?Antimicrobials:  ? ?Anti-infectives (From admission, onward)  ? ? Start     Dose/Rate Route Frequency Ordered Stop  ? 10/04/21 2000  cefTRIAXone (ROCEPHIN) 2 g in sodium chloride 0.9 % 100 mL IVPB       ?  2 g ?200 mL/hr over 30 Minutes Intravenous Every 24 hours 10/04/21 1229    ? 10/04/21 1000  metroNIDAZOLE (FLAGYL) IVPB 500 mg       ? 500 mg ?100 mL/hr over 60 Minutes Intravenous Every 12 hours 10/03/21 2227    ? 10/03/21 2100  cefTRIAXone (ROCEPHIN) 2 g in sodium chloride 0.9 % 100 mL IVPB       ?See Hyperspace for full Linked Orders Report.  ? 2 g ?200 mL/hr over 30 Minutes Intravenous  Once 10/03/21 2050 10/03/21 2229  ? 10/03/21 2100  metroNIDAZOLE (FLAGYL) IVPB 500 mg       ?See Hyperspace for full Linked Orders Report.  ? 500 mg ?100 mL/hr over 60 Minutes  Intravenous  Once 10/03/21 2050 10/04/21 0059  ? ?  ? ? ? ? ? ?Medications ? amiodarone  200 mg Oral Daily  ? hydrALAZINE  25 mg Oral Q8H  ? insulin aspart  0-15 Units Subcutaneous TID WC  ? insulin aspart  0-5 Units Subcutaneous QHS  ? isosorbide mononitrate  30 mg Oral BID  ? levothyroxine  100 mcg Oral Q0600  ? pantoprazole (PROTONIX) IV  40 mg Intravenous BID  ? ? ? ? ?Subjective:  ? ?Amber Herman was seen and examined today.  Seen after EGD, no acute complaints.  Alert and oriented, no acute nausea vomiting or abdominal pain.   ? ?Objective:  ? ?Vitals:  ? 10/05/21 0809 10/05/21 0847 10/05/21 0900 10/05/21 0910  ?BP: (!) 174/50 (!) 131/39 (!) 145/81 (!) 176/46  ?Pulse:  81 77 75  ?Resp:  (!) '22 16 11  '$ ?Temp:  97.7 ?F (36.5 ?C)    ?TempSrc:  Temporal    ?SpO2:  97% 96% 97%  ?Weight:      ?Height:      ? ? ?Intake/Output Summary (Last 24 hours) at 10/05/2021 1311 ?Last data filed at 10/05/2021 1040 ?Gross per 24 hour  ?Intake 1854.76 ml  ?Output 1100 ml  ?Net 754.76 ml  ? ? ? ?Wt Readings from Last 3 Encounters:  ?10/05/21 63.5 kg  ?12/19/20 63.5 kg  ?09/13/19 65.3 kg  ? ?Physical Exam ?General: Alert and oriented x 3, NAD ?Cardiovascular: S1 S2 clear, RRR ?Respiratory: CTAB,  ?Gastrointestinal: Soft, nontender, nondistended, NBS ?Ext: no pedal edema bilaterally ?Neuro: no new deficits ?Psych: Normal affect and demeanor, alert and oriented x3  ? ? ?Data Reviewed:  I have personally reviewed following labs  ? ? ?CBC ?Lab Results  ?Component Value Date  ? WBC 7.7 10/05/2021  ? RBC 3.24 (L) 10/05/2021  ? HGB 10.2 (L) 10/05/2021  ? HCT 31.1 (L) 10/05/2021  ? MCV 96.0 10/05/2021  ? MCH 31.5 10/05/2021  ? PLT 348 10/05/2021  ? MCHC 32.8 10/05/2021  ? RDW 13.9 10/05/2021  ? LYMPHSABS 1.5 10/04/2021  ? MONOABS 0.7 10/04/2021  ? EOSABS 0.1 10/04/2021  ? BASOSABS 0.0 10/04/2021  ? ? ? ?Last metabolic panel ?Lab Results  ?Component Value Date  ? NA 137 10/04/2021  ? K 3.8 10/04/2021  ? CL 102 10/04/2021  ? CO2 26 10/04/2021   ? BUN 16 10/04/2021  ? CREATININE 0.90 10/04/2021  ? GLUCOSE 104 (H) 10/04/2021  ? GFRNONAA >60 10/04/2021  ? GFRAA >60 08/18/2019  ? CALCIUM 8.7 (L) 10/04/2021  ? PHOS 3.4 10/04/2021  ? PROT 6.7 10/04/2021  ? ALBUMIN 3.5 10/04/2021  ? BILITOT 0.5 10/04/2021  ? ALKPHOS 52 10/04/2021  ? AST 14 (L) 10/04/2021  ? ALT 14 10/04/2021  ?  ANIONGAP 9 10/04/2021  ? ? ?CBG (last 3)  ?Recent Labs  ?  10/04/21 ?2057 10/05/21 ?4562 10/05/21 ?1231  ?GLUCAP 210* 159* 276*  ?  ? ? ?Coagulation Profile: ?No results for input(s): INR, PROTIME in the last 168 hours. ? ? ?Radiology Studies: I have personally reviewed the imaging studies  ?DG Chest 2 View ? ?Result Date: 10/03/2021 ?CLINICAL DATA:  Near syncope.  Atrial fibrillation. EXAM: CHEST - 2 VIEW IMPRESSION: No active cardiopulmonary disease. Electronically Signed   By: Marlaine Hind M.D.   On: 10/03/2021 18:38  ? ?CT ANGIO GI BLEED ? ?Result Date: 10/03/2021 ?CLINICAL DATA:  GI bleed.  Dark stools. EXAM: CTA ABDOMEN AND PELVIS IMPRESSION: VASCULAR 1. No evidence of active extravasation within the GI tract to localize site of GI bleed. 2. Atherosclerosis of the abdominal aorta and its branches. NON-VASCULAR 1. Pancolonic diverticulosis with a questionable area of diverticulitis involving the mid proximal sigmoid. No perforation. 2. Calcified uterine fibroids. Electronically Signed   By: Keith Rake M.D.   On: 10/03/2021 20:38   ? ? ? ? ?Estill Cotta M.D. ?[facility] ?10/05/2021, 1:11 PM ? ?Available via Epic secure chat 7am-7pm ?After 7 pm, please refer to night coverage provider listed on amion. ? ? ?

## 2021-10-06 DIAGNOSIS — K922 Gastrointestinal hemorrhage, unspecified: Secondary | ICD-10-CM | POA: Diagnosis not present

## 2021-10-06 DIAGNOSIS — K5792 Diverticulitis of intestine, part unspecified, without perforation or abscess without bleeding: Secondary | ICD-10-CM | POA: Diagnosis not present

## 2021-10-06 DIAGNOSIS — I480 Paroxysmal atrial fibrillation: Secondary | ICD-10-CM | POA: Diagnosis not present

## 2021-10-06 LAB — CBC
HCT: 29.2 % — ABNORMAL LOW (ref 36.0–46.0)
Hemoglobin: 9.6 g/dL — ABNORMAL LOW (ref 12.0–15.0)
MCH: 31.6 pg (ref 26.0–34.0)
MCHC: 32.9 g/dL (ref 30.0–36.0)
MCV: 96.1 fL (ref 80.0–100.0)
Platelets: 317 10*3/uL (ref 150–400)
RBC: 3.04 MIL/uL — ABNORMAL LOW (ref 3.87–5.11)
RDW: 14.1 % (ref 11.5–15.5)
WBC: 8.5 10*3/uL (ref 4.0–10.5)
nRBC: 0 % (ref 0.0–0.2)

## 2021-10-06 LAB — GLUCOSE, CAPILLARY: Glucose-Capillary: 223 mg/dL — ABNORMAL HIGH (ref 70–99)

## 2021-10-06 MED ORDER — PRAZOSIN HCL 1 MG PO CAPS: 4.0000 mg | ORAL_CAPSULE | Freq: Every day | ORAL | Status: DC

## 2021-10-06 MED ORDER — APIXABAN 5 MG PO TABS: 5.0000 mg | ORAL_TABLET | Freq: Two times a day (BID) | ORAL | 1 refills | Status: AC

## 2021-10-06 MED ORDER — PANTOPRAZOLE SODIUM 40 MG PO TBEC: 40.0000 mg | DELAYED_RELEASE_TABLET | Freq: Two times a day (BID) | ORAL | 2 refills | Status: DC

## 2021-10-06 MED ORDER — ONDANSETRON HCL 4 MG PO TABS: 4.0000 mg | ORAL_TABLET | Freq: Every day | ORAL | 1 refills | Status: DC | PRN

## 2021-10-06 MED ORDER — PRAZOSIN HCL 1 MG PO CAPS
2.0000 mg | ORAL_CAPSULE | Freq: Every day | ORAL | Status: DC
  Filled 2021-10-06: qty 2

## 2021-10-06 MED ORDER — AMOXICILLIN-POT CLAVULANATE 875-125 MG PO TABS: 1.0000 | ORAL_TABLET | Freq: Two times a day (BID) | ORAL | Status: DC

## 2021-10-06 MED ORDER — AMOXICILLIN-POT CLAVULANATE 875-125 MG PO TABS: 1.0000 | ORAL_TABLET | Freq: Two times a day (BID) | ORAL | 0 refills | Status: AC

## 2021-10-06 NOTE — Progress Notes (Signed)
[facility] Progress Note ? ?Amber Herman 76 y.o. 10/08/1945 ? ?CC:  Anemia, dark stools ? ? ?Subjective: ?Patient examined sitting comfortably in bed.  She was able to tolerate breakfast well.  She had 1 dark green bowel movement this morning.  Denies abdominal pain, nausea, vomiting, hematochezia.  Hemoglobin stable at 9.6. ? ?Status post EGD yesterday.  Multiple gastric polyps removed clips placed, gastritis biopsied.  Normal duodenum.  Awaiting biopsies. ? ?ROS : Review of Systems  ?Gastrointestinal:  Negative for abdominal pain, blood in stool, constipation, diarrhea, heartburn, melena, nausea and vomiting.  ?Genitourinary:  Negative for dysuria and urgency.   ? ? ?Objective: ?Vital signs in last 24 hours: ?Vitals:  ? 10/05/21 2110 10/06/21 0607  ?BP: (!) 184/79 (!) 164/70  ?Pulse: 76 63  ?Resp: 17 14  ?Temp: 98.3 ?F (36.8 ?C) 98 ?F (36.7 ?C)  ?SpO2: 97%   ? ? ?Physical Exam: ? ?General:  Alert, cooperative, no distress, appears stated age, pale skin  ?Head:  Normocephalic, without obvious abnormality, atraumatic  ?Eyes:  Anicteric sclera, EOM's intact, conjunctival pallor  ?Lungs:   Clear to auscultation bilaterally, respirations unlabored  ?Heart:  Regular rate and rhythm, S1, S2 normal  ?Abdomen:   Soft, non-tender, bowel sounds active all four quadrants,  no masses,   ?Extremities: Extremities normal, atraumatic, no  edema  ?Pulses: 2+ and symmetric  ? ? ?Lab Results: ?Recent Labs  ?  10/03/21 ?1815 10/04/21 ?0272  ?NA 135 137  ?K 4.3 3.8  ?CL 103 102  ?CO2 20* 26  ?GLUCOSE 117* 104*  ?BUN 23 16  ?CREATININE 1.15* 0.90  ?CALCIUM 8.9 8.7*  ?MG  --  1.7  ?PHOS  --  3.4  ? ?Recent Labs  ?  10/03/21 ?1815 10/04/21 ?5366  ?AST 19 14*  ?ALT 17 14  ?ALKPHOS 51 52  ?BILITOT 0.4 0.5  ?PROT 7.0 6.7  ?ALBUMIN 3.7 3.5  ? ?Recent Labs  ?  10/03/21 ?1815 10/04/21 ?4403 10/05/21 ?4742 10/06/21 ?0454  ?WBC 9.1 7.6 7.7 8.5  ?NEUTROABS 6.9 5.3  --   --   ?HGB 10.9* 10.1* 10.2* 9.6*  ?HCT 34.0* 30.9* 31.1* 29.2*   ?MCV 97.1 96.6 96.0 96.1  ?PLT 332 328 348 317  ? ?No results for input(s): LABPROT, INR in the last 72 hours. ? ? ? ?Assessment ?Anemia ?Melena ?Diverticulitis ? ?Status post EGD yesterday.  Multiple gastric polyps removed clips placed, gastritis biopsied.  Normal duodenum.  Awaiting biopsies. ? ?Hemoglobin stable today at 9.6.  Baseline hemoglobin around 10.5 9 months ago. ? ?Abdominal pain improving today.  Questionable area of diverticulitis seen in mid proximal sigmoid colon on CT Angio GI bleed.  She is on ceftriaxone and Flagyl for diverticulitis.  ? ?Last colonoscopy about 10 years ago.  Patient requesting repeat colon with Dr. Alessandra Bevels.  We will need to wait 8 weeks for repeat colonoscopy due to diverticulitis.  We will schedule outpatient.  ? ? ?Plan: ?Continue supportive care ?Will inform patient of biopsy results from EGD outpatient. ?Continue ceftriaxone and Flagyl for diverticulitis while in the hospital.  Will need to be on 7 days outpatient oral antibiotic therapy for diverticulitis. ?Can restart Eliquis Wednesday ?Continue Protonix 40 mg IV twice daily while in the hospital.  Will need to continue Protonix 40 mg twice daily outpatient.   ?We will schedule outpatient appointment for repeat colonoscopy with Dr. Alessandra Bevels. ?Will monitor hemoglobin if less than 7 will transfuse. ?[facility] will sign off. ? ?Arvella Nigh  Sumaiyah Markert PA-C ?10/06/2021, 9:15 AM ? ?Contact #  [phone]  ?

## 2021-10-06 NOTE — Discharge Summary (Signed)
?Physician Discharge Summary ?  ?Patient: Amber Herman MRN: [id number] DOB: 11-Nov-1945  ?Admit date:     10/03/2021  ?Discharge date: 10/06/21  ?Discharge Physician: Estill Cotta, MD   ? ?PCP: Renee Rival, NP  ? ?Recommendations at discharge:  ? ?Augmentin 875/125 mg 1 tab p.o. twice daily for 7 days ?Continue Protonix 40 mg twice daily ?Plan for outpatient colonoscopy.  Follow-up with Dr. Alessandra Bevels ?Resume eliquis on Wednesday, 10/08/2021 ? ?Discharge Diagnoses: ? ?  Upper GI bleed ?  Normocytic anemia ?  AKI (acute kidney injury) ([place]) ?  Near syncope ?  Acute diverticulitis ?  Acute upper GI bleed ?  Diabetes mellitus, type II ([place]) ?  Hypertension ?  Anemia ?  AF (paroxysmal atrial fibrillation) ([place]) ?   ? ? ? ?Hospital Course: ?Patient is a 76 year old female with essential hypertension, DM type II, HLP, paroxysmal atrial fibrillation on Eliquis, chronic anxiety/depression, iron deficiency anemia on iron supplement presented to ED with dark stools and excessive flatulence for 1 day.  No nausea however reported poor oral intake.  Had 1 large dark brown loose stool a day before.  In ED rectal exam showed dark stools FOBT negative. ?CT angio GI bleed showed no evidence of active extravasation within the GI tract to localize site.  Pancolonic diverticulosis with questionable area of diverticulitis involving mid to proximal sigmoid, no perforation. ?UA positive for pyuria, started on IV antibiotics empirically for diverticulitis. ?Admitted for further work-up. ?Hemoglobin 10.9 ?  ? ?Assessment and Plan: ? ?Upper GI bleed with chronic normocytic anemia ?-Presented with dark stools, intermittent abdominal pain for last 2 months.  Has history of atrial fibrillation on Eliquis, last dose on 5/5 AM, ?-Colonoscopy contraindicated due to active diverticulitis.  Eliquis was held. ?-Underwent EGD, showed multiple gastric polyps with stigmata of recent bleeding, removed with a hot snare, 3 clips placed ?-Currently  doing well, tolerating soft diet.  Per GI, cleared to be discharged home, continue Protonix twice daily, will need outpatient colonoscopy and follow-up.   ?-H&H stable, 9.6 ?-Resume anticoagulation on 10/08/2021 ? ?Acute diverticulitis ?-Currently no acute abdominal pain, patient was placed on IV Rocephin and Flagyl ?-will need outpatient colonoscopy ?-Diet advanced to soft solids ?Continue Augmentin 1 tab p.o. twice daily for 7 days ?  ?Pyuria, ?-Patient was placed on IV Rocephin urine culture showed multiple species ?  ?Acute kidney injury with mild non-anion gap metabolic acidosis ?-Prerenal in setting of poor oral intake, diarrhea ?-Presented with creatinine of 1.15, baseline 0.7 ?-Patient was placed on IV fluid hydration, creatinine improved, 0.9 ?  ?Near syncope ?-Likely secondary to orthostatic hypotension, orthostatics positive in ED ?Initially borderline BP in ED however subsequently hypotensive.  Resumed patient's antihypertensive regimen. ? ?  ?Paroxysmal atrial fibrillation ?-Rate controlled, continue Toprol-XL, amiodarone ?-Eliquis on hold, resume on Wednesday, 10/08/2021 ?  ?  Diabetes mellitus, type II ([place]) ?-Hemoglobin A1c 7.1 ?Resume metformin ? ? ? ?Pain control - [employer] Controlled Substance Reporting System database was reviewed. and patient was instructed, not to drive, operate heavy machinery, perform activities at heights, swimming or participation in water activities or provide baby-sitting services while on Pain, Sleep and Anxiety Medications; until their outpatient Physician has advised to do so again. Also recommended to not to take more than prescribed Pain, Sleep and Anxiety Medications.  ?Consultants: Gastroenterology ?Procedures performed: EGD ?Disposition: Home ?Diet recommendation:  ?Discharge Diet Orders (From admission, onward)  ? ?  Start     Ordered  ? 10/06/21 0000  Diet Carb Modified       ? 10/06/21 0950  ? ?  ?  ? ?  ? ?Carb modified diet ?DISCHARGE  MEDICATION: ?Allergies as of 10/06/2021   ? ?   Reactions  ? Angiotensin Receptor Blockers   ? Taking losartan at admission  ? Calcium Channel Blockers   ? Latex Hives, Itching  ? Nicardipine Hcl   ? Simvastatin   ? Clonidine Other (See Comments)  ? Tablet caused GI issues and fatigue  ? Sulfa Antibiotics Itching, Rash  ? Sulfacetamide Sodium Itching, Rash  ? Sulfasalazine Itching, Rash  ? ?  ? ?  ?Medication List  ?  ? ?STOP taking these medications   ? ?esomeprazole 40 MG capsule ?Commonly known as: [place] ?  ? ?  ? ?TAKE these medications   ? ?acetaminophen 325 MG tablet ?Commonly known as: Tylenol ?Take 2 tablets (650 mg total) by mouth every 8 (eight) hours as needed. ?  ?amiodarone 200 MG tablet ?Commonly known as: PACERONE ?TAKE 1 TABLET BY MOUTH ONCE DAILY. ?  ?amLODipine 5 MG tablet ?Commonly known as: NORVASC ?Take 5 mg by mouth daily. ?  ?amoxicillin-clavulanate 875-125 MG tablet ?Commonly known as: AUGMENTIN ?Take 1 tablet by mouth 2 (two) times daily for 7 days. ?  ?apixaban 5 MG Tabs tablet ?Commonly known as: ELIQUIS ?Take 1 tablet (5 mg total) by mouth 2 (two) times daily. ?Start taking on: Oct 08, 2021 ?What changed: These instructions start on Oct 08, 2021. If you are unsure what to do until then, ask your doctor or other care provider. ?  ?B-12 [phone] MCG Subl ?Take 1 tablet by mouth daily. ?  ?eplerenone 25 MG tablet ?Commonly known as: INSPRA ?Take 25 mg by mouth daily. ?  ?escitalopram 5 MG tablet ?Commonly known as: LEXAPRO ?Take 10 mg by mouth in the morning. ?  ?famotidine 20 MG tablet ?Commonly known as: PEPCID ?Take 20 mg by mouth daily. ?  ?FeroSul 325 (65 FE) MG tablet ?Generic drug: ferrous sulfate ?Take 325 mg by mouth daily. ?  ?levothyroxine 100 MCG tablet ?Commonly known as: SYNTHROID ?Take 100 mcg by mouth daily. ?  ?losartan 50 MG tablet ?Commonly known as: COZAAR ?Take 1 tablet (50 mg total) by mouth 2 (two) times daily. ?  ?magnesium oxide 400 (241.3 Mg) MG tablet ?Commonly  known as: MAG-OX ?TAKE 1 TABLET BY MOUTH ONCE DAILY. ?  ?metFORMIN 850 MG tablet ?Commonly known as: GLUCOPHAGE ?Take 850 mg by mouth 2 (two) times daily with a meal. ?  ?metoprolol succinate 50 MG 24 hr tablet ?Commonly known as: TOPROL-XL ?Take 25 mg by mouth daily. ?  ?nitroGLYCERIN 0.4 MG SL tablet ?Commonly known as: NITROSTAT ?Place 1 tablet (0.4 mg total) under the tongue every 5 (five) minutes as needed for chest pain (CP or SOB). ?  ?ondansetron 4 MG disintegrating tablet ?Commonly known as: Zofran ODT ?'4mg'$  ODT q4 hours prn nausea/vomit ?  ?ondansetron 4 MG tablet ?Commonly known as: Zofran ?Take 1 tablet (4 mg total) by mouth daily as needed for nausea or vomiting. ?  ?pantoprazole 40 MG tablet ?Commonly known as: Protonix ?Take 1 tablet (40 mg total) by mouth 2 (two) times daily before a meal. ?  ?pravastatin 40 MG tablet ?Commonly known as: PRAVACHOL ?Take 40 mg by mouth at bedtime. ?  ?prazosin 2 MG capsule ?Commonly known as: MINIPRESS ?Take 2 mg by mouth 2 (two) times daily. 1 in the am and 2 at bedtime ?  ?spironolactone  25 MG tablet ?Commonly known as: ALDACTONE ?Take 0.5 tablets (12.5 mg total) by mouth at bedtime. ?  ?Tyler Aas FlexTouch 200 UNIT/ML FlexTouch Pen ?Generic drug: insulin degludec ?Inject 16 Units into the skin at bedtime. ?  ?vitamin B-12 1000 MCG tablet ?Commonly known as: CYANOCOBALAMIN ?Take 1,000 mcg by mouth daily. ?  ? ?  ? ? Follow-up Information   ? ? Renee Rival, NP. Schedule an appointment as soon as possible for a visit in 2 week(s).   ?Specialty: Nurse Practitioner ?Contact information: ?[address] ?[place] [address] ?[phone] ? ? ?  ?  ? ? Satira Sark, MD .   ?Specialty: Cardiology ?Contact information: ?[address] ?[address] ?[place] [address] ?[phone] ? ? ?  ?  ? ? Otis Brace, MD. Schedule an appointment as soon as possible for a visit in 2 week(s).   ?Specialty: Gastroenterology ?Why: for hospital follow-up ?Contact  information: ?[address] ?[place] [address] ?[phone] ? ? ?  ?  ? ?  ?  ? ?  ? ?Discharge Exam: ?Filed Weights  ? 10/03/21 1759 10/05/21 0736  ?Weight: 63.5 kg 63.5 kg  ? ?S: Doing well, ambulating in the room, cleared

## 2021-10-06 NOTE — Progress Notes (Signed)
PT Cancellation Note ? ?Patient Details ?Name: Amber Herman ?MRN: [id number] ?DOB: 09-28-1945 ? ? ?Cancelled Treatment:    Reason Eval/Treat Not Completed: PT screened, no needs identified, will sign off ? ? ?Latrisa Hellums ?10/06/2021, 12:04 PM ?

## 2021-10-07 ENCOUNTER — Encounter (HOSPITAL_COMMUNITY): Payer: Self-pay | Admitting: Gastroenterology

## 2021-10-07 LAB — SURGICAL PATHOLOGY

## 2021-10-14 ENCOUNTER — Encounter (HOSPITAL_COMMUNITY): Payer: Self-pay

## 2021-10-14 ENCOUNTER — Other Ambulatory Visit: Payer: Self-pay

## 2021-10-14 ENCOUNTER — Emergency Department (HOSPITAL_COMMUNITY)
Admission: EM | Admit: 2021-10-14 | Discharge: 2021-10-14 | Disposition: A | Discharge status: Home / Self Care | Payer: Medicare Other | Attending: Emergency Medicine | Admitting: Emergency Medicine

## 2021-10-14 ENCOUNTER — Emergency Department (HOSPITAL_COMMUNITY): Payer: Medicare Other

## 2021-10-14 DIAGNOSIS — Z7901 Long term (current) use of anticoagulants: Secondary | ICD-10-CM | POA: Diagnosis not present

## 2021-10-14 DIAGNOSIS — Z91040 Latex allergy status: Secondary | ICD-10-CM | POA: Insufficient documentation

## 2021-10-14 DIAGNOSIS — Z79899 Other long term (current) drug therapy: Secondary | ICD-10-CM | POA: Diagnosis not present

## 2021-10-14 DIAGNOSIS — R42 Dizziness and giddiness: Secondary | ICD-10-CM | POA: Diagnosis not present

## 2021-10-14 LAB — COMPREHENSIVE METABOLIC PANEL
ALT: 18 U/L (ref 0–44)
AST: 21 U/L (ref 15–41)
Albumin: 3.9 g/dL (ref 3.5–5.0)
Alkaline Phosphatase: 60 U/L (ref 38–126)
Anion gap: 12 (ref 5–15)
BUN: 21 mg/dL (ref 8–23)
CO2: 24 mmol/L (ref 22–32)
Calcium: 9.5 mg/dL (ref 8.9–10.3)
Chloride: 96 mmol/L — ABNORMAL LOW (ref 98–111)
Creatinine, Ser: 1.15 mg/dL — ABNORMAL HIGH (ref 0.44–1.00)
GFR, Estimated: 49 mL/min — ABNORMAL LOW (ref >60)
Glucose, Bld: 95 mg/dL (ref 70–99)
Potassium: 4.7 mmol/L (ref 3.5–5.1)
Sodium: 132 mmol/L — ABNORMAL LOW (ref 135–145)
Total Bilirubin: 0.4 mg/dL (ref 0.3–1.2)
Total Protein: 7.4 g/dL (ref 6.5–8.1)

## 2021-10-14 LAB — URINALYSIS, ROUTINE W REFLEX MICROSCOPIC
Bacteria, UA: NONE SEEN
Bilirubin Urine: NEGATIVE
Glucose, UA: NEGATIVE mg/dL
Hgb urine dipstick: NEGATIVE
Ketones, ur: NEGATIVE mg/dL
Nitrite: NEGATIVE
Protein, ur: NEGATIVE mg/dL
Specific Gravity, Urine: 1.01 (ref 1.005–1.030)
pH: 6 (ref 5.0–8.0)

## 2021-10-14 LAB — TYPE AND SCREEN
ABO/RH(D): A POS
Antibody Screen: NEGATIVE

## 2021-10-14 LAB — CBC
HCT: 32.6 % — ABNORMAL LOW (ref 36.0–46.0)
Hemoglobin: 10.8 g/dL — ABNORMAL LOW (ref 12.0–15.0)
MCH: 32 pg (ref 26.0–34.0)
MCHC: 33.1 g/dL (ref 30.0–36.0)
MCV: 96.4 fL (ref 80.0–100.0)
Platelets: 387 10*3/uL (ref 150–400)
RBC: 3.38 MIL/uL — ABNORMAL LOW (ref 3.87–5.11)
RDW: 13.5 % (ref 11.5–15.5)
WBC: 11.2 10*3/uL — ABNORMAL HIGH (ref 4.0–10.5)
nRBC: 0 % (ref 0.0–0.2)

## 2021-10-14 LAB — TROPONIN I (HIGH SENSITIVITY)
Troponin I (High Sensitivity): 5 ng/L (ref <18)
Troponin I (High Sensitivity): 3 ng/L (ref <18)

## 2021-10-14 LAB — CBG MONITORING, ED: Glucose-Capillary: 84 mg/dL (ref 70–99)

## 2021-10-14 NOTE — ED Provider Notes (Signed)
?[facility] DEPT ?Provider Note ? ? ?CSN: [id number] ?Arrival date & time: 10/14/21  1524 ? ?  ? ?History ? ?Chief Complaint  ?Patient presents with  ? Near Syncope  ? Fatigue  ? Melena  ? ? ?Amber Herman is a 76 y.o. female. ? ?76 year old female with prior medical history as detailed below presents for evaluation.  Patient reports lightheadedness.  She reports intermittent episodes where she feels like she might pass out.  She denies any actual syncope.  These episodes of been ongoing for the last 2 to 3 weeks. ? ?She denies any associated chest pain or palpitations.  She denies fevers.  She denies shortness of breath. ? ?She denies any current complaint. ? ?She apparently has already seen her PCP regarding these issues and was reassured. ? ?She has a upcoming appointment with her cardiologist. ? ?Patient with recent admission for upper GI bleeding.  Patient was found to have multiple gastric polyps with stigmata of recent bleeding.  Patient denies any recent nausea or vomiting or hematemesis.  She denies any recent dark or tarry stools or diarrhea.  She denies any abdominal pain. ? ? ? ?The history is provided by the patient and medical records.  ?Near Syncope ?This is a recurrent problem. The current episode started more than 1 week ago. The problem occurs every several days. The problem has not changed since onset.Pertinent negatives include no chest pain and no abdominal pain. Nothing aggravates the symptoms. Nothing relieves the symptoms.  ? ?  ? ?Home Medications ?Prior to Admission medications   ?Medication Sig Start Date End Date Taking? Authorizing Provider  ?acetaminophen (TYLENOL) 325 MG tablet Take 2 tablets (650 mg total) by mouth every 8 (eight) hours as needed. 11/19/17   Mesner, Corene Cornea, MD  ?amiodarone (PACERONE) 200 MG tablet TAKE 1 TABLET BY MOUTH ONCE DAILY. ?Patient taking differently: Take 200 mg by mouth daily. 03/21/20   Satira Sark, MD  ?amLODipine  (NORVASC) 5 MG tablet Take 5 mg by mouth daily.    [provider]  ?apixaban (ELIQUIS) 5 MG TABS tablet Take 1 tablet (5 mg total) by mouth 2 (two) times daily. 10/08/21   Rai, Vernelle Emerald, MD  ?Cobalamin Combinations (B-12) [phone] MCG SUBL Take 1 tablet by mouth daily.    [provider]  ?eplerenone (INSPRA) 25 MG tablet Take 25 mg by mouth daily.    [provider]  ?escitalopram (LEXAPRO) 5 MG tablet Take 10 mg by mouth in the morning. 11/29/20   [provider]  ?famotidine (PEPCID) 20 MG tablet Take 20 mg by mouth daily.    [provider]  ?FEROSUL 325 (65 Fe) MG tablet Take 325 mg by mouth daily.  02/03/19   [provider]  ?levothyroxine (SYNTHROID) 100 MCG tablet Take 100 mcg by mouth daily. 08/29/21   [provider]  ?losartan (COZAAR) 50 MG tablet Take 1 tablet (50 mg total) by mouth 2 (two) times daily. 04/19/19 10/04/21  Satira Sark, MD  ?magnesium oxide (MAG-OX) 400 (241.3 Mg) MG tablet TAKE 1 TABLET BY MOUTH ONCE DAILY. 06/14/19   Satira Sark, MD  ?metFORMIN (GLUCOPHAGE) 850 MG tablet Take 850 mg by mouth 2 (two) times daily with a meal.  01/16/19   [provider]  ?metoprolol succinate (TOPROL-XL) 50 MG 24 hr tablet Take 25 mg by mouth daily. 03/19/21   [provider]  ?nitroGLYCERIN (NITROSTAT) 0.4 MG SL tablet Place 1 tablet (0.4 mg total)  under the tongue every 5 (five) minutes as needed for chest pain (CP or SOB). 02/23/19   Deatra James, MD  ?ondansetron (ZOFRAN ODT) 4 MG disintegrating tablet '4mg'$  ODT q4 hours prn nausea/vomit 12/19/20   Elnora Morrison, MD  ?ondansetron (ZOFRAN) 4 MG tablet Take 1 tablet (4 mg total) by mouth daily as needed for nausea or vomiting. 10/06/21 10/06/22  Rai, Vernelle Emerald, MD  ?pantoprazole (PROTONIX) 40 MG tablet Take 1 tablet (40 mg total) by mouth 2 (two) times daily before a meal. 10/06/21 01/04/22  Rai, Ripudeep K, MD  ?pravastatin (PRAVACHOL) 40 MG tablet Take 40 mg by mouth  at bedtime.     [provider]  ?prazosin (MINIPRESS) 2 MG capsule Take 2 mg by mouth 2 (two) times daily. 1 in the am and 2 at bedtime 11/29/20   [provider]  ?spironolactone (ALDACTONE) 25 MG tablet Take 0.5 tablets (12.5 mg total) by mouth at bedtime. 09/18/19   Skeet Latch, MD  ?TRESIBA FLEXTOUCH 200 UNIT/ML SOPN Inject 16 Units into the skin at bedtime.  02/10/17   [provider]  ?vitamin B-12 (CYANOCOBALAMIN) 1000 MCG tablet Take 1,000 mcg by mouth daily.  01/16/19   [provider]  ?   ? ?Allergies    ?Angiotensin receptor blockers, Calcium channel blockers, Latex, Nicardipine hcl, Simvastatin, Clonidine, Sulfa antibiotics, Sulfacetamide sodium, and Sulfasalazine   ? ?Review of Systems   ?Review of Systems  ?Cardiovascular:  Positive for near-syncope. Negative for chest pain.  ?Gastrointestinal:  Negative for abdominal pain.  ?All other systems reviewed and are negative. ? ?Physical Exam ?Updated Vital Signs ?BP (!) 182/61   Pulse (!) 57   Temp 98.4 ?F (36.9 ?C) (Oral)   Resp 14   Ht '5\' 4"'$  (1.626 m)   Wt 63.5 kg   SpO2 97%   BMI 24.03 kg/m?  ?Physical Exam ?Vitals and nursing note reviewed.  ?Constitutional:   ?   General: She is not in acute distress. ?   Appearance: Normal appearance. She is well-developed.  ?HENT:  ?   Head: Normocephalic and atraumatic.  ?Eyes:  ?   Conjunctiva/sclera: Conjunctivae normal.  ?   Pupils: Pupils are equal, round, and reactive to light.  ?Cardiovascular:  ?   Rate and Rhythm: Normal rate and regular rhythm.  ?   Heart sounds: Normal heart sounds.  ?Pulmonary:  ?   Effort: Pulmonary effort is normal. No respiratory distress.  ?   Breath sounds: Normal breath sounds.  ?Abdominal:  ?   General: There is no distension.  ?   Palpations: Abdomen is soft.  ?   Tenderness: There is no abdominal tenderness.  ?Musculoskeletal:     ?   General: No deformity. Normal range of motion.  ?   Cervical back: Normal range of motion and neck  supple.  ?Skin: ?   General: Skin is warm and dry.  ?Neurological:  ?   General: No focal deficit present.  ?   Mental Status: She is alert and oriented to person, place, and time.  ? ? ?ED Results / Procedures / Treatments   ?Labs ?(all labs ordered are listed, but only abnormal results are displayed) ?Labs Reviewed  ?CBC - Abnormal; Notable for the following components:  ?    Result Value  ? WBC 11.2 (*)   ? RBC 3.38 (*)   ? Hemoglobin 10.8 (*)   ? HCT 32.6 (*)   ? All other components within normal limits  ?  URINALYSIS, ROUTINE W REFLEX MICROSCOPIC - Abnormal; Notable for the following components:  ? Leukocytes,Ua MODERATE (*)   ? All other components within normal limits  ?COMPREHENSIVE METABOLIC PANEL - Abnormal; Notable for the following components:  ? Sodium 132 (*)   ? Chloride 96 (*)   ? Creatinine, Ser 1.15 (*)   ? GFR, Estimated 49 (*)   ? All other components within normal limits  ?CBG MONITORING, ED  ?CBG MONITORING, ED  ?TYPE AND SCREEN  ?ABO/RH  ?TROPONIN I (HIGH SENSITIVITY)  ?TROPONIN I (HIGH SENSITIVITY)  ? ? ?EKG ?EKG Interpretation ? ?Date/Time:  Tuesday Oct 14 2021 15:47:20 EDT ?Ventricular Rate:  57 ?PR Interval:  186 ?QRS Duration: 95 ?QT Interval:  438 ?QTC Calculation: 427 ?R Axis:   15 ?Text Interpretation: Sinus rhythm Borderline repolarization abnormality Confirmed by Dene Gentry [id number]) on 10/14/2021 4:34:26 PM ? ?Radiology ?DG Chest 2 View ? ?Result Date: 10/14/2021 ?CLINICAL DATA:  Near syncope. EXAM: CHEST - 2 VIEW COMPARISON:  Chest x-ray 10/03/2021 FINDINGS: The heart size and mediastinal contours are within normal limits. There is linear atelectasis or scarring in the right upper lobe inferiorly. The lungs are otherwise clear. The visualized skeletal structures are unremarkable. IMPRESSION: No active cardiopulmonary disease. Electronically Signed   By: Ronney Asters M.D.   On: 10/14/2021 16:43   ? ?Procedures ?Procedures  ? ? ?Medications Ordered in ED ?Medications - No data to  display ? ?ED Course/ Medical Decision Making/ A&P ?  ?                        ?Medical Decision Making ?Amount and/or Complexity of Data Reviewed ?Labs: ordered. ? ? ? ?Medical Screen Complete ? ?This patient presen

## 2021-10-14 NOTE — ED Triage Notes (Addendum)
Patient c/o near syncope episodes and fatigue x 1 week. Paatient also reports that she has been having tarry stools since 10/06/21. Patient is currently taking Eliquis ?

## 2021-10-14 NOTE — Discharge Instructions (Signed)
Return for any problem.  ?

## 2021-10-14 NOTE — ED Provider Triage Note (Signed)
Emergency Medicine Provider Triage Evaluation Note ? ?Amber Herman , a 76 y.o. female  was evaluated in triage.  Pt complains of near syncope and melena.  Patient reports that she has been having near syncopal episodes and melena for "a right while now."  Patient states that she had an EGD performed on 5/7 performed by Dr. Alessandra Bevels while admitted to the emergency department.  Patient had multiple gastric polyps with stigmata of recent bleeding.  Was cleared to be discharged home and placed on Protonix twice daily.  Anticoagulation was resumed on 10/08/2021. ? ?Patient states that after being discharged she had 2 days without melena or near syncopal episodes.  After that patient began having melena again and started having multiple episodes of near syncope.  This became worse last night.  Patient also endorses fatigue and shortness of breath. ? ?Review of Systems  ?Positive: Near syncope, shortness of breath, fatigue, melena ?Negative: Fever, chills, abdominal pain, nausea, vomiting, diarrhea, palpitations, syncope ? ?Physical Exam  ?BP (!) 141/55 (BP Location: Right Arm)   Pulse (!) 58   Temp 98.4 ?F (36.9 ?C) (Oral)   Resp 14   Ht '5\' 4"'$  (1.626 m)   Wt 63.5 kg   SpO2 99%   BMI 24.03 kg/m?  ?Gen:   Awake, no distress   ?Resp:  Normal effort  ?MSK:   Moves extremities without difficulty  ?Other:  Abdomen soft, nondistended, nontender no guarding or rebound tenderness.  +2 radial pulse bilaterally. ? ?Medical Decision Making  ?Medically screening exam initiated at 4:18 PM.  Appropriate orders placed.  Amber Herman was informed that the remainder of the evaluation will be completed by another provider, this initial triage assessment does not replace that evaluation, and the importance of remaining in the ED until their evaluation is complete. ? ?Due to reports of near syncopal episodes with melena concern for possible symptomatic anemia.  Will check type and screen, basic lab work, and also look for  cardiac cause of patient's near syncopal episodes. ?  ?Loni Beckwith, PA-C ?10/14/21 1621 ? ?

## 2022-02-12 NOTE — Progress Notes (Deleted)
GI Office Note    Referring Provider: Renee Rival, NP Primary Care Physician:  Renee Rival, NP  Primary Gastroenterologist:  Chief Complaint   No chief complaint on file.   History of Present Illness   Amber Herman is a 76 y.o. female presenting today     EGD 09/2021: - Z-line regular, 40 cm from the incisors. - Multiple gastric polyps. Resected and retrieved. Clips were placed. Hyperplastic gastric polyps. - Gastritis. Biopsied. Mild hyperemia, No H.pylori. - Normal duodenal bulb, first portion of the duodenum and second portion of the duodenum.     Medications   Current Outpatient Medications  Medication Sig Dispense Refill   acetaminophen (TYLENOL) 325 MG tablet Take 2 tablets (650 mg total) by mouth every 8 (eight) hours as needed. 60 tablet 0   amiodarone (PACERONE) 200 MG tablet TAKE 1 TABLET BY MOUTH ONCE DAILY. (Patient taking differently: Take 200 mg by mouth daily.) 30 tablet 6   amLODipine (NORVASC) 5 MG tablet Take 5 mg by mouth daily.     apixaban (ELIQUIS) 5 MG TABS tablet Take 1 tablet (5 mg total) by mouth 2 (two) times daily. 60 tablet 1   Cobalamin Combinations (B-12) [phone] MCG SUBL Take 1 tablet by mouth daily.     eplerenone (INSPRA) 25 MG tablet Take 25 mg by mouth daily.     escitalopram (LEXAPRO) 5 MG tablet Take 10 mg by mouth in the morning.     famotidine (PEPCID) 20 MG tablet Take 20 mg by mouth daily.     FEROSUL 325 (65 Fe) MG tablet Take 325 mg by mouth daily.      levothyroxine (SYNTHROID) 100 MCG tablet Take 100 mcg by mouth daily.     losartan (COZAAR) 50 MG tablet Take 1 tablet (50 mg total) by mouth 2 (two) times daily. 180 tablet 3   magnesium oxide (MAG-OX) 400 (241.3 Mg) MG tablet TAKE 1 TABLET BY MOUTH ONCE DAILY. 28 tablet 11   metFORMIN (GLUCOPHAGE) 850 MG tablet Take 850 mg by mouth 2 (two) times daily with a meal.      metoprolol succinate (TOPROL-XL) 50 MG 24 hr tablet Take 25 mg by mouth daily.      nitroGLYCERIN (NITROSTAT) 0.4 MG SL tablet Place 1 tablet (0.4 mg total) under the tongue every 5 (five) minutes as needed for chest pain (CP or SOB). 9 tablet 2   ondansetron (ZOFRAN ODT) 4 MG disintegrating tablet '4mg'$  ODT q4 hours prn nausea/vomit 8 tablet 0   ondansetron (ZOFRAN) 4 MG tablet Take 1 tablet (4 mg total) by mouth daily as needed for nausea or vomiting. 30 tablet 1   pantoprazole (PROTONIX) 40 MG tablet Take 1 tablet (40 mg total) by mouth 2 (two) times daily before a meal. 60 tablet 2   pravastatin (PRAVACHOL) 40 MG tablet Take 40 mg by mouth at bedtime.      prazosin (MINIPRESS) 2 MG capsule Take 2 mg by mouth 2 (two) times daily. 1 in the am and 2 at bedtime     spironolactone (ALDACTONE) 25 MG tablet Take 0.5 tablets (12.5 mg total) by mouth at bedtime. 45 tablet 2   TRESIBA FLEXTOUCH 200 UNIT/ML SOPN Inject 16 Units into the skin at bedtime.      vitamin B-12 (CYANOCOBALAMIN) 1000 MCG tablet Take 1,000 mcg by mouth daily.      No current facility-administered medications for this visit.    Allergies   Allergies as of  02/13/2022 - Review Complete 10/14/2021  Allergen Reaction Noted   Angiotensin receptor blockers  11/25/2011   Calcium channel blockers  11/25/2011   Latex Hives and Itching 02/21/2019   Nicardipine hcl  09/18/2014   Simvastatin  11/25/2011   Clonidine Other (See Comments) 08/18/2019   Sulfa antibiotics Itching and Rash 11/25/2011   Sulfacetamide sodium Itching and Rash 09/18/2014   Sulfasalazine Itching and Rash 11/25/2011     Past Medical History   Past Medical History:  Diagnosis Date   Allergic rhinitis    Anemia    Atrial fibrillation ([place])    Diagnosed 2013   DM (diabetes mellitus), type 2 (HCC)    GERD (gastroesophageal reflux disease)    Hyperlipidemia    Hypertension    Nephrolithiasis    Rotator cuff tear    Thyroid disease    Transient ischemic attack (TIA)    Vitamin D deficiency     Past Surgical History   Past Surgical  History:  Procedure Laterality Date   BIOPSY  10/05/2021   Procedure: BIOPSY;  Surgeon: Otis Brace, MD;  Location: [facility];  Service: Gastroenterology;;   CARDIOVERSION N/A 02/22/2019   Procedure: CARDIOVERSION;  Surgeon: Donato Heinz, MD;  Location: [facility] ENDOSCOPY;  Service: Endoscopy;  Laterality: N/A;   CHOLECYSTECTOMY     COLONOSCOPY     normal per patient, more than 10 years ago   ESOPHAGOGASTRODUODENOSCOPY (EGD) WITH PROPOFOL N/A 10/05/2021   Procedure: ESOPHAGOGASTRODUODENOSCOPY (EGD) WITH PROPOFOL;  Surgeon: Otis Brace, MD;  Location: [facility] ENDOSCOPY;  Service: Gastroenterology;  Laterality: N/A;   HEMOSTASIS CLIP PLACEMENT  10/05/2021   Procedure: HEMOSTASIS CLIP PLACEMENT;  Surgeon: Otis Brace, MD;  Location: [facility] ENDOSCOPY;  Service: Gastroenterology;;   LEFT HEART CATH AND CORONARY ANGIOGRAPHY N/A 02/21/2019   Procedure: LEFT HEART CATH AND CORONARY ANGIOGRAPHY;  Surgeon: [name], MD;  Location: [place] [facility];  Service: Cardiovascular;  Laterality: N/A;   POLYPECTOMY  10/05/2021   Procedure: POLYPECTOMY;  Surgeon: Otis Brace, MD;  Location: [facility] ENDOSCOPY;  Service: Gastroenterology;;   TEE WITHOUT CARDIOVERSION N/A 02/22/2019   Procedure: TRANSESOPHAGEAL ECHOCARDIOGRAM (TEE);  Surgeon: Donato Heinz, MD;  Location: [facility] ENDOSCOPY;  Service: Endoscopy;  Laterality: N/A;    Past Family History   Family History  Problem Relation Age of Onset   Hypertension Father    Diabetes Father    Heart disease Mother    Alzheimer's disease Mother    Diabetes Mother    Heart disease Brother    Colon cancer Neg Hx     Past Social History   Social History   Socioeconomic History   Marital status: Widowed    Spouse name: Not on file   Number of children: Not on file   Years of education: Not on file   Highest education level: Not on file  Occupational History   Not on file  Tobacco Use   Smoking status: Never   Smokeless tobacco:  Never  Vaping Use   Vaping Use: Never used  Substance and Sexual Activity   Alcohol use: No   Drug use: No   Sexual activity: Not Currently  Other Topics Concern   Not on file  Social History Narrative   Not on file   Social Determinants of Health   Financial Resource Strain: Low Risk  (02/20/2019)   Overall Financial Resource Strain (CARDIA)    Difficulty of Paying Living Expenses: Not hard at all  Food Insecurity: Unknown (02/20/2019)   Hunger Vital  Sign    Worried About Charity fundraiser in the Last Year: Patient refused    [place] in the Last Year: Patient refused  Transportation Needs: Unknown (02/20/2019)   [place] - Transportation    Lack of Transportation (Medical): Patient refused    Lack of Transportation (Non-Medical): Patient refused  Physical Activity: Unknown (02/20/2019)   Exercise Vital Sign    Days of Exercise per Week: Patient refused    Minutes of Exercise per Session: Patient refused  Stress: Stress Concern Present (02/20/2019)   [place]    Feeling of Stress : Rather much  Social Connections: Moderately Isolated (02/20/2019)   Social Connection and Isolation Panel [NHANES]    Frequency of Communication with Friends and Family: More than three times a week    Frequency of Social Gatherings with Friends and Family: Twice a week    Attends Religious Services: 1 to 4 times per year    Active Member of [employer] or Organizations: No    Attends Archivist Meetings: Never    Marital Status: Widowed  Intimate Partner Violence: Unknown (02/20/2019)   Humiliation, Afraid, Rape, and Kick questionnaire    Fear of Current or Ex-Partner: Patient refused    Emotionally Abused: Patient refused    Physically Abused: Patient refused    Sexually Abused: Patient refused    Review of Systems   General: Negative for anorexia, weight loss, fever, chills, fatigue, weakness. ENT: Negative for  hoarseness, difficulty swallowing , nasal congestion. CV: Negative for chest pain, angina, palpitations, dyspnea on exertion, peripheral edema.  Respiratory: Negative for dyspnea at rest, dyspnea on exertion, cough, sputum, wheezing.  GI: See history of present illness. GU:  Negative for dysuria, hematuria, urinary incontinence, urinary frequency, nocturnal urination.  Endo: Negative for unusual weight change.     Physical Exam   There were no vitals taken for this visit.   General: Well-nourished, well-developed in no acute distress.  Eyes: No icterus. Mouth: Oropharyngeal mucosa moist and pink , no lesions erythema or exudate. Lungs: Clear to auscultation bilaterally.  Heart: Regular rate and rhythm, no murmurs rubs or gallops.  Abdomen: Bowel sounds are normal, nontender, nondistended, no hepatosplenomegaly or masses,  no abdominal bruits or hernia , no rebound or guarding.  Rectal: ***  Extremities: No lower extremity edema. No clubbing or deformities. Neuro: Alert and oriented x 4   Skin: Warm and dry, no jaundice.   Psych: Alert and cooperative, normal mood and affect.  Labs   *** Imaging Studies   No results found.  Assessment       PLAN   ***   Laureen Ochs. Bobby Rumpf, [place], [facility]

## 2022-02-13 ENCOUNTER — Ambulatory Visit (INDEPENDENT_AMBULATORY_CARE_PROVIDER_SITE_OTHER): Payer: Medicare Other | Admitting: Gastroenterology

## 2022-02-13 ENCOUNTER — Encounter: Payer: Self-pay | Admitting: Gastroenterology

## 2022-02-13 ENCOUNTER — Ambulatory Visit: Payer: Medicare Other | Admitting: Gastroenterology

## 2022-02-13 VITALS — BP 179/68 | HR 55 | Temp 97.5°F | Ht 64.0 in | Wt 141.2 lb

## 2022-02-13 DIAGNOSIS — R933 Abnormal findings on diagnostic imaging of other parts of digestive tract: Secondary | ICD-10-CM | POA: Diagnosis not present

## 2022-02-13 DIAGNOSIS — K5900 Constipation, unspecified: Secondary | ICD-10-CM

## 2022-02-13 DIAGNOSIS — D649 Anemia, unspecified: Secondary | ICD-10-CM | POA: Diagnosis not present

## 2022-02-13 MED ORDER — POLYETHYLENE GLYCOL 3350 17 GM/SCOOP PO POWD: ORAL | 3 refills | Status: DC

## 2022-02-13 NOTE — Progress Notes (Signed)
GI Office Note    Referring Provider: Renee Rival, NP Primary Care Physician:  Renee Rival, NP  Primary Gastroenterologist: Elon Alas. Abbey Chatters, DO   Chief Complaint   Chief Complaint  Patient presents with   Colonoscopy     History of Present Illness   Amber Herman is a 76 y.o. female presenting today to schedule colonoscopy. She has history of Afib on Elquis, HTN, NSTEMI (2020), DM, hypothyroidism, TIA, anxiety.   Patient was admitted back in May 2023 with anemia and dark stools.  Hemoglobin was stable at 10.9, stool was occult blood negative.  CT angio GI bleed protocol showed questionable area of sigmoid diverticulitis.  She reported intermittent black stools for 2 months.  EGD showed multiple gastric polyps with stigmata of recent bleeding.  Polyps were removed with hot snare.  3 clips placed at 2 different polypectomy sites.  Pathology revealed hyperplastic gastric polyps, gastric biopsy with mild hyperemia but no H. pylori.  Patient was encouraged to have outpatient colonoscopy 6 to 8 weeks after discharge, was not done inpatient due to questionable area of diverticulitis.  Patient has a history of chronic anemia, hemoglobin typically in the 10 range over the past 5 years.  Hemoglobin 9.6 at time of discharge on 10/06/2021.  Updated hemoglobin on 10/14/2021 of 10.8.  MCV normal.  No recent iron studies available.  Patient reports has had more constipation lately since in the hospital. BM anywhere from once to 3 times daily.  On days she has only 1 stool, her stool is typically hard.  On the days she has multiple stools, the stools are loose.  She has more issues with constipation lately.  She denies any melena or rectal bleeding.  She states her stools are dark but she takes iron.  Denies previous episodes of diverticulitis.  Occasionally has heartburn but not regularly.  No dysphagia.  No unintentional weight loss.   Patient has not taken her blood pressure this  morning.  She grabbed her medications out the door but forgot to bring water.  Medications   Current Outpatient Medications  Medication Sig Dispense Refill   acetaminophen (TYLENOL) 325 MG tablet Take 2 tablets (650 mg total) by mouth every 8 (eight) hours as needed. 60 tablet 0   amiodarone (PACERONE) 200 MG tablet TAKE 1 TABLET BY MOUTH ONCE DAILY. (Patient taking differently: Take 200 mg by mouth daily.) 30 tablet 6   amLODipine (NORVASC) 5 MG tablet Take 5 mg by mouth daily.     apixaban (ELIQUIS) 5 MG TABS tablet Take 1 tablet (5 mg total) by mouth 2 (two) times daily. 60 tablet 1   eplerenone (INSPRA) 25 MG tablet Take 25 mg by mouth daily.     FEROSUL 325 (65 Fe) MG tablet Take 325 mg by mouth daily.      levothyroxine (SYNTHROID) 100 MCG tablet Take 100 mcg by mouth daily.     losartan (COZAAR) 50 MG tablet Take 1 tablet (50 mg total) by mouth 2 (two) times daily. 180 tablet 3   magnesium oxide (MAG-OX) 400 (241.3 Mg) MG tablet TAKE 1 TABLET BY MOUTH ONCE DAILY. 28 tablet 11   metFORMIN (GLUCOPHAGE) 850 MG tablet Take 850 mg by mouth 2 (two) times daily with a meal.      metoprolol succinate (TOPROL-XL) 50 MG 24 hr tablet Take 25 mg by mouth daily.     pantoprazole (PROTONIX) 40 MG tablet Take 1 tablet (40 mg total) by mouth  2 (two) times daily before a meal. 60 tablet 2   pravastatin (PRAVACHOL) 40 MG tablet Take 40 mg by mouth at bedtime.      prazosin (MINIPRESS) 2 MG capsule Take 2 mg by mouth 2 (two) times daily. 1 in the am and 2 at bedtime     TRESIBA FLEXTOUCH 200 UNIT/ML SOPN Inject 16 Units into the skin at bedtime.      vitamin B-12 (CYANOCOBALAMIN) 1000 MCG tablet Take 1,000 mcg by mouth daily.      No current facility-administered medications for this visit.    Allergies   Allergies as of 02/13/2022 - Review Complete 02/13/2022  Allergen Reaction Noted   Angiotensin receptor blockers  11/25/2011   Calcium channel blockers  11/25/2011   Elemental sulfur Itching  07/24/2021   Latex Hives and Itching 02/21/2019   Minoxidil Nausea Only 04/17/2021   Nicardipine hcl  09/18/2014   Simvastatin  11/25/2011   Clonidine Other (See Comments) 08/18/2019   Sulfa antibiotics Itching and Rash 11/25/2011   Sulfacetamide sodium Itching and Rash 09/18/2014   Sulfasalazine Itching and Rash 11/25/2011    Past Medical History   Past Medical History:  Diagnosis Date   Allergic rhinitis    Anemia    Atrial fibrillation ([place])    Diagnosed 2013   DM (diabetes mellitus), type 2 (HCC)    GERD (gastroesophageal reflux disease)    Hyperlipidemia    Hypertension    Nephrolithiasis    Rotator cuff tear    Thyroid disease    Transient ischemic attack (TIA)    Vitamin D deficiency     Past Surgical History   Past Surgical History:  Procedure Laterality Date   BIOPSY  10/05/2021   Procedure: BIOPSY;  Surgeon: Otis Brace, MD;  Location: [facility];  Service: Gastroenterology;;   CARDIOVERSION N/A 02/22/2019   Procedure: CARDIOVERSION;  Surgeon: Donato Heinz, MD;  Location: [facility] ENDOSCOPY;  Service: Endoscopy;  Laterality: N/A;   CHOLECYSTECTOMY     COLONOSCOPY     normal per patient, more than 10 years ago   ESOPHAGOGASTRODUODENOSCOPY (EGD) WITH PROPOFOL N/A 10/05/2021   Procedure: ESOPHAGOGASTRODUODENOSCOPY (EGD) WITH PROPOFOL;  Surgeon: Otis Brace, MD;  Location: [facility] ENDOSCOPY;  Service: Gastroenterology;  Laterality: N/A;   HEMOSTASIS CLIP PLACEMENT  10/05/2021   Procedure: HEMOSTASIS CLIP PLACEMENT;  Surgeon: Otis Brace, MD;  Location: [facility] ENDOSCOPY;  Service: Gastroenterology;;   LEFT HEART CATH AND CORONARY ANGIOGRAPHY N/A 02/21/2019   Procedure: LEFT HEART CATH AND CORONARY ANGIOGRAPHY;  Surgeon: [name], MD;  Location: [place] CV LAB;  Service: Cardiovascular;  Laterality: N/A;   POLYPECTOMY  10/05/2021   Procedure: POLYPECTOMY;  Surgeon: Otis Brace, MD;  Location: [facility] ENDOSCOPY;  Service: Gastroenterology;;   TEE  WITHOUT CARDIOVERSION N/A 02/22/2019   Procedure: TRANSESOPHAGEAL ECHOCARDIOGRAM (TEE);  Surgeon: Donato Heinz, MD;  Location: [facility] ENDOSCOPY;  Service: Endoscopy;  Laterality: N/A;    Past Family History   Family History  Problem Relation Age of Onset   Heart disease Mother    Alzheimer's disease Mother    Diabetes Mother    Skin cancer Mother    Hypertension Father    Diabetes Father    Heart disease Brother    Breast cancer Daughter    Colon cancer Neg Hx     Past Social History   Social History   Socioeconomic History   Marital status: Widowed    Spouse name: Not on file   Number of children: Not on  file   Years of education: Not on file   Highest education level: Not on file  Occupational History   Not on file  Tobacco Use   Smoking status: Never   Smokeless tobacco: Never  Vaping Use   Vaping Use: Never used  Substance and Sexual Activity   Alcohol use: No   Drug use: No   Sexual activity: Not Currently  Other Topics Concern   Not on file  Social History Narrative   Not on file   Social Determinants of Health   Financial Resource Strain: Low Risk  (02/20/2019)   Overall Financial Resource Strain (CARDIA)    Difficulty of Paying Living Expenses: Not hard at all  Food Insecurity: Unknown (02/20/2019)   Hunger Vital Sign    Worried About Running Out of Food in the Last Year: Patient refused    [place] in the Last Year: Patient refused  Transportation Needs: Unknown (02/20/2019)   [place] - Transportation    Lack of Transportation (Medical): Patient refused    Lack of Transportation (Non-Medical): Patient refused  Physical Activity: Unknown (02/20/2019)   Exercise Vital Sign    Days of Exercise per Week: Patient refused    Minutes of Exercise per Session: Patient refused  Stress: Stress Concern Present (02/20/2019)   [employer] [place]    Feeling of Stress : Rather much  Social  Connections: Moderately Isolated (02/20/2019)   Social Connection and Isolation Panel [NHANES]    Frequency of Communication with Friends and Family: More than three times a week    Frequency of Social Gatherings with Friends and Family: Twice a week    Attends Religious Services: 1 to 4 times per year    Active Member of [employer] or Organizations: No    Attends Archivist Meetings: Never    Marital Status: Widowed  Intimate Partner Violence: Unknown (02/20/2019)   Humiliation, Afraid, Rape, and Kick questionnaire    Fear of Current or Ex-Partner: Patient refused    Emotionally Abused: Patient refused    Physically Abused: Patient refused    Sexually Abused: Patient refused    Review of Systems   General: Negative for anorexia, weight loss, fever, chills, fatigue, weakness. Eyes: Negative for vision changes.  ENT: Negative for hoarseness, difficulty swallowing , nasal congestion. CV: Negative for chest pain, angina, palpitations, dyspnea on exertion, peripheral edema.  Respiratory: Negative for dyspnea at rest, dyspnea on exertion, cough, sputum, wheezing.  GI: See history of present illness. GU:  Negative for dysuria, hematuria, urinary incontinence, urinary frequency, nocturnal urination.  MS: Shoulder pain, no low back pain.  Derm: Negative for rash or itching.  Neuro: Negative for weakness, abnormal sensation, seizure, frequent headaches, memory loss,  confusion.  Psych: Negative for anxiety, depression, suicidal ideation, hallucinations.  Endo: Negative for unusual weight change.  Heme: Negative for bruising or bleeding. Allergy: Negative for rash or hives.  Physical Exam   BP (!) 170/61 (BP Location: Right Arm, Patient Position: Sitting, Cuff Size: Normal)   Pulse (!) 55   Temp (!) 97.5 F (36.4 C) (Temporal)   Ht '5\' 4"'$  (1.626 m)   Wt 141 lb 3.2 oz (64 kg)   SpO2 99%   BMI 24.24 kg/m    General: Well-nourished, well-developed in no acute distress.  Head:  Normocephalic, atraumatic.   Eyes: Conjunctiva pink, no icterus. Mouth: Oropharyngeal mucosa moist and pink , no lesions erythema or exudate. Neck: Supple without thyromegaly, masses,  or lymphadenopathy.  Lungs: Clear to auscultation bilaterally.  Heart: Regular rate and rhythm, no murmurs rubs or gallops.  Abdomen: Bowel sounds are normal, nontender, nondistended, no hepatosplenomegaly or masses,  no abdominal bruits or hernia, no rebound or guarding.   Rectal: Not performed Extremities: No lower extremity edema. No clubbing or deformities.  Neuro: Alert and oriented x 4 , grossly normal neurologically.  Skin: Warm and dry, no rash or jaundice.   Psych: Alert and cooperative, normal mood and affect.  Labs   Lab Results  Component Value Date   CREATININE 1.15 (H) 10/14/2021   BUN 21 10/14/2021   NA 132 (L) 10/14/2021   K 4.7 10/14/2021   CL 96 (L) 10/14/2021   CO2 24 10/14/2021   Lab Results  Component Value Date   ALT 18 10/14/2021   AST 21 10/14/2021   ALKPHOS 60 10/14/2021   BILITOT 0.4 10/14/2021   Lab Results  Component Value Date   WBC 11.2 (H) 10/14/2021   HGB 10.8 (L) 10/14/2021   HCT 32.6 (L) 10/14/2021   MCV 96.4 10/14/2021   PLT 387 10/14/2021   Lab Results  Component Value Date   VITAMINB12 981 (H) 07/13/2019   Lab Results  Component Value Date   FOLATE 13.4 07/13/2019   Lab Results  Component Value Date   IRON 40 (L) 08/02/2019   TIBC 308 08/02/2019   FERRITIN 33 08/02/2019     Imaging Studies   No results found.  Assessment   Normocytic anemia: Relatively stable, in the 10 range for at least 5 years.  Presented in May 2023 with complaints of dark stools and weakness.  Stool was Hemoccult negative at that time.  Stools were dark.  CTA GI bleed scan revealed no active bleeding but a question of diverticulitis involving the mid proximal sigmoid colon.  EGD findings as outlined above.  Remote colonoscopy.  Colonoscopy has been recommended for  further evaluate abnormal colon seen on CT as well as for anemia and change in bowel habits.  Hemoglobin has been stable for years, previous iron studies with slightly low serum iron and low normal ferritin but that was a couple of years ago.  She has been on oral iron since then.  Alternating constipation/diarrhea: With tendency towards constipation more recently.  Hypertension: Blood pressure up more than baseline today, she has not taken her blood pressure medication.  She reports that her systolic pressures typically in the 130-155 range.  She follows at [facility].  According to their note from June, patient has had multiple intolerances to blood pressure medications and has been reluctant to change her current regimen.  They recommend preop appointments prior to surgeries with consideration to pre-sedate to keep blood pressure down if needed.   PLAN   CBC, iron/TIBC/ferritin Start MiraLAX 1 capful daily until soft stools, then continue daily as needed. Colonoscopy in the near future.  ASA 3.  I have discussed the risks, alternatives, benefits with regards to but not limited to the risk of reaction to medication, bleeding, infection, perforation and the patient is agreeable to proceed. Written consent to be obtained. Hold Eliquis 48 hours prior to colonoscopy.   Laureen Ochs. Bobby Rumpf, [place], [facility]

## 2022-02-13 NOTE — Patient Instructions (Signed)
Please have labs done. Start miralax one capful daily until soft stools, then continue daily as needed to maintain soft stools. Colonoscopy to be scheduled.  You will need to hold Eliquis for 48 hours prior to your colonoscopy. You will need to stop iron 7 days before your colonoscopy.

## 2022-02-16 ENCOUNTER — Telehealth: Payer: Self-pay | Admitting: *Deleted

## 2022-02-16 NOTE — Telephone Encounter (Signed)
Called pt, no answer and not able to leave VM to schedule TCS with Dr. Abbey Chatters, ASA 3

## 2022-02-20 ENCOUNTER — Telehealth: Payer: Self-pay | Admitting: *Deleted

## 2022-02-20 ENCOUNTER — Encounter: Payer: Self-pay | Admitting: *Deleted

## 2022-02-20 MED ORDER — NA SULFATE-K SULFATE-MG SULF 17.5-3.13-1.6 GM/177ML PO SOLN: ORAL | 0 refills | Status: DC

## 2022-02-20 NOTE — Telephone Encounter (Signed)
error 

## 2022-02-20 NOTE — Telephone Encounter (Signed)
Pt requested that suprep by sent in. Advise pt that if it's not covered by insurance to call and let us know. Verbalized understanding.

## 2022-02-20 NOTE — Telephone Encounter (Signed)
Pt has been scheduled for 03/20/22 at 9:30 am. Pt informed will have a pre-op appointment a few days before procedure. Instructions will be mailed and prep sent to the pharmacy

## 2022-02-23 ENCOUNTER — Encounter: Payer: Self-pay | Admitting: Gastroenterology

## 2022-02-23 ENCOUNTER — Telehealth (INDEPENDENT_AMBULATORY_CARE_PROVIDER_SITE_OTHER): Payer: Medicare Other | Admitting: Gastroenterology

## 2022-02-23 ENCOUNTER — Telehealth: Payer: Self-pay

## 2022-02-23 VITALS — Ht 63.0 in | Wt 141.0 lb

## 2022-02-23 DIAGNOSIS — A09 Infectious gastroenteritis and colitis, unspecified: Secondary | ICD-10-CM

## 2022-02-23 DIAGNOSIS — K625 Hemorrhage of anus and rectum: Secondary | ICD-10-CM

## 2022-02-23 DIAGNOSIS — K5792 Diverticulitis of intestine, part unspecified, without perforation or abscess without bleeding: Secondary | ICD-10-CM | POA: Diagnosis not present

## 2022-02-23 DIAGNOSIS — K921 Melena: Secondary | ICD-10-CM

## 2022-02-23 MED ORDER — METRONIDAZOLE 500 MG PO TABS: 500.0000 mg | ORAL_TABLET | Freq: Three times a day (TID) | ORAL | 0 refills | Status: AC

## 2022-02-23 MED ORDER — CIPROFLOXACIN HCL 500 MG PO TABS: 500.0000 mg | ORAL_TABLET | Freq: Two times a day (BID) | ORAL | 0 refills | Status: AC

## 2022-02-23 NOTE — Telephone Encounter (Signed)
[place], you are scheduled for a virtual visit with your provider today.  Just as we do with appointments in the office, we must obtain your consent to participate.  Your consent will be active for this visit and any virtual visit you may have with one of our providers in the next 365 days.  If you have a MyChart account, I can also send a copy of this consent to you electronically.  All virtual visits are billed to your insurance company just like a traditional visit in the office.  As this is a virtual visit, video technology does not allow for your provider to perform a traditional examination.  This may limit your provider's ability to fully assess your condition.  If your provider identifies any concerns that need to be evaluated in person or the need to arrange testing such as labs, EKG, etc, we will make arrangements to do so.  Although advances in technology are sophisticated, we cannot ensure that it will always work on either your end or our end.  If the connection with a video visit is poor, we may have to switch to a telephone visit.  With either a video or telephone visit, we are not always able to ensure that we have a secure connection.   I need to obtain your verbal consent now.   Are you willing to proceed with your visit today? Yes.

## 2022-02-23 NOTE — Telephone Encounter (Signed)
Pt called stating that her diverticulitis is acting up. Pt is requesting that something be called in to [facility]. Pt states that she is having lower abdominal pain, diarrhea, no fever, no nausea/vomiting. Pt was last seen on 02/13/22 and is scheduled for a colonoscopy on 03/20/22. Please advise.

## 2022-02-23 NOTE — Progress Notes (Unsigned)
Primary Care Physician:  Renee Rival, NP Primary GI:  Elon Alas. Abbey Chatters, DO  Patient Location: Home  Provider Location: [place] office  Reason for Phone Visit:  Chief Complaint  Patient presents with   Diarrhea   Persons present on the phone encounter, with roles: Patient, myself (provider),Tammy Clifton, CMA (updated meds and allergies)  Total time (minutes) spent on medical discussion: 15 minutes  Due to COVID-19, visit was conducted using telephonic method (no video was available).  Visit was requested by patient.  Virtual Visit via Telephone only  I connected with Ms. Mayotte on 02/23/22 at  2:30 PM EDT by telephone and verified that I am speaking with the correct person using two identifiers.   I discussed the limitations, risks, security and privacy concerns of performing an evaluation and management service by telephone and the availability of in person appointments. I also discussed with the patient that there may be a patient responsible charge related to this service. The patient expressed understanding and agreed to proceed.   HPI:   Patient is a pleasant 76 y/o female who presents for telephone visit for same day appointment for possible diverticulitis.  She was last seen on September 15, currently scheduled for colonoscopy in the upcoming weeks. She has a history of A-fib on Eliquis, hypertension, NSTEMI (2020), diabetes, hypothyroidism, TIA, anxiety.  Patient was admitted back in May 2023 with anemia and dark stools.  Hemoglobin was stable at 10.9, stool was occult blood negative.  CT angio GI bleed protocol showed questionable area of sigmoid diverticulitis. She reported intermittent black stools for 2 months.  EGD showed multiple gastric polyps with stigmata of recent bleeding.  Polyps were removed with hot snare.  3 clips placed at 2 different polypectomy sites. Pathology revealed hyperplastic gastric polyps, gastric biopsy with mild hyperemia but no H.  pylori.  Patient was encouraged to have outpatient colonoscopy 6 to 8 weeks after discharge, was not done inpatient due to questionable area of diverticulitis.  Patient has a history of chronic anemia, hemoglobin typically in the 10 range over the past 5 years. Hemoglobin 9.6 at time of discharge on 10/06/2021.  Updated hemoglobin on 10/14/2021 of 10.8.  MCV normal. No recent iron studies available.   Last office visit she was having more issues with constipation.  Started on MiraLAX 1 capful daily until soft stools, then continue as needed.  Scheduled her for colonoscopy to evaluate abnormal CT findings of the sigmoid colon, suspected diverticulitis.  Today: patient notes pain in the lower abdomen, yesterday was worse than today. No melena, brbpr. Yesterday was having watery stools. This morning has had three stools but none since this morning. The stools 1/2 black. Has seen this in the past. Today stool brown. Takes iron daily. No Pepto. Yesterday thought was going to have to go to the ER due to pain. No N/V, fever. No urinary symptoms.   Current Outpatient Medications  Medication Sig Dispense Refill   acetaminophen (TYLENOL) 325 MG tablet Take 2 tablets (650 mg total) by mouth every 8 (eight) hours as needed. 60 tablet 0   amiodarone (PACERONE) 200 MG tablet TAKE 1 TABLET BY MOUTH ONCE DAILY. (Patient taking differently: Take 200 mg by mouth daily.) 30 tablet 6   amLODipine (NORVASC) 5 MG tablet Take 5 mg by mouth daily.     apixaban (ELIQUIS) 5 MG TABS tablet Take 1 tablet (5 mg total) by mouth 2 (two) times daily. 60 tablet 1   eplerenone (  INSPRA) 25 MG tablet Take 25 mg by mouth daily.     FEROSUL 325 (65 Fe) MG tablet Take 325 mg by mouth daily.      levothyroxine (SYNTHROID) 100 MCG tablet Take 100 mcg by mouth daily.     losartan (COZAAR) 50 MG tablet Take 1 tablet (50 mg total) by mouth 2 (two) times daily. 180 tablet 3   magnesium oxide (MAG-OX) 400 (241.3 Mg) MG tablet TAKE 1 TABLET BY  MOUTH ONCE DAILY. 28 tablet 11   metFORMIN (GLUCOPHAGE) 850 MG tablet Take 850 mg by mouth 2 (two) times daily with a meal.      metoprolol succinate (TOPROL-XL) 50 MG 24 hr tablet Take 25 mg by mouth daily.     Na Sulfate-K Sulfate-Mg Sulf 17.5-3.13-1.6 GM/177ML SOLN As directed 354 mL 0   pantoprazole (PROTONIX) 40 MG tablet Take 1 tablet (40 mg total) by mouth 2 (two) times daily before a meal. 60 tablet 2   polyethylene glycol powder (GLYCOLAX/MIRALAX) 17 GM/SCOOP powder Take one capful daily until soft stool, then continue once daily as needed to maintain soft stool. 527 g 3   pravastatin (PRAVACHOL) 40 MG tablet Take 40 mg by mouth at bedtime.      prazosin (MINIPRESS) 2 MG capsule Take 2 mg by mouth 2 (two) times daily. 1 in the am and 2 at bedtime     TRESIBA FLEXTOUCH 200 UNIT/ML SOPN Inject 16 Units into the skin at bedtime.      vitamin B-12 (CYANOCOBALAMIN) 1000 MCG tablet Take 1,000 mcg by mouth daily.      No current facility-administered medications for this visit.    Past Medical History:  Diagnosis Date   Allergic rhinitis    Anemia    Atrial fibrillation ([place])    Diagnosed 2013   DM (diabetes mellitus), type 2 (HCC)    GERD (gastroesophageal reflux disease)    Hyperlipidemia    Hypertension    Nephrolithiasis    Rotator cuff tear    Thyroid disease    Transient ischemic attack (TIA)    Vitamin D deficiency     Past Surgical History:  Procedure Laterality Date   BIOPSY  10/05/2021   Procedure: BIOPSY;  Surgeon: Otis Brace, MD;  Location: [facility];  Service: Gastroenterology;;   CARDIOVERSION N/A 02/22/2019   Procedure: CARDIOVERSION;  Surgeon: Donato Heinz, MD;  Location: [facility]COPY;  Service: Endoscopy;  Laterality: N/A;   CHOLECYSTECTOMY     COLONOSCOPY     normal per patient, more than 10 years ago   ESOPHAGOGASTRODUODENOSCOPY (EGD) WITH PROPOFOL N/A 10/05/2021   Procedure: ESOPHAGOGASTRODUODENOSCOPY (EGD) WITH PROPOFOL;  Surgeon:  Otis Brace, MD;  Location: [facility] ENDOSCOPY;  Service: Gastroenterology;  Laterality: N/A;   HEMOSTASIS CLIP PLACEMENT  10/05/2021   Procedure: HEMOSTASIS CLIP PLACEMENT;  Surgeon: Otis Brace, MD;  Location: [facility] ENDOSCOPY;  Service: Gastroenterology;;   LEFT HEART CATH AND CORONARY ANGIOGRAPHY N/A 02/21/2019   Procedure: LEFT HEART CATH AND CORONARY ANGIOGRAPHY;  Surgeon: [name], MD;  Location: [place] [facility];  Service: Cardiovascular;  Laterality: N/A;   POLYPECTOMY  10/05/2021   Procedure: POLYPECTOMY;  Surgeon: Otis Brace, MD;  Location: [facility] ENDOSCOPY;  Service: Gastroenterology;;   TEE WITHOUT CARDIOVERSION N/A 02/22/2019   Procedure: TRANSESOPHAGEAL ECHOCARDIOGRAM (TEE);  Surgeon: Donato Heinz, MD;  Location: [facility] ENDOSCOPY;  Service: Endoscopy;  Laterality: N/A;    Family History  Problem Relation Age of Onset   Heart disease Mother    Alzheimer's  disease Mother    Diabetes Mother    Skin cancer Mother    Hypertension Father    Diabetes Father    Heart disease Brother    Breast cancer Daughter    Colon cancer Neg Hx     Social History   Socioeconomic History   Marital status: Widowed    Spouse name: Not on file   Number of children: Not on file   Years of education: Not on file   Highest education level: Not on file  Occupational History   Not on file  Tobacco Use   Smoking status: Never   Smokeless tobacco: Never  Vaping Use   Vaping Use: Never used  Substance and Sexual Activity   Alcohol use: No   Drug use: No   Sexual activity: Not Currently  Other Topics Concern   Not on file  Social History Narrative   Not on file   Social Determinants of Health   Financial Resource Strain: Low Risk  (02/20/2019)   Overall Financial Resource Strain (CARDIA)    Difficulty of Paying Living Expenses: Not hard at all  Food Insecurity: Unknown (02/20/2019)   Hunger Vital Sign    Worried About [place] in the Last Year: Patient  refused    [place] in the Last Year: Patient refused  Transportation Needs: Unknown (02/20/2019)   PRAPARE - Transportation    Lack of Transportation (Medical): Patient refused    Lack of Transportation (Non-Medical): Patient refused  Physical Activity: Unknown (02/20/2019)   Exercise Vital Sign    Days of Exercise per Week: Patient refused    Minutes of Exercise per Session: Patient refused  Stress: Stress Concern Present (02/20/2019)   [employer]    Feeling of Stress : Rather much  Social Connections: Moderately Isolated (02/20/2019)   Social Connection and Isolation Panel [NHANES]    Frequency of Communication with Friends and Family: More than three times a week    Frequency of Social Gatherings with Friends and Family: Twice a week    Attends Religious Services: 1 to 4 times per year    Active Member of [employer] or Organizations: No    Attends Archivist Meetings: Never    Marital Status: Widowed  Intimate Partner Violence: Unknown (02/20/2019)   Humiliation, Afraid, Rape, and Kick questionnaire    Fear of Current or Ex-Partner: Patient refused    Emotionally Abused: Patient refused    Physically Abused: Patient refused    Sexually Abused: Patient refused      ROS:  General: Negative for anorexia, weight loss, fever, chills, fatigue, weakness. Eyes: Negative for vision changes.  ENT: Negative for hoarseness, difficulty swallowing, nasal congestion. CV: Negative for chest pain, angina, palpitations, dyspnea on exertion, peripheral edema.  Respiratory: Negative for dyspnea at rest, dyspnea on exertion, cough, sputum, wheezing.  GI: See history of present illness. GU:  Negative for dysuria, hematuria, urinary incontinence, urinary frequency, nocturnal urination.  MS: Negative for joint pain, low back pain.  Derm: Negative for rash or itching.  Neuro: Negative for weakness, abnormal sensation,  seizure, frequent headaches, memory loss, confusion.  Psych: Negative for anxiety, depression, suicidal ideation, hallucinations.  Endo: Negative for unusual weight change.  Heme: Negative for bruising or bleeding. Allergy: Negative for rash or hives.   Observations/Objective:  Spoke with patient by phone. She did not sound in distress. Alert and oriented. No sob.   Assessment and Plan:  Acute onset abdominal pain, loose stools. States similar symptoms to when she was in hospital for GI bleeding and noted to have possible diverticulitis. At times has stool that is 1/2 black. Denies Pepto. On oral iron. She has not completed CBC, iron/tibc/ferritin. She went for labs with PCP but our labs were not done.   Empirically treat with cipro/flagyl. We will reach out to PCP to see if they did labs we requested, if not we will have her go for labs. If she has worsening abdominal pain, worsening weakness, black/tarry stools or a lot of blood in her stool she will go to the ER. We may have to push back her colonoscopy a couple of weeks due to possible diverticulitis.   Follow Up Instructions:    I discussed the assessment and treatment plan with the patient. The patient was provided an opportunity to ask questions and all were answered. The patient agreed with the plan and demonstrated an understanding of the instructions. AVS mailed to patient's home address.   The patient was advised to call back or seek an in-person evaluation if the symptoms worsen or if the condition fails to improve as anticipated.  I provided 15 minutes of non-face-to-face time during this encounter.   Neil Crouch, PA-C

## 2022-02-23 NOTE — Telephone Encounter (Signed)
Pt was made aware and verbalized understanding. Pt was scheduled for today @ 2:30.

## 2022-02-23 NOTE — Patient Instructions (Addendum)
For abdominal pain, possible diverticulitis, start cipro and flagyl for 10 days. Take with food.  We will contact PCP's office to see if they did all the labs we requested, if not, we will have you go back to complete. If you notice worsening abdominal pain, worsening weakness, black/tarry stools or a lot of blood in the stool, you should go to the ER.  Call in the next 48 hours and let us know how you are doing. We may have to push back your colonoscopy for a couple of weeks to allow you time to get better.

## 2022-02-23 NOTE — Telephone Encounter (Signed)
I recommend telephone visit today so that I can assess need for antibiotics and also to discuss probably need to reschedule colonoscopy if we think she has diverticulitis.

## 2022-02-26 ENCOUNTER — Other Ambulatory Visit: Payer: Self-pay

## 2022-02-26 ENCOUNTER — Telehealth: Payer: Self-pay | Admitting: Gastroenterology

## 2022-02-26 DIAGNOSIS — D649 Anemia, unspecified: Secondary | ICD-10-CM

## 2022-02-26 NOTE — Telephone Encounter (Signed)
Advised pt that provider wanted to move her colonoscopy back 2 weeks, that would put it into November and we don't have Dr.Carver's schedule for November at this time. We will call her to reschedule her colonoscopy once we get his schedule.

## 2022-02-26 NOTE — Telephone Encounter (Signed)
Received labs from PCP. Please let patient know her ferritin (iron stores) is normal at 64, her iron is 49, TIBC 266, iron sat 18%, which is normal.   The CBC was not done.   Can we arrange for her to have CBC? Dx: anemia.  We also need to push her colonoscopy back 2 weeks because of currently being treated for suspected diverticulitis.

## 2022-02-26 NOTE — Telephone Encounter (Signed)
Pt was made aware and verbalized understanding. Mailing lab order to patient to have completed at her pcp per pt's request.

## 2022-02-27 ENCOUNTER — Telehealth: Payer: Self-pay | Admitting: Internal Medicine

## 2022-02-27 NOTE — Telephone Encounter (Signed)
Talked with pt yesterday regarding her colonoscopy being postponed for another 2 weeks.

## 2022-02-27 NOTE — Telephone Encounter (Signed)
Pt returning call. [phone]

## 2022-03-06 ENCOUNTER — Telehealth: Payer: Self-pay | Admitting: *Deleted

## 2022-03-06 NOTE — Telephone Encounter (Signed)
Called pt to reschedule colonoscopy and she states that she is still hurting in the lower part of her stomach. Pt will make an appointment to come into office for evaluation.

## 2022-03-17 ENCOUNTER — Other Ambulatory Visit (HOSPITAL_COMMUNITY): Payer: Medicare Other

## 2022-03-20 ENCOUNTER — Ambulatory Visit (HOSPITAL_COMMUNITY): Admit: 2022-03-20 | Payer: Medicare Other

## 2022-03-20 ENCOUNTER — Encounter (HOSPITAL_COMMUNITY): Payer: Self-pay

## 2022-03-20 SURGERY — COLONOSCOPY WITH PROPOFOL
Anesthesia: Monitor Anesthesia Care

## 2022-03-25 LAB — CBC WITH DIFFERENTIAL/PLATELET
Absolute Monocytes: 827 cells/uL (ref 200–950)
Basophils Absolute: 26 cells/uL (ref 0–200)
Basophils Relative: 0.3 %
Eosinophils Absolute: 70 cells/uL (ref 15–500)
Eosinophils Relative: 0.8 %
HCT: 28.1 % — ABNORMAL LOW (ref 35.0–45.0)
Hemoglobin: 9.5 g/dL — ABNORMAL LOW (ref 11.7–15.5)
Lymphs Abs: 1749 cells/uL (ref 850–3900)
MCH: 31.6 pg (ref 27.0–33.0)
MCHC: 33.8 g/dL (ref 32.0–36.0)
MCV: 93.4 fL (ref 80.0–100.0)
MPV: 10.7 fL (ref 7.5–12.5)
Monocytes Relative: 9.5 %
Neutro Abs: 6029 cells/uL (ref 1500–7800)
Neutrophils Relative %: 69.3 %
Platelets: 336 10*3/uL (ref 140–400)
RBC: 3.01 10*6/uL — ABNORMAL LOW (ref 3.80–5.10)
RDW: 12.6 % (ref 11.0–15.0)
Total Lymphocyte: 20.1 %
WBC: 8.7 10*3/uL (ref 3.8–10.8)

## 2022-05-06 NOTE — H&P (View-Only) (Signed)
GI Office Note    Referring Provider: Renee Rival, NP Primary Care Physician:  Renee Rival, NP Primary Gastroenterologist: Elon Alas. Abbey Chatters, DO  Date:  05/07/2022  ID:  Amber Herman, DOB 1946/04/03, MRN [id number]   Chief Complaint   Chief Complaint  Patient presents with   Colonoscopy    She needs to see if she needs a colonoscopy. She is having issues with constipation. Her synthroid medication is making her sick to her stomach. She is also hurting in lower part of abdomen.    History of Present Illness  Amber Herman is a 76 y.o. female with a history of A-fib on Eliquis, HTN, NSTEMI in 2020, diabetes, hypothyroidism, TIA, anxiety, and anemia with baseline hemoglobin in the 10 range presenting today for follow up of lower abdominal pain and recent diverticulitis.  Prior hospitalization in May 2023 for anemia and dark stools.  Hemoglobin stable at 10.9, stool heme-negative.  CTA GI bleed showed questionable area of sigmoid diverticulitis.  She underwent EGD which showed multiple gastric polyps with stigmata of recent bleeding, polyps removed with hot snare and 3 clips placed at 2 different sites.  Pathology revealed hyperplastic gastric polyps, gastric biopsy with mild hyperemia but no H. pylori.  She was encouraged to have outpatient colonoscopy 6 to 8 weeks after discharge.  Visit 02/13/22 in the office patient reported constipation since her prior hospitalization, all day she only has 1 stool is typically hard.  Sometimes has loose stools 3 times a day.  Reported occasional heartburn, denies dysphagia, weight loss.  Recommend to have colonoscopy in the near future, start MiraLAX 1 capful daily.  Labs 03/25/22: Hemoglobin 9.5, MCV 93, platelets 336.  Last visit performed via telephone 02/23/22. Noted pain in the lower abdomen. Constipation reported previously, had recently been having watery stools.  Stools are sometimes have black.  Taking oral iron daily.   Denies need for daily physical use.  Denied nausea, vomiting, fever, or urinary symptoms.  She is empirically treated with Cipro/Flagyl.  Labs were requested from PCP office advised if she had worsening abdominal pain or black/tarry stools or BRBPR that she should go to the ED.  Colonoscopy was postponed by 2 weeks.  Patient called back 03/06/2022 reporting patient still having pain in her lower abdomen.  Colonoscopy has not yet been completed.   Today: Has a lot of gas and has been having trouble with having BM. States she has to sit there for a little while before she is able to go and then has lower abdominal pain. Pain does improve with having a bowel movement. Has tried miralax and that helps some. Has only been taking it as needed. Has seen dark stool and some green stools. Stools are soft. Denies melena or BRBPR. Has been taking her pantoprazole twice daily. Has a good appetite, denies unintentional weight loss. Used to have diarrhea but now she is having more constipation. [facility] and another exercise machine.   Does have some scattered bruising but denies hematemesis or epistaxis. Denies chest pain or shortness of breath. Denies peripheral edema.   Also reports her hgb has been low. Takes iron daily.  Reports her last colonoscopy was about 20+ years ago.   Current Outpatient Medications  Medication Sig Dispense Refill   acetaminophen (TYLENOL) 325 MG tablet Take 2 tablets (650 mg total) by mouth every 8 (eight) hours as needed. 60 tablet 0   amiodarone (PACERONE) 200 MG  tablet TAKE 1 TABLET BY MOUTH ONCE DAILY. (Patient taking differently: Take 200 mg by mouth daily.) 30 tablet 6   amLODipine (NORVASC) 5 MG tablet Take 5 mg by mouth daily.     apixaban (ELIQUIS) 5 MG TABS tablet Take 1 tablet (5 mg total) by mouth 2 (two) times daily. 60 tablet 1   FEROSUL 325 (65 Fe) MG tablet Take 325 mg by mouth daily.      levothyroxine (SYNTHROID) 100  MCG tablet Take 100 mcg by mouth daily.     losartan (COZAAR) 50 MG tablet Take 1 tablet (50 mg total) by mouth 2 (two) times daily. 180 tablet 3   magnesium oxide (MAG-OX) 400 (241.3 Mg) MG tablet TAKE 1 TABLET BY MOUTH ONCE DAILY. 28 tablet 11   metFORMIN (GLUCOPHAGE) 850 MG tablet Take 850 mg by mouth 2 (two) times daily with a meal.      metoprolol succinate (TOPROL-XL) 50 MG 24 hr tablet Take 25 mg by mouth daily.     pantoprazole (PROTONIX) 40 MG tablet Take 1 tablet (40 mg total) by mouth 2 (two) times daily before a meal. 60 tablet 2   polyethylene glycol powder (GLYCOLAX/MIRALAX) 17 GM/SCOOP powder Take one capful daily until soft stool, then continue once daily as needed to maintain soft stool. 527 g 3   pravastatin (PRAVACHOL) 40 MG tablet Take 40 mg by mouth at bedtime.      prazosin (MINIPRESS) 2 MG capsule Take 2 mg by mouth 2 (two) times daily. 1 in the am and 2 at bedtime     TRESIBA FLEXTOUCH 200 UNIT/ML SOPN Inject 16 Units into the skin at bedtime.      vitamin B-12 (CYANOCOBALAMIN) 1000 MCG tablet Take 1,000 mcg by mouth daily.      eplerenone (INSPRA) 25 MG tablet Take 25 mg by mouth daily.     Na Sulfate-K Sulfate-Mg Sulf 17.5-3.13-1.6 GM/177ML SOLN As directed (Patient not taking: Reported on 05/07/2022) 354 mL 0   No current facility-administered medications for this visit.    Past Medical History:  Diagnosis Date   Allergic rhinitis    Anemia    Atrial fibrillation ([place])    Diagnosed 2013   DM (diabetes mellitus), type 2 (HCC)    GERD (gastroesophageal reflux disease)    Hyperlipidemia    Hypertension    Nephrolithiasis    Rotator cuff tear    Thyroid disease    Transient ischemic attack (TIA)    Vitamin D deficiency     Past Surgical History:  Procedure Laterality Date   BIOPSY  10/05/2021   Procedure: BIOPSY;  Surgeon: Otis Brace, MD;  Location: [facility];  Service: Gastroenterology;;   CARDIOVERSION N/A 02/22/2019   Procedure: CARDIOVERSION;   Surgeon: Donato Heinz, MD;  Location: [facility] END[facility]COPY;  Service: Endoscopy;  Laterality: N/A;   CHOLECYSTECTOMY     COLONOSCOPY     normal per patient, more than 10 years ago   ESOPHAGOGASTRODUODENOSCOPY (EGD) WITH PROPOFOL N/A 10/05/2021   Procedure: ESOPHAGOGASTRODUODENOSCOPY (EGD) WITH PROPOFOL;  Surgeon: Otis Brace, MD;  Location: [facility] ENDOSCOPY;  Service: Gastroenterology;  Laterality: N/A;   HEMOSTASIS CLIP PLACEMENT  10/05/2021   Procedure: HEMOSTASIS CLIP PLACEMENT;  Surgeon: Otis Brace, MD;  Location: [facility] ENDOSCOPY;  Service: Gastroenterology;;   LEFT HEART CATH AND CORONARY ANGIOGRAPHY N/A 02/21/2019   Procedure: LEFT HEART CATH AND CORONARY ANGIOGRAPHY;  Surgeon: [name], MD;  Location: [facility];  Service: Cardiovascular;  Laterality: N/A;   POLYPECTOMY  10/05/2021   Procedure: POLYPECTOMY;  Surgeon: Otis Brace, MD;  Location: [facility] ENDOSCOPY;  Service: Gastroenterology;;   TEE WITHOUT CARDIOVERSION N/A 02/22/2019   Procedure: TRANSESOPHAGEAL ECHOCARDIOGRAM (TEE);  Surgeon: Donato Heinz, MD;  Location: [facility] ENDOSCOPY;  Service: Endoscopy;  Laterality: N/A;    Family History  Problem Relation Age of Onset   Heart disease Mother    Alzheimer's disease Mother    Diabetes Mother    Skin cancer Mother    Hypertension Father    Diabetes Father    Heart disease Brother    Breast cancer Daughter    Colon cancer Neg Hx     Allergies as of 05/07/2022 - Review Complete 05/07/2022  Allergen Reaction Noted   Angiotensin receptor blockers  11/25/2011   Calcium channel blockers  11/25/2011   Elemental sulfur Itching 07/24/2021   Latex Hives and Itching 02/21/2019   Minoxidil Nausea Only 04/17/2021   Nicardipine hcl  09/18/2014   Simvastatin  11/25/2011   Clonidine Other (See Comments) 08/18/2019   Sulfa antibiotics Itching and Rash 11/25/2011   Sulfacetamide sodium Itching and Rash 09/18/2014   Sulfasalazine Itching and Rash 11/25/2011     Social History   Socioeconomic History   Marital status: Widowed    Spouse name: Not on file   Number of children: Not on file   Years of education: Not on file   Highest education level: Not on file  Occupational History   Not on file  Tobacco Use   Smoking status: Never   Smokeless tobacco: Never  Vaping Use   Vaping Use: Never used  Substance and Sexual Activity   Alcohol use: No   Drug use: No   Sexual activity: Not Currently  Other Topics Concern   Not on file  Social History Narrative   Not on file   Social Determinants of Health   Financial Resource Strain: Low Risk  (02/20/2019)   Overall Financial Resource Strain (CARDIA)    Difficulty of Paying Living Expenses: Not hard at all  Food Insecurity: Unknown (02/20/2019)   Hunger Vital Sign    Worried About [place] in the Last Year: Patient refused    [place] in the Last Year: Patient refused  Transportation Needs: Unknown (02/20/2019)   PRAPARE - Transportation    Lack of Transportation (Medical): Patient refused    Lack of Transportation (Non-Medical): Patient refused  Physical Activity: Unknown (02/20/2019)   Exercise Vital Sign    Days of Exercise per Week: Patient refused    Minutes of Exercise per Session: Patient refused  Stress: Stress Concern Present (02/20/2019)   [employer] [place]    Feeling of Stress : Rather much  Social Connections: Moderately Isolated (02/20/2019)   Social Connection and Isolation Panel [NHANES]    Frequency of Communication with Friends and Family: More than three times a week    Frequency of Social Gatherings with Friends and Family: Twice a week    Attends Religious Services: 1 to 4 times per year    Active Member of [employer] or Organizations: No    Attends Archivist Meetings: Never    Marital Status: Widowed     Review of Systems   Gen: Denies fever, chills, anorexia. Denies fatigue,  weakness, weight loss.  CV: Denies chest pain, palpitations, syncope, peripheral edema, and claudication. Resp: Denies dyspnea at rest, cough, wheezing, coughing up blood, and pleurisy. GI: See HPI Derm: Denies rash,  itching, dry skin Psych: Denies depression, anxiety, memory loss, confusion. No homicidal or suicidal ideation.  Heme: Denies bruising, bleeding, and enlarged lymph nodes.   Physical Exam   BP (!) 183/65 (BP Location: Left Arm, Patient Position: Sitting, Cuff Size: Normal)   Temp 97.8 F (36.6 C) (Oral)   Ht '5\' 4"'$  (1.626 m)   Wt 141 lb 12.8 oz (64.3 kg)   SpO2 99%   BMI 24.34 kg/m   General:   Alert and oriented. No distress noted. Pleasant and cooperative.  Head:  Normocephalic and atraumatic. Eyes:  Conjuctiva clear without scleral icterus. Mouth:  Oral mucosa pink and moist. Good dentition. No lesions. Lungs:  Clear to auscultation bilaterally. No wheezes, rales, or rhonchi. No distress.  Heart:  S1, S2 present without murmurs appreciated.  Abdomen:  +BS, soft, non-distended. Mild ttp to RLQ and LLQ. No rebound or guarding. No HSM or masses noted. Rectal: deferred Msk:  Symmetrical without gross deformities. Normal posture. Extremities:  Without edema. Neurologic:  Alert and  oriented x4 Psych:  Alert and cooperative. Normal mood and affect.   Assessment  Amber Herman is a 76 y.o. female with a history of A-fib on Eliquis, HTN, NSTEMI in 2020, diabetes, hypothyroidism, TIA, anxiety, and anemia with baseline hemoglobin in the 10 range presenting today for follow up of lower abdominal pain and recent diverticulitis.  Normocytic anemia: Most recent Hgb 9.5.  Hemoglobin previously stable in the 10 range for the last 5 years.  MA reported dark stools and weakness, stool Hemoccult negative.  She has CTA GI bleed which revealed no bleeding but question of diverticulitis in sigmoid colon.  Previously underwent EGD which showed a few bleeding polyps within the stomach.   Patient reported last colonoscopy was 20+ years ago.  Recommended to have colonoscopy given concern on prior CT findings.  Sometimes dark stools, taking iron daily. No pepto use.  Given slight drop, we will proceed with colonoscopy given CT findings and anemia.  Will recheck CBC today given his been over 5 weeks since her last lab values and she is experiencing some fatigue.  She will continue PPI twice daily.  Alternating constipation/diarrhea: More recently constipation.  Used to have a history of looser stools.  Had only been using MiraLAX as needed, but does report it is helpful when she takes it.  Advised her that it is safe to take daily, which is what I have recommended today.  Continue to be active.  Possible recent diverticulitis: CT angio GI bleed in May noting questionable diverticulitis in the proximal sigmoid colon.  Developed abdominal pain in the end of September was treated empirically with Cipro and Flagyl. She was originally scheduled for colonoscopy in October was recommended to push out for 2 weeks, procedure has not yet been completed.  Will schedule colonoscopy today, patient is in agreement.  Advised to avoid constipation by taking MiraLAX 17 g daily.   PLAN   Proceed with colonoscopy with propofol by Dr. Abbey Chatters in near future: the risks, benefits, and alternatives have been discussed with the patient in detail. The patient states understanding and desires to proceed. ASA 3 Request clearance to hold Eliquis for 2 days prior to procedure Hold metformin the night prior to the morning of procedure Half normal dose of Tresiba the night prior to procedure Hold iron for 10 days prior to procedure Continue pantoprazole 40 mg twice daily Miralax 17g (1 capful) daily.  CBC Follow up in 3 months    Loma Sousa  Mickle Mallory, MSN, FNP-BC, AGACNP-BC [facility]

## 2022-05-06 NOTE — Progress Notes (Unsigned)
GI Office Note    Referring Provider: Renee Rival, NP Primary Care Physician:  Renee Rival, NP Primary Gastroenterologist: Elon Alas. Abbey Chatters, DO  Date:  05/07/2022  ID:  Amber Herman, DOB 1945/07/28, MRN [id number]   Chief Complaint   Chief Complaint  Patient presents with   Colonoscopy    She needs to see if she needs a colonoscopy. She is having issues with constipation. Her synthroid medication is making her sick to her stomach. She is also hurting in lower part of abdomen.    History of Present Illness  Amber Herman is a 76 y.o. female with a history of A-fib on Eliquis, HTN, NSTEMI in 2020, diabetes, hypothyroidism, TIA, anxiety, and anemia with baseline hemoglobin in the 10 range presenting today for follow up of lower abdominal pain and recent diverticulitis.  Prior hospitalization in May 2023 for anemia and dark stools.  Hemoglobin stable at 10.9, stool heme-negative.  CTA GI bleed showed questionable area of sigmoid diverticulitis.  She underwent EGD which showed multiple gastric polyps with stigmata of recent bleeding, polyps removed with hot snare and 3 clips placed at 2 different sites.  Pathology revealed hyperplastic gastric polyps, gastric biopsy with mild hyperemia but no H. pylori.  She was encouraged to have outpatient colonoscopy 6 to 8 weeks after discharge.  Visit 02/13/22 in the office patient reported constipation since her prior hospitalization, all day she only has 1 stool is typically hard.  Sometimes has loose stools 3 times a day.  Reported occasional heartburn, denies dysphagia, weight loss.  Recommend to have colonoscopy in the near future, start MiraLAX 1 capful daily.  Labs 03/25/22: Hemoglobin 9.5, MCV 93, platelets 336.  Last visit performed via telephone 02/23/22. Noted pain in the lower abdomen. Constipation reported previously, had recently been having watery stools.  Stools are sometimes have black.  Taking oral iron daily.   Denies need for daily physical use.  Denied nausea, vomiting, fever, or urinary symptoms.  She is empirically treated with Cipro/Flagyl.  Labs were requested from PCP office advised if she had worsening abdominal pain or black/tarry stools or BRBPR that she should go to the ED.  Colonoscopy was postponed by 2 weeks.  Patient called back 03/06/2022 reporting patient still having pain in her lower abdomen.  Colonoscopy has not yet been completed.   Today: Has a lot of gas and has been having trouble with having BM. States she has to sit there for a little while before she is able to go and then has lower abdominal pain. Pain does improve with having a bowel movement. Has tried miralax and that helps some. Has only been taking it as needed. Has seen dark stool and some green stools. Stools are soft. Denies melena or BRBPR. Has been taking her pantoprazole twice daily. Has a good appetite, denies unintentional weight loss. Used to have diarrhea but now she is having more constipation. [facility] and another exercise machine.   Does have some scattered bruising but denies hematemesis or epistaxis. Denies chest pain or shortness of breath. Denies peripheral edema.   Also reports her hgb has been low. Takes iron daily.  Reports her last colonoscopy was about 20+ years ago.   Current Outpatient Medications  Medication Sig Dispense Refill   acetaminophen (TYLENOL) 325 MG tablet Take 2 tablets (650 mg total) by mouth every 8 (eight) hours as needed. 60 tablet 0   amiodarone (PACERONE) 200 MG  tablet TAKE 1 TABLET BY MOUTH ONCE DAILY. (Patient taking differently: Take 200 mg by mouth daily.) 30 tablet 6   amLODipine (NORVASC) 5 MG tablet Take 5 mg by mouth daily.     apixaban (ELIQUIS) 5 MG TABS tablet Take 1 tablet (5 mg total) by mouth 2 (two) times daily. 60 tablet 1   FEROSUL 325 (65 Fe) MG tablet Take 325 mg by mouth daily.      levothyroxine (SYNTHROID) 100  MCG tablet Take 100 mcg by mouth daily.     losartan (COZAAR) 50 MG tablet Take 1 tablet (50 mg total) by mouth 2 (two) times daily. 180 tablet 3   magnesium oxide (MAG-OX) 400 (241.3 Mg) MG tablet TAKE 1 TABLET BY MOUTH ONCE DAILY. 28 tablet 11   metFORMIN (GLUCOPHAGE) 850 MG tablet Take 850 mg by mouth 2 (two) times daily with a meal.      metoprolol succinate (TOPROL-XL) 50 MG 24 hr tablet Take 25 mg by mouth daily.     pantoprazole (PROTONIX) 40 MG tablet Take 1 tablet (40 mg total) by mouth 2 (two) times daily before a meal. 60 tablet 2   polyethylene glycol powder (GLYCOLAX/MIRALAX) 17 GM/SCOOP powder Take one capful daily until soft stool, then continue once daily as needed to maintain soft stool. 527 g 3   pravastatin (PRAVACHOL) 40 MG tablet Take 40 mg by mouth at bedtime.      prazosin (MINIPRESS) 2 MG capsule Take 2 mg by mouth 2 (two) times daily. 1 in the am and 2 at bedtime     TRESIBA FLEXTOUCH 200 UNIT/ML SOPN Inject 16 Units into the skin at bedtime.      vitamin B-12 (CYANOCOBALAMIN) 1000 MCG tablet Take 1,000 mcg by mouth daily.      eplerenone (INSPRA) 25 MG tablet Take 25 mg by mouth daily.     Na Sulfate-K Sulfate-Mg Sulf 17.5-3.13-1.6 GM/177ML SOLN As directed (Patient not taking: Reported on 05/07/2022) 354 mL 0   No current facility-administered medications for this visit.    Past Medical History:  Diagnosis Date   Allergic rhinitis    Anemia    Atrial fibrillation ([place])    Diagnosed 2013   DM (diabetes mellitus), type 2 (HCC)    GERD (gastroesophageal reflux disease)    Hyperlipidemia    Hypertension    Nephrolithiasis    Rotator cuff tear    Thyroid disease    Transient ischemic attack (TIA)    Vitamin D deficiency     Past Surgical History:  Procedure Laterality Date   BIOPSY  10/05/2021   Procedure: BIOPSY;  Surgeon: Otis Brace, MD;  Location: [facility];  Service: Gastroenterology;;   CARDIOVERSION N/A 02/22/2019   Procedure: CARDIOVERSION;   Surgeon: Donato Heinz, MD;  Location: [facility] ENDOSCOPY;  Service: Endoscopy;  Laterality: N/A;   CHOLECYSTECTOMY     COLONOSCOPY     normal per patient, more than 10 years ago   ESOPHAGOGASTRODUODENOSCOPY (EGD) WITH PROPOFOL N/A 10/05/2021   Procedure: ESOPHAGOGASTRODUODENOSCOPY (EGD) WITH PROPOFOL;  Surgeon: Otis Brace, MD;  Location: [facility] ENDOSCOPY;  Service: Gastroenterology;  Laterality: N/A;   HEMOSTASIS CLIP PLACEMENT  10/05/2021   Procedure: HEMOSTASIS CLIP PLACEMENT;  Surgeon: Otis Brace, MD;  Location: [facility] ENDOSCOPY;  Service: Gastroenterology;;   LEFT HEART CATH AND CORONARY ANGIOGRAPHY N/A 02/21/2019   Procedure: LEFT HEART CATH AND CORONARY ANGIOGRAPHY;  Surgeon: [name], MD;  Location: [facility];  Service: Cardiovascular;  Laterality: N/A;   POLYPECTOMY  10/05/2021   Procedure: POLYPECTOMY;  Surgeon: Otis Brace, MD;  Location: [facility] ENDOSCOPY;  Service: Gastroenterology;;   TEE WITHOUT CARDIOVERSION N/A 02/22/2019   Procedure: TRANSESOPHAGEAL ECHOCARDIOGRAM (TEE);  Surgeon: Donato Heinz, MD;  Location[facility] ENDOSCOPY;  Service: Endoscopy;  Laterality: N/A;    Family History  Problem Relation Age of Onset   Heart disease Mother    Alzheimer's disease Mother    Diabetes Mother    Skin cancer Mother    Hypertension Father    Diabetes Father    Heart disease Brother    Breast cancer Daughter    Colon cancer Neg Hx     Allergies as of 05/07/2022 - Review Complete 05/07/2022  Allergen Reaction Noted   Angiotensin receptor blockers  11/25/2011   Calcium channel blockers  11/25/2011   Elemental sulfur Itching 07/24/2021   Latex Hives and Itching 02/21/2019   Minoxidil Nausea Only 04/17/2021   Nicardipine hcl  09/18/2014   Simvastatin  11/25/2011   Clonidine Other (See Comments) 08/18/2019   Sulfa antibiotics Itching and Rash 11/25/2011   Sulfacetamide sodium Itching and Rash 09/18/2014   Sulfasalazine Itching and Rash 11/25/2011     Social History   Socioeconomic History   Marital status: Widowed    Spouse name: Not on file   Number of children: Not on file   Years of education: Not on file   Highest education level: Not on file  Occupational History   Not on file  Tobacco Use   Smoking status: Never   Smokeless tobacco: Never  Vaping Use   Vaping Use: Never used  Substance and Sexual Activity   Alcohol use: No   Drug use: No   Sexual activity: Not Currently  Other Topics Concern   Not on file  Social History Narrative   Not on file   Social Determinants of Health   Financial Resource Strain: Low Risk  (02/20/2019)   Overall Financial Resource Strain (CARDIA)    Difficulty of Paying Living Expenses: Not hard at all  Food Insecurity: Unknown (02/20/2019)   Hunger Vital Sign    Worried About [place] in the Last Year: Patient refused    [place] in the Last Year: Patient refused  Transportation Needs: Unknown (02/20/2019)   PRAPARE - Transportation    Lack of Transportation (Medical): Patient refused    Lack of Transportation (Non-Medical): Patient refused  Physical Activity: Unknown (02/20/2019)   Exercise Vital Sign    Days of Exercise per Week: Patient refused    Minutes of Exercise per Session: Patient refused  Stress: Stress Concern Present (02/20/2019)   [employer] [place]    Feeling of Stress : Rather much  Social Connections: Moderately Isolated (02/20/2019)   Social Connection and Isolation Panel [NHANES]    Frequency of Communication with Friends and Family: More than three times a week    Frequency of Social Gatherings with Friends and Family: Twice a week    Attends Religious Services: 1 to 4 times per year    Active Member of [employer] or Organizations: No    Attends Archivist Meetings: Never    Marital Status: Widowed     Review of Systems   Gen: Denies fever, chills, anorexia. Denies fatigue,  weakness, weight loss.  CV: Denies chest pain, palpitations, syncope, peripheral edema, and claudication. Resp: Denies dyspnea at rest, cough, wheezing, coughing up blood, and pleurisy. GI: See HPI Derm: Denies rash,  itching, dry skin Psych: Denies depression, anxiety, memory loss, confusion. No homicidal or suicidal ideation.  Heme: Denies bruising, bleeding, and enlarged lymph nodes.   Physical Exam   BP (!) 183/65 (BP Location: Left Arm, Patient Position: Sitting, Cuff Size: Normal)   Temp 97.8 F (36.6 C) (Oral)   Ht '5\' 4"'$  (1.626 m)   Wt 141 lb 12.8 oz (64.3 kg)   SpO2 99%   BMI 24.34 kg/m   General:   Alert and oriented. No distress noted. Pleasant and cooperative.  Head:  Normocephalic and atraumatic. Eyes:  Conjuctiva clear without scleral icterus. Mouth:  Oral mucosa pink and moist. Good dentition. No lesions. Lungs:  Clear to auscultation bilaterally. No wheezes, rales, or rhonchi. No distress.  Heart:  S1, S2 present without murmurs appreciated.  Abdomen:  +BS, soft, non-distended. Mild ttp to RLQ and LLQ. No rebound or guarding. No HSM or masses noted. Rectal: deferred Msk:  Symmetrical without gross deformities. Normal posture. Extremities:  Without edema. Neurologic:  Alert and  oriented x4 Psych:  Alert and cooperative. Normal mood and affect.   Assessment  Amber Herman is a 76 y.o. female with a history of A-fib on Eliquis, HTN, NSTEMI in 2020, diabetes, hypothyroidism, TIA, anxiety, and anemia with baseline hemoglobin in the 10 range presenting today for follow up of lower abdominal pain and recent diverticulitis.  Normocytic anemia: Most recent Hgb 9.5.  Hemoglobin previously stable in the 10 range for the last 5 years.  MA reported dark stools and weakness, stool Hemoccult negative.  She has CTA GI bleed which revealed no bleeding but question of diverticulitis in sigmoid colon.  Previously underwent EGD which showed a few bleeding polyps within the stomach.   Patient reported last colonoscopy was 20+ years ago.  Recommended to have colonoscopy given concern on prior CT findings.  Sometimes dark stools, taking iron daily. No pepto use.  Given slight drop, we will proceed with colonoscopy given CT findings and anemia.  Will recheck CBC today given his been over 5 weeks since her last lab values and she is experiencing some fatigue.  She will continue PPI twice daily.  Alternating constipation/diarrhea: More recently constipation.  Used to have a history of looser stools.  Had only been using MiraLAX as needed, but does report it is helpful when she takes it.  Advised her that it is safe to take daily, which is what I have recommended today.  Continue to be active.  Possible recent diverticulitis: CT angio GI bleed in May noting questionable diverticulitis in the proximal sigmoid colon.  Developed abdominal pain in the end of September was treated empirically with Cipro and Flagyl. She was originally scheduled for colonoscopy in October was recommended to push out for 2 weeks, procedure has not yet been completed.  Will schedule colonoscopy today, patient is in agreement.  Advised to avoid constipation by taking MiraLAX 17 g daily.   PLAN   Proceed with colonoscopy with propofol by Dr. Abbey Chatters in near future: the risks, benefits, and alternatives have been discussed with the patient in detail. The patient states understanding and desires to proceed. ASA 3 Request clearance to hold Eliquis for 2 days prior to procedure Hold metformin the night prior to the morning of procedure Half normal dose of Tresiba the night prior to procedure Hold iron for 10 days prior to procedure Continue pantoprazole 40 mg twice daily Miralax 17g (1 capful) daily.  CBC Follow up in 3 months    Loma Sousa  Mickle Mallory, MSN, FNP-BC, AGACNP-BC [facility]

## 2022-05-07 ENCOUNTER — Ambulatory Visit (INDEPENDENT_AMBULATORY_CARE_PROVIDER_SITE_OTHER): Payer: Medicare Other | Admitting: Gastroenterology

## 2022-05-07 ENCOUNTER — Telehealth: Payer: Self-pay | Admitting: *Deleted

## 2022-05-07 ENCOUNTER — Encounter: Payer: Self-pay | Admitting: Gastroenterology

## 2022-05-07 VITALS — BP 183/65 | Temp 97.8°F | Ht 64.0 in | Wt 141.8 lb

## 2022-05-07 DIAGNOSIS — D649 Anemia, unspecified: Secondary | ICD-10-CM

## 2022-05-07 DIAGNOSIS — K5900 Constipation, unspecified: Secondary | ICD-10-CM

## 2022-05-07 DIAGNOSIS — Z8719 Personal history of other diseases of the digestive system: Secondary | ICD-10-CM

## 2022-05-07 NOTE — Telephone Encounter (Signed)
  Request for patient to stop medication prior to procedure or is needing cleareance  05/07/22  [place] [birth year]  What type of surgery is being performed? Colonoscopy  When is surgery scheduled? TBD  What type of clearance is required (medical or pharmacy to hold medication or both? Hold medication  Are there any medications that need to be held prior to surgery and how long? Eliquis for 2 day  Name of physician performing surgery?  [facility] [employer]: [phone] Fax: [phone]  Anethesia type (none, local, MAC, general)? MAC

## 2022-05-07 NOTE — Telephone Encounter (Signed)
Request has been faxed to [facility].

## 2022-05-07 NOTE — Telephone Encounter (Signed)
Not a [facility] pt, she follows with [place] [facility]. Clearance should come from managing provider.

## 2022-05-07 NOTE — Telephone Encounter (Signed)
   Patient Name: Amber Herman  DOB: Nov 03, 1945 MRN: [id number]  Primary Cardiologist: Rozann Lesches, MD  Chart reviewed as part of pre-operative protocol coverage.  Patient does not actively follow with [place] [facility].  Patient follows with [facility].  Therefore, recommendations for holding Eliquis prior to surgery should come from managing provider.  I will route this recommendation to the requesting party via Epic fax function and remove from pre-op pool.  Please call with questions.  Lenna Sciara, NP 05/07/2022, 1:38 PM

## 2022-05-07 NOTE — Patient Instructions (Addendum)
For fatigue you can add Vitamin D to your regimen.   We are going to recheck your hemoglobin.  You may have this done at your PCP office.  We are providing with the lab slips today.  We are scheduling you for colonoscopy in the near future with Dr. Abbey Chatters.  We are requesting clearance to hold Eliquis for 2 days, once we have received approval we will call you to schedule.  You will receive separate written instructions in the mail.  Instructions: Hold Eliquis for 2 days prior to procedure Hold metformin the night prior to the morning of procedure Half normal dose of Tresiba the night prior to procedure Hold iron for 10 days prior to procedure  Allergy to start taking MiraLAX 17 g (1 capful) daily.  This is to help with your constipation.  If needed you may increase to twice daily.  Continue taking your pantoprazole 40 mg twice daily.  We will plan to follow-up in 3 months, or sooner if needed.  I Hope you have a wonderful Christmas and had been a year!  It was a pleasure to see you today. I want to create trusting relationships with patients. If you receive a survey regarding your visit,  I greatly appreciate you taking time to fill this out on paper or through your MyChart. I value your feedback.  Venetia Night, MSN, FNP-BC, AGACNP-BC [facility]

## 2022-05-08 NOTE — Telephone Encounter (Signed)
Clearance received to hold Eliquis for 2 days. Placed on providers desk.

## 2022-05-09 LAB — CBC
Hematocrit: 30.1 % — ABNORMAL LOW (ref 34.0–46.6)
Hemoglobin: 9.8 g/dL — ABNORMAL LOW (ref 11.1–15.9)
MCH: 30.8 pg (ref 26.6–33.0)
MCHC: 32.6 g/dL (ref 31.5–35.7)
MCV: 95 fL (ref 79–97)
Platelets: 409 10*3/uL (ref 150–450)
RBC: 3.18 x10E6/uL — ABNORMAL LOW (ref 3.77–5.28)
RDW: 12.3 % (ref 11.7–15.4)
WBC: 16.3 10*3/uL — ABNORMAL HIGH (ref 3.4–10.8)

## 2022-05-11 ENCOUNTER — Encounter: Payer: Self-pay | Admitting: *Deleted

## 2022-05-13 ENCOUNTER — Encounter: Payer: Self-pay | Admitting: *Deleted

## 2022-05-13 MED ORDER — PEG 3350-KCL-NA BICARB-NACL 420 G PO SOLR: 4000.0000 mL | Freq: Once | ORAL | 0 refills | Status: AC

## 2022-05-27 NOTE — Patient Instructions (Addendum)
[place]  05/27/2022     '@PREFPERIOPPHARMACY'$ @   Your procedure is scheduled on  06/02/2022.   Report to [facility] at  0600  A.M.   Call this number if you have problems the morning of surgery:  [phone]  If you experience any cold or flu symptoms such as cough, fever, chills, shortness of breath, etc. between now and your scheduled surgery, please notify us at the above number.   Remember:  Follow the diet and prep instructions given to you by the office.      Take 1/2 of your usual night time insulin the night before your procedure.     DO NOT take any medications for diabetes the morning of your procedure.        Your last dose of eliquis should be on 05/30/2022.        Your last dose of iron should have been on 05/22/2022.    Take these medicines the morning of surgery with A SIP OF WATER        amiodarone, amlodipine, levothyroxine, metoprolol, protonix.    Do not wear jewelry, make-up or nail polish.  Do not wear lotions, powders, or perfumes, or deodorant.  Do not shave 48 hours prior to surgery.  Men may shave face and neck.  Do not bring valuables to the hospital.  [facility] is not responsible for any belongings or valuables.  Contacts, dentures or bridgework may not be worn into surgery.  Leave your suitcase in the car.  After surgery it may be brought to your room.  For patients admitted to the hospital, discharge time will be determined by your treatment team.  Patients discharged the day of surgery will not be allowed to drive home and must have someone with them for 24 hours.    Special instructions:   DO NOT smoke tobacco or vape for 24 hours before your procedure.  Please read over the following fact sheets that you were given. Anesthesia Post-op Instructions and Care and Recovery After Surgery      Colonoscopy, Adult, Care After The following information offers guidance on how to care for yourself after your procedure.  Your health care provider may also give you more specific instructions. If you have problems or questions, contact your health care provider. What can I expect after the procedure? After the procedure, it is common to have: A small amount of blood in your stool for 24 hours after the procedure. Some gas. Mild cramping or bloating of your abdomen. Follow these instructions at home: Eating and drinking  Drink enough fluid to keep your urine pale yellow. Follow instructions from your health care provider about eating or drinking restrictions. Resume your normal diet as told by your health care provider. Avoid heavy or fried foods that are hard to digest. Activity Rest as told by your health care provider. Avoid sitting for a long time without moving. Get up to take short walks every 1-2 hours. This is important to improve blood flow and breathing. Ask for help if you feel weak or unsteady. Return to your normal activities as told by your health care provider. Ask your health care provider what activities are safe for you. Managing cramping and bloating  Try walking around when you have cramps or feel bloated. If directed, apply heat to your abdomen as told by your health care provider. Use the heat source that your health care provider recommends, such as a moist  heat pack or a heating pad. Place a towel between your skin and the heat source. Leave the heat on for 20-30 minutes. Remove the heat if your skin turns bright red. This is especially important if you are unable to feel pain, heat, or cold. You have a greater risk of getting burned. General instructions If you were given a sedative during the procedure, it can affect you for several hours. Do not drive or operate machinery until your health care provider says that it is safe. For the first 24 hours after the procedure: Do not sign important documents. Do not drink alcohol. Do your regular daily activities at a slower pace than  normal. Eat soft foods that are easy to digest. Take over-the-counter and prescription medicines only as told by your health care provider. Keep all follow-up visits. This is important. Contact a health care provider if: You have blood in your stool 2-3 days after the procedure. Get help right away if: You have more than a small spotting of blood in your stool. You have large blood clots in your stool. You have swelling of your abdomen. You have nausea or vomiting. You have a fever. You have increasing pain in your abdomen that is not relieved with medicine. These symptoms may be an emergency. Get help right away. Call 911. Do not wait to see if the symptoms will go away. Do not drive yourself to the hospital. Summary After the procedure, it is common to have a small amount of blood in your stool. You may also have mild cramping and bloating of your abdomen. If you were given a sedative during the procedure, it can affect you for several hours. Do not drive or operate machinery until your health care provider says that it is safe. Get help right away if you have a lot of blood in your stool, nausea or vomiting, a fever, or increased pain in your abdomen. This information is not intended to replace advice given to you by your health care provider. Make sure you discuss any questions you have with your health care provider. Document Revised: 01/08/2021 Document Reviewed: 01/08/2021 Elsevier Patient Education  [place] After The following information offers guidance on how to care for yourself after your procedure. Your health care provider may also give you more specific instructions. If you have problems or questions, contact your health care provider. What can I expect after the procedure? After the procedure, it is common to have: Tiredness. Little or no memory about what happened during or after the procedure. Impaired judgment when it comes  to making decisions. Nausea or vomiting. Some trouble with balance. Follow these instructions at home: For the time period you were told by your health care provider:  Rest. Do not participate in activities where you could fall or become injured. Do not drive or use machinery. Do not drink alcohol. Do not take sleeping pills or medicines that cause drowsiness. Do not make important decisions or sign legal documents. Do not take care of children on your own. Medicines Take over-the-counter and prescription medicines only as told by your health care provider. If you were prescribed antibiotics, take them as told by your health care provider. Do not stop using the antibiotic even if you start to feel better. Eating and drinking Follow instructions from your health care provider about what you may eat and drink. Drink enough fluid to keep your urine pale yellow. If you vomit: Drink clear fluids slowly  and in small amounts as you are able. Clear fluids include water, ice chips, low-calorie sports drinks, and fruit juice that has water added to it (diluted fruit juice). Eat light and bland foods in small amounts as you are able. These foods include bananas, applesauce, rice, lean meats, toast, and crackers. General instructions  Have a responsible adult stay with you for the time you are told. It is important to have someone help care for you until you are awake and alert. If you have sleep apnea, surgery and some medicines can increase your risk for breathing problems. Follow instructions from your health care provider about wearing your sleep device: When you are sleeping. This includes during daytime naps. While taking prescription pain medicines, sleeping medicines, or medicines that make you drowsy. Do not use any products that contain nicotine or tobacco. These products include cigarettes, chewing tobacco, and vaping devices, such as e-cigarettes. If you need help quitting, ask your  health care provider. Contact a health care provider if: You feel nauseous or vomit every time you eat or drink. You feel light-headed. You are still sleepy or having trouble with balance after 24 hours. You get a rash. You have a fever. You have redness or swelling around the IV site. Get help right away if: You have trouble breathing. You have new confusion after you get home. These symptoms may be an emergency. Get help right away. Call 911. Do not wait to see if the symptoms will go away. Do not drive yourself to the hospital. This information is not intended to replace advice given to you by your health care provider. Make sure you discuss any questions you have with your health care provider. Document Revised: 10/13/2021 Document Reviewed: 10/13/2021 Elsevier Patient Education  [place].

## 2022-05-28 ENCOUNTER — Encounter (HOSPITAL_COMMUNITY)
Admission: RE | Admit: 2022-05-28 | Discharge: 2022-05-28 | Disposition: A | Discharge status: Home / Self Care | Payer: Medicare Other | Source: Ambulatory Visit | Attending: Internal Medicine | Admitting: Internal Medicine

## 2022-05-28 ENCOUNTER — Encounter (HOSPITAL_COMMUNITY): Payer: Self-pay

## 2022-05-28 VITALS — BP 193/60 | HR 55 | Temp 97.8°F | Resp 18 | Ht 64.0 in | Wt 141.0 lb

## 2022-05-28 DIAGNOSIS — E119 Type 2 diabetes mellitus without complications: Secondary | ICD-10-CM | POA: Insufficient documentation

## 2022-05-28 DIAGNOSIS — Z01812 Encounter for preprocedural laboratory examination: Secondary | ICD-10-CM | POA: Diagnosis present

## 2022-05-28 HISTORY — DX: Hypothyroidism, unspecified: E03.9

## 2022-05-28 LAB — BASIC METABOLIC PANEL
Anion gap: 9 (ref 5–15)
BUN: 24 mg/dL — ABNORMAL HIGH (ref 8–23)
CO2: 25 mmol/L (ref 22–32)
Calcium: 8.8 mg/dL — ABNORMAL LOW (ref 8.9–10.3)
Chloride: 98 mmol/L (ref 98–111)
Creatinine, Ser: 1.65 mg/dL — ABNORMAL HIGH (ref 0.44–1.00)
GFR, Estimated: 32 mL/min — ABNORMAL LOW (ref >60)
Glucose, Bld: 104 mg/dL — ABNORMAL HIGH (ref 70–99)
Potassium: 4.5 mmol/L (ref 3.5–5.1)
Sodium: 132 mmol/L — ABNORMAL LOW (ref 135–145)

## 2022-06-02 ENCOUNTER — Ambulatory Visit (HOSPITAL_COMMUNITY)
Admission: RE | Admit: 2022-06-02 | Discharge: 2022-06-02 | Disposition: A | Discharge status: Home / Self Care | Payer: Medicare Other | Source: Ambulatory Visit | Attending: Internal Medicine | Admitting: Internal Medicine

## 2022-06-02 ENCOUNTER — Encounter (HOSPITAL_COMMUNITY)
Admission: RE | Disposition: A | Discharge status: Home / Self Care | Payer: Self-pay | Source: Ambulatory Visit | Attending: Internal Medicine

## 2022-06-02 ENCOUNTER — Ambulatory Visit (HOSPITAL_BASED_OUTPATIENT_CLINIC_OR_DEPARTMENT_OTHER): Payer: Medicare Other | Admitting: Anesthesiology

## 2022-06-02 ENCOUNTER — Ambulatory Visit (HOSPITAL_COMMUNITY): Payer: Medicare Other | Admitting: Anesthesiology

## 2022-06-02 ENCOUNTER — Encounter (HOSPITAL_COMMUNITY): Payer: Self-pay

## 2022-06-02 DIAGNOSIS — Z7984 Long term (current) use of oral hypoglycemic drugs: Secondary | ICD-10-CM | POA: Insufficient documentation

## 2022-06-02 DIAGNOSIS — I4891 Unspecified atrial fibrillation: Secondary | ICD-10-CM | POA: Insufficient documentation

## 2022-06-02 DIAGNOSIS — K5730 Diverticulosis of large intestine without perforation or abscess without bleeding: Secondary | ICD-10-CM | POA: Insufficient documentation

## 2022-06-02 DIAGNOSIS — Z79890 Hormone replacement therapy (postmenopausal): Secondary | ICD-10-CM | POA: Insufficient documentation

## 2022-06-02 DIAGNOSIS — Z8673 Personal history of transient ischemic attack (TIA), and cerebral infarction without residual deficits: Secondary | ICD-10-CM | POA: Diagnosis not present

## 2022-06-02 DIAGNOSIS — Z8719 Personal history of other diseases of the digestive system: Secondary | ICD-10-CM | POA: Insufficient documentation

## 2022-06-02 DIAGNOSIS — K219 Gastro-esophageal reflux disease without esophagitis: Secondary | ICD-10-CM | POA: Insufficient documentation

## 2022-06-02 DIAGNOSIS — K5732 Diverticulitis of large intestine without perforation or abscess without bleeding: Secondary | ICD-10-CM

## 2022-06-02 DIAGNOSIS — R1032 Left lower quadrant pain: Secondary | ICD-10-CM | POA: Insufficient documentation

## 2022-06-02 DIAGNOSIS — Z79899 Other long term (current) drug therapy: Secondary | ICD-10-CM | POA: Diagnosis not present

## 2022-06-02 DIAGNOSIS — D122 Benign neoplasm of ascending colon: Secondary | ICD-10-CM

## 2022-06-02 DIAGNOSIS — K5900 Constipation, unspecified: Secondary | ICD-10-CM | POA: Diagnosis not present

## 2022-06-02 DIAGNOSIS — D120 Benign neoplasm of cecum: Secondary | ICD-10-CM

## 2022-06-02 DIAGNOSIS — E119 Type 2 diabetes mellitus without complications: Secondary | ICD-10-CM | POA: Diagnosis not present

## 2022-06-02 DIAGNOSIS — Z833 Family history of diabetes mellitus: Secondary | ICD-10-CM | POA: Diagnosis not present

## 2022-06-02 DIAGNOSIS — Z794 Long term (current) use of insulin: Secondary | ICD-10-CM | POA: Insufficient documentation

## 2022-06-02 DIAGNOSIS — I252 Old myocardial infarction: Secondary | ICD-10-CM | POA: Diagnosis not present

## 2022-06-02 DIAGNOSIS — Z7901 Long term (current) use of anticoagulants: Secondary | ICD-10-CM | POA: Diagnosis not present

## 2022-06-02 DIAGNOSIS — I10 Essential (primary) hypertension: Secondary | ICD-10-CM | POA: Diagnosis not present

## 2022-06-02 DIAGNOSIS — E039 Hypothyroidism, unspecified: Secondary | ICD-10-CM | POA: Diagnosis not present

## 2022-06-02 DIAGNOSIS — D649 Anemia, unspecified: Secondary | ICD-10-CM | POA: Insufficient documentation

## 2022-06-02 DIAGNOSIS — Z8249 Family history of ischemic heart disease and other diseases of the circulatory system: Secondary | ICD-10-CM | POA: Insufficient documentation

## 2022-06-02 HISTORY — PX: BIOPSY: SHX5522

## 2022-06-02 HISTORY — PX: COLONOSCOPY WITH PROPOFOL: SHX5780

## 2022-06-02 HISTORY — PX: POLYPECTOMY: SHX5525

## 2022-06-02 LAB — GLUCOSE, CAPILLARY: Glucose-Capillary: 127 mg/dL — ABNORMAL HIGH (ref 70–99)

## 2022-06-02 SURGERY — COLONOSCOPY WITH PROPOFOL
Anesthesia: General

## 2022-06-02 MED ORDER — LACTATED RINGERS IV SOLN: INTRAVENOUS | Status: DC | PRN

## 2022-06-02 MED ORDER — PROPOFOL 10 MG/ML IV BOLUS
INTRAVENOUS | Status: DC | PRN
  Administered 2022-06-02: 75 mg via INTRAVENOUS

## 2022-06-02 MED ORDER — PROPOFOL 500 MG/50ML IV EMUL
INTRAVENOUS | Status: DC | PRN
  Administered 2022-06-02: 150 ug/kg/min via INTRAVENOUS

## 2022-06-02 NOTE — Op Note (Signed)
[facility] Patient Name: Amber Herman Procedure Date: 06/02/2022 7:07 AM MRN: [id number] Date of Birth: [birth year] Attending MD: Elon Alas. Abbey Chatters , [place], [id number] CSN: [id number] Age: 77 Admit Type: Outpatient Procedure:                Colonoscopy Indications:              Abdominal pain in the left lower quadrant,                            Follow-up of diverticulitis, Constipation Providers:                Elon Alas. Abbey Chatters, DO, Rosina Lowenstein, RN, Ladoris Gene Technician, Technician Referring MD:              Medicines:                See the Anesthesia note for documentation of the                            administered medications Complications:            No immediate complications. Estimated Blood Loss:     Estimated blood loss was minimal. Procedure:                Pre-Anesthesia Assessment:                           - The anesthesia plan was to use monitored                            anesthesia care (MAC).                           After obtaining informed consent, the colonoscope                            was passed under direct vision. Throughout the                            procedure, the patient's blood pressure, pulse, and                            oxygen saturations were monitored continuously. The                            [id number]-[id number] ([id number]) was introduced through the                            anus and advanced to the the cecum, identified by                            appendiceal orifice and ileocecal valve. The                            colonoscopy was performed without difficulty.  The                            patient tolerated the procedure well. The quality                            of the bowel preparation was evaluated using the                            BBPS [facility] Bowel Preparation Scale) with scores                            of: Right Colon = 2 (minor amount of residual                            staining,  small fragments of stool and/or opaque                            liquid, but mucosa seen well), Transverse Colon = 2                            (minor amount of residual staining, small fragments                            of stool and/or opaque liquid, but mucosa seen                            well) and Left Colon = 2 (minor amount of residual                            staining, small fragments of stool and/or opaque                            liquid, but mucosa seen well). The total BBPS score                            equals 6. Fair. Scope In: 7:55:02 AM Scope Out: 8:10:54 AM Scope Withdrawal Time: 0 hours 11 minutes 0 seconds  Total Procedure Duration: 0 hours 15 minutes 52 seconds  Findings:      The perianal and digital rectal examinations were normal.      Many large-mouthed and small-mouthed diverticula were found in the       entire colon.      A 2 mm polyp was found in the cecum. The polyp was sessile. The polyp       was removed with a cold biopsy forceps. Resection and retrieval were       complete.      A 6 mm polyp was found in the ascending colon. The polyp was sessile.       The polyp was removed with a cold snare. Resection and retrieval were       complete.      There is no endoscopic evidence of erythema or inflammation in the       entire colon. Impression:               -  Diverticulosis in the entire examined colon.                           - One 2 mm polyp in the cecum, removed with a cold                            biopsy forceps. Resected and retrieved.                           - One 6 mm polyp in the ascending colon, removed                            with a cold snare. Resected and retrieved. Moderate Sedation:      Per Anesthesia Care Recommendation:           - Patient has a contact number available for                            emergencies. The signs and symptoms of potential                            delayed complications were discussed with the                             patient. Return to normal activities tomorrow.                            Written discharge instructions were provided to the                            patient.                           - Resume previous diet.                           - Continue present medications.                           - Await pathology results.                           - No repeat colonoscopy due to age.                           - Return to GI clinic in 3 months. Procedure Code(s):        --- Professional ---                           [phone], Colonoscopy, flexible; with removal of                            tumor(s), polyp(s), or other lesion(s) by snare  technique                           [id number], 59, Colonoscopy, flexible; with biopsy,                            single or multiple Diagnosis Code(s):        --- Professional ---                           D12.0, Benign neoplasm of cecum                           D12.2, Benign neoplasm of ascending colon                           R10.32, Left lower quadrant pain                           K57.32, Diverticulitis of large intestine without                            perforation or abscess without bleeding                           K59.00, Constipation, unspecified                           K57.30, Diverticulosis of large intestine without                            perforation or abscess without bleeding CPT copyright 2022 American Medical Association. All rights reserved. The codes documented in this report are preliminary and upon coder review may  be revised to meet current compliance requirements. Elon Alas. Abbey Chatters, DO [place] Abbey Chatters, DO 06/02/2022 8:14:35 AM This report has been signed electronically. Number of Addenda: 0

## 2022-06-02 NOTE — Discharge Instructions (Addendum)
  Colonoscopy Discharge Instructions  Read the instructions outlined below and refer to this sheet in the next few weeks. These discharge instructions provide you with general information on caring for yourself after you leave the hospital. Your doctor may also give you specific instructions. While your treatment has been planned according to the most current medical practices available, unavoidable complications occasionally occur.   ACTIVITY You may resume your regular activity, but move at a slower pace for the next 24 hours.  Take frequent rest periods for the next 24 hours.  Walking will help get rid of the air and reduce the bloated feeling in your belly (abdomen).  No driving for 24 hours (because of the medicine (anesthesia) used during the test).   Do not sign any important legal documents or operate any machinery for 24 hours (because of the anesthesia used during the test).  NUTRITION Drink plenty of fluids.  You may resume your normal diet as instructed by your doctor.  Begin with a light meal and progress to your normal diet. Heavy or fried foods are harder to digest and may make you feel sick to your stomach (nauseated).  Avoid alcoholic beverages for 24 hours or as instructed.  MEDICATIONS You may resume your normal medications unless your doctor tells you otherwise.  WHAT YOU CAN EXPECT TODAY Some feelings of bloating in the abdomen.  Passage of more gas than usual.  Spotting of blood in your stool or on the toilet paper.  IF YOU HAD POLYPS REMOVED DURING THE COLONOSCOPY: No aspirin products for 7 days or as instructed.  No alcohol for 7 days or as instructed.  Eat a soft diet for the next 24 hours.  FINDING OUT THE RESULTS OF YOUR TEST Not all test results are available during your visit. If your test results are not back during the visit, make an appointment with your caregiver to find out the results. Do not assume everything is normal if you have not heard from your  caregiver or the medical facility. It is important for you to follow up on all of your test results.  SEEK IMMEDIATE MEDICAL ATTENTION IF: You have more than a spotting of blood in your stool.  Your belly is swollen (abdominal distention).  You are nauseated or vomiting.  You have a temperature over 101.  You have abdominal pain or discomfort that is severe or gets worse throughout the day.   Your colonoscopy revealed 2 polyp(s) which I removed successfully. Await pathology results, my office will contact you.  Given your age, I think it is reasonable to not pursue further colonoscopy for polyp surveillance.  Overall your colon looked very healthy.  I did not see any active inflammation throughout your entire colon.  You also have diverticulosis and internal hemorrhoids. I would recommend increasing fiber in your diet or adding OTC Benefiber/Metamucil. Be sure to drink at least 4 to 6 glasses of water daily. Follow-up with GI in 2-3 months   I hope you have a great rest of your week!  Elon Alas. Abbey Chatters, D.O. Gastroenterology and Hepatology [facility]

## 2022-06-02 NOTE — Transfer of Care (Signed)
Immediate Anesthesia Transfer of Care Note  Patient: Amber Herman  Procedure(s) Performed: COLONOSCOPY WITH PROPOFOL BIOPSY POLYPECTOMY  Patient Location: Short Stay  Anesthesia Type:General  Level of Consciousness: awake  Airway & Oxygen Therapy: Patient Spontanous Breathing  Post-op Assessment: Report given to RN  Post vital signs: Reviewed  Last Vitals:  Vitals Value Taken Time  BP 111/33 06/02/22 0817  Temp 36.3 C 06/02/22 0817  Pulse 53 06/02/22 0817  Resp 18 06/02/22 0817  SpO2 96 % 06/02/22 0817    Last Pain:  Vitals:   06/02/22 0817  TempSrc: Axillary  PainSc: 0-No pain      Patients Stated Pain Goal: 9 (34/96/11 6435)  Complications: No notable events documented.

## 2022-06-02 NOTE — Anesthesia Postprocedure Evaluation (Signed)
Anesthesia Post Note  Patient: Amber Herman  Procedure(s) Performed: COLONOSCOPY WITH PROPOFOL BIOPSY POLYPECTOMY  Patient location during evaluation: Short Stay Anesthesia Type: General Level of consciousness: awake and alert Pain management: pain level controlled Vital Signs Assessment: post-procedure vital signs reviewed and stable Respiratory status: spontaneous breathing Cardiovascular status: blood pressure returned to baseline and stable Postop Assessment: no apparent nausea or vomiting Anesthetic complications: no   No notable events documented.   Last Vitals:  Vitals:   06/02/22 0645 06/02/22 0817  BP: (!) 189/55 (!) 111/33  Pulse: 66 (!) 53  Resp: 17 18  Temp: 36.6 C (!) 36.3 C  SpO2: 100% 96%    Last Pain:  Vitals:   06/02/22 0817  TempSrc: Axillary  PainSc: 0-No pain                 J[place]ica Inthavong

## 2022-06-02 NOTE — Interval H&P Note (Signed)
History and Physical Interval Note:  06/02/2022 7:42 AM  [place]  has presented today for surgery, with the diagnosis of constipaton, EX:[id number].  The various methods of treatment have been discussed with the patient and family. After consideration of risks, benefits and other options for treatment, the patient has consented to  Procedure(s) with comments: COLONOSCOPY WITH PROPOFOL (N/A) - 7:30 am as a surgical intervention.  The patient's history has been reviewed, patient examined, no change in status, stable for surgery.  I have reviewed the patient's chart and labs.  Questions were answered to the patient's satisfaction.     Eloise Harman

## 2022-06-02 NOTE — Anesthesia Postprocedure Evaluation (Signed)
Anesthesia Post Note  Patient: [employer]  Procedure(s) Performed: COLONOSCOPY WITH PROPOFOL BIOPSY POLYPECTOMY  Patient location during evaluation: PACU Anesthesia Type: General Level of consciousness: awake and alert Pain management: pain level controlled Vital Signs Assessment: post-procedure vital signs reviewed and stable Respiratory status: spontaneous breathing, nonlabored ventilation, respiratory function stable and patient connected to nasal cannula oxygen Cardiovascular status: blood pressure returned to baseline and stable Postop Assessment: no apparent nausea or vomiting Anesthetic complications: no   There were no known notable events for this encounter.   Last Vitals:  Vitals:   06/02/22 0817 06/02/22 0821  BP: (!) 111/33 (!) 107/42  Pulse: (!) 53   Resp: 18   Temp: (!) 36.3 C   SpO2: 96%     Last Pain:  Vitals:   06/02/22 0817  TempSrc: Axillary  PainSc: 0-No pain                 Nicanor Alcon

## 2022-06-02 NOTE — Anesthesia Preprocedure Evaluation (Signed)
Anesthesia Evaluation  Patient identified by MRN, date of birth, ID band Patient awake    Reviewed: Allergy & Precautions, NPO status , Patient's Chart, lab work & pertinent test results, reviewed documented beta blocker date and time   History of Anesthesia Complications Negative for: history of anesthetic complications  Airway Mallampati: II  TM Distance: >3 FB Neck ROM: Full    Dental  (+) Dental Advisory Given   Pulmonary neg pulmonary ROS   Pulmonary exam normal        Cardiovascular hypertension, Pt. on medications and Pt. on home beta blockers + Past MI  Normal cardiovascular exam+ dysrhythmias Atrial Fibrillation    '21 Myoperfusion - Normal study, no signs of ischemia  '20 TTE - EF 65 to 70%. There is moderately increased left ventricular hypertrophy. Diastolic Doppler parameters are consistent with impaired relaxation pattern of LV diastolic filling. Trace mitral valve regurgitation. Tricuspid valve regurgitation is trivial. Mild aortic valve sclerosis without stenosis.     Neuro/Psych TIA negative psych ROS   GI/Hepatic Neg liver ROS,GERD  Medicated and Controlled,, GI bleed    Endo/Other  diabetes, Type 2, Oral Hypoglycemic AgentsHypothyroidism    Renal/GU negative Renal ROS     Musculoskeletal negative musculoskeletal ROS (+)    Abdominal   Peds  Hematology  (+) Blood dyscrasia, anemia  On eliquis    Anesthesia Other Findings   Reproductive/Obstetrics                              Anesthesia Physical Anesthesia Plan  ASA: 3  Anesthesia Plan: General   Post-op Pain Management: Minimal or no pain anticipated   Induction:   PONV Risk Score and Plan: 2 and Propofol infusion and Treatment may vary due to age or medical condition  Airway Management Planned: Nasal Cannula and Natural Airway  Additional Equipment: None  Intra-op Plan:   Post-operative Plan:    Informed Consent: I have reviewed the patients History and Physical, chart, labs and discussed the procedure including the risks, benefits and alternatives for the proposed anesthesia with the patient or authorized representative who has indicated his/her understanding and acceptance.       Plan Discussed with: CRNA  Anesthesia Plan Comments:          Anesthesia Quick Evaluation

## 2022-06-03 LAB — SURGICAL PATHOLOGY

## 2022-06-09 ENCOUNTER — Encounter (HOSPITAL_COMMUNITY): Payer: Self-pay | Admitting: Internal Medicine

## 2022-08-04 NOTE — Progress Notes (Deleted)
GI Office Note    Referring Provider: Renee Rival, NP Primary Care Physician:  Renee Rival, NP Primary Gastroenterologist: Elon Alas. Abbey Chatters, DO  Date:  08/04/2022  ID:  Amber Herman, DOB [birth year], MRN [id number]   Chief Complaint   No chief complaint on file.   History of Present Illness  SHAWNELL Herman is a 77 y.o. female with a history of Afib on Eliquis, HTN, NSTEMI in 2020, diabetes, hypothyroidism, TIA, anxiety, and anemia with baseline hgb in 10 range*** presenting today for follow up.  Prior hospitalization in May 2023 for anemia and dark stools.  Hemoglobin stable at 10.9, stool heme-negative.  CTA GI bleed showed questionable area of sigmoid diverticulitis.  She underwent EGD which showed multiple gastric polyps with stigmata of recent bleeding, polyps removed with hot snare and 3 clips placed at 2 different sites.  Pathology revealed hyperplastic gastric polyps, gastric biopsy with mild hyperemia but no H. pylori.  She was encouraged to have outpatient colonoscopy 6 to 8 weeks after discharge.   Visit 02/13/22 in the office patient reported constipation since her prior hospitalization, all day she only has 1 stool is typically hard.  Sometimes has loose stools 3 times a day.  Reported occasional heartburn, denies dysphagia, weight loss.  Recommend to have colonoscopy in the near future, start MiraLAX 1 capful daily.   Labs 03/25/22: Hemoglobin 9.5, MCV 93, platelets 336.   Visit performed via telephone 02/23/22. Noted pain in the lower abdomen.Constipation reported previously, had recently been having watery stools.  Stools are sometimes have black.  Taking oral iron daily.  Denies need for daily physical use.  Denied nausea, vomiting, fever, or urinary symptoms.  She is empirically treated with Cipro/Flagyl.  Labs were requested from PCP office advised if she had worsening abdominal pain or black/tarry stools or BRBPR that she should go to the ED.  Colonoscopy  was postponed by 2 weeks.   Patient called back 03/06/2022 reporting patient still having pain in her lower abdomen.  Last office visit 05/07/22. Having lots of gas and trouble with constipation. Sitting on commode for long time periods. Has associated lower abdominal pain that improves with defecation. Miralax had helped some. Good appetite. Taking pantoprazole BID. Exercising regularly. Taking iron dialy. No chest pain or shortness of breath. Scheduled for colonoscopy. Continue PPI BID and miralax 1 capful daily. Check CBC given fatigue and anemia. Prior diverticulitis in September 2023.  Colonoscopy 06/02/22: -pan colonic diverticulosis -46m polyp in cecum -657mascending colon polyp -polyps sessile serrated adenomas.  -no repeat colonoscopy due to age.   Labs 07/23/22: Hgb 10.4, MCV 94, Plts 374, Na 132, Cr 1.4. Normal LFTs. TSH 0.8, T4 -1.46  Today: Constipation -    Current Outpatient Medications  Medication Sig Dispense Refill   acetaminophen (TYLENOL) 325 MG tablet Take 2 tablets (650 mg total) by mouth every 8 (eight) hours as needed. 60 tablet 0   amiodarone (PACERONE) 200 MG tablet TAKE 1 TABLET BY MOUTH ONCE DAILY. 30 tablet 6   amLODipine (NORVASC) 5 MG tablet Take 5 mg by mouth daily.     apixaban (ELIQUIS) 5 MG TABS tablet Take 1 tablet (5 mg total) by mouth 2 (two) times daily. 60 tablet 1   eplerenone (INSPRA) 25 MG tablet Take 25 mg by mouth daily.     FEROSUL 325 (65 Fe) MG tablet Take 325 mg by mouth daily.      levothyroxine (SYNTHROID) 100 MCG tablet Take 100 mcg  by mouth daily.     losartan (COZAAR) 50 MG tablet Take 1 tablet (50 mg total) by mouth 2 (two) times daily. 180 tablet 3   magnesium oxide (MAG-OX) 400 (241.3 Mg) MG tablet TAKE 1 TABLET BY MOUTH ONCE DAILY. 28 tablet 11   metFORMIN (GLUCOPHAGE) 850 MG tablet Take 850 mg by mouth 2 (two) times daily with a meal.      metoprolol succinate (TOPROL-XL) 50 MG 24 hr tablet Take 50 mg by mouth daily.      pantoprazole (PROTONIX) 40 MG tablet Take 1 tablet (40 mg total) by mouth 2 (two) times daily before a meal. 60 tablet 2   polyethylene glycol powder (GLYCOLAX/MIRALAX) 17 GM/SCOOP powder Take one capful daily until soft stool, then continue once daily as needed to maintain soft stool. (Patient taking differently: Take 17 g by mouth daily as needed for moderate constipation.) 527 g 3   pravastatin (PRAVACHOL) 40 MG tablet Take 40 mg by mouth at bedtime.      prazosin (MINIPRESS) 2 MG capsule Take 4 mg by mouth at bedtime.     TRESIBA FLEXTOUCH 200 UNIT/ML SOPN Inject 0-20 Units into the skin at bedtime.     vitamin B-12 (CYANOCOBALAMIN) 1000 MCG tablet Take 1,000 mcg by mouth daily.      No current facility-administered medications for this visit.    Past Medical History:  Diagnosis Date   Allergic rhinitis    Anemia    Atrial fibrillation ([place])    Diagnosed 2013   DM (diabetes mellitus), type 2 (HCC)    GERD (gastroesophageal reflux disease)    Hyperlipidemia    Hypertension    Hypothyroidism    Nephrolithiasis    Rotator cuff tear    Thyroid disease    Transient ischemic attack (TIA)    Vitamin D deficiency     Past Surgical History:  Procedure Laterality Date   BIOPSY  10/05/2021   Procedure: BIOPSY;  Surgeon: Otis Brace, MD;  Location: [facility];  Service: Gastroenterology;;   BIOPSY  06/02/2022   Procedure: BIOPSY;  Surgeon: Eloise Harman, DO;  Location: [facility];  Service: Endoscopy;;   CARDIOVERSION N/A 02/22/2019   Procedure: CARDIOVERSION;  Surgeon: Donato Heinz, MD;  Location[facility] ENDOSCOPY;  Service: Endoscopy;  Laterality: N/A;   CHOLECYSTECTOMY     COLONOSCOPY     normal per patient, more than 10 years ago   COLONOSCOPY WITH PROPOFOL N/A 06/02/2022   Procedure: COLONOSCOPY WITH PROPOFOL;  Surgeon: Eloise Harman, DO;  Location: [facility];  Service: Endoscopy;  Laterality: N/A;  7:30 am   ESOPHAGOGASTRODUODENOSCOPY (EGD) WITH PROPOFOL  N/A 10/05/2021   Procedure: ESOPHAGOGASTRODUODENOSCOPY (EGD) WITH PROPOFOL;  Surgeon: Otis Brace, MD;  Location: [facility];  Service: Gastroenterology;  Laterality: N/A;   HEMOSTASIS CLIP PLACEMENT  10/05/2021   Procedure: HEMOSTASIS CLIP PLACEMENT;  Surgeon: Otis Brace, MD;  Location: [facility];  Service: Gastroenterology;;   LEFT HEART CATH AND CORONARY ANGIOGRAPHY N/A 02/21/2019   Procedure: LEFT HEART CATH AND CORONARY ANGIOGRAPHY;  Surgeon: [name], MD;  Location: [place] [facility] LAB;  Service: Cardiovascular;  Laterality: N/A;   POLYPECTOMY  10/05/2021   Procedure: POLYPECTOMY;  Surgeon: Otis Brace, MD;  Location: [facility];  Service: Gastroenterology;;   POLYPECTOMY  06/02/2022   Procedure: POLYPECTOMY;  Surgeon: Eloise Harman, DO;  Location: [facility];  Service: Endoscopy;;   TEE WITHOUT CARDIOVERSION N/A 02/22/2019   Procedure: TRANSESOPHAGEAL ECHOCARDIOGRAM (TEE);  Surgeon: Gardiner Rhyme,  Doreatha Martin, MD;  Location: [facility] ENDOSCOPY;  Service: Endoscopy;  Laterality: N/A;    Family History  Problem Relation Age of Onset   Heart disease Mother    Alzheimer's disease Mother    Diabetes Mother    Skin cancer Mother    Hypertension Father    Diabetes Father    Heart disease Brother    Breast cancer Daughter    Colon cancer Neg Hx     Allergies as of 08/06/2022 - Review Complete 06/02/2022  Allergen Reaction Noted   Angiotensin receptor blockers  11/25/2011   Calcium channel blockers  11/25/2011   Elemental sulfur Itching 07/24/2021   Latex Hives and Itching 02/21/2019   Minoxidil Nausea Only 04/17/2021   Nicardipine hcl  09/18/2014   Simvastatin  11/25/2011   Clonidine Other (See Comments) 08/18/2019   Sulfa antibiotics Itching and Rash 11/25/2011   Sulfacetamide sodium Itching and Rash 09/18/2014   Sulfasalazine Itching and Rash 11/25/2011    Social History   Socioeconomic History   Marital status: Widowed    Spouse name: Not on file    Number of children: Not on file   Years of education: Not on file   Highest education level: Not on file  Occupational History   Not on file  Tobacco Use   Smoking status: Never   Smokeless tobacco: Never  Vaping Use   Vaping Use: Never used  Substance and Sexual Activity   Alcohol use: No   Drug use: No   Sexual activity: Not Currently  Other Topics Concern   Not on file  Social History Narrative   Not on file   Social Determinants of Health   Financial Resource Strain: Low Risk  (02/20/2019)   Overall Financial Resource Strain (CARDIA)    Difficulty of Paying Living Expenses: Not hard at all  Food Insecurity: Unknown (02/20/2019)   Hunger Vital Sign    Worried About [place] in the Last Year: Patient refused    [place] in the Last Year: Patient refused  Transportation Needs: Unknown (02/20/2019)   PRAPARE - Transportation    Lack of Transportation (Medical): Patient refused    Lack of Transportation (Non-Medical): Patient refused  Physical Activity: Unknown (02/20/2019)   Exercise Vital Sign    Days of Exercise per Week: Patient refused    Minutes of Exercise per Session: Patient refused  Stress: Stress Concern Present (02/20/2019)   [employer] [place]    Feeling of Stress : Rather much  Social Connections: Moderately Isolated (02/20/2019)   Social Connection and Isolation Panel [NHANES]    Frequency of Communication with Friends and Family: More than three times a week    Frequency of Social Gatherings with Friends and Family: Twice a week    Attends Religious Services: 1 to 4 times per year    Active Member of [employer] or Organizations: No    Attends Archivist Meetings: Never    Marital Status: Widowed     Review of Systems   Gen: Denies fever, chills, anorexia. Denies fatigue, weakness, weight loss.  CV: Denies chest pain, palpitations, syncope, peripheral edema, and  claudication. Resp: Denies dyspnea at rest, cough, wheezing, coughing up blood, and pleurisy. GI: See HPI Derm: Denies rash, itching, dry skin Psych: Denies depression, anxiety, memory loss, confusion. No homicidal or suicidal ideation.  Heme: Denies bruising, bleeding, and enlarged lymph nodes.   Physical Exam   There were no  vitals taken for this visit.  General:   Alert and oriented. No distress noted. Pleasant and cooperative.  Head:  Normocephalic and atraumatic. Eyes:  Conjuctiva clear without scleral icterus. Mouth:  Oral mucosa pink and moist. Good dentition. No lesions. Lungs:  Clear to auscultation bilaterally. No wheezes, rales, or rhonchi. No distress.  Heart:  S1, S2 present without murmurs appreciated.  Abdomen:  +BS, soft, non-tender and non-distended. No rebound or guarding. No HSM or masses noted. Rectal: *** Msk:  Symmetrical without gross deformities. Normal posture. Extremities:  Without edema. Neurologic:  Alert and  oriented x4 Psych:  Alert and cooperative. Normal mood and affect.   Assessment  Amber Herman is a 77 y.o. female with a history of Afib on Eliquis, HTN, NSTEMI in 2020, diabetes, hypothyroidism, TIA, anxiety, and anemia with baseline hgb in 10 range*** presenting today for follow up.  Normocytic anemia:  Constipation:  PLAN   ***     Venetia Night, MSN, FNP-BC, AGACNP-BC [facility]

## 2022-08-06 ENCOUNTER — Ambulatory Visit: Payer: Medicare Other | Admitting: Gastroenterology

## 2022-08-17 NOTE — Progress Notes (Unsigned)
GI Office Note    Referring Provider: Renee Rival, NP Primary Care Physician:  Renee Rival, NP Primary Gastroenterologist: Elon Alas. Abbey Chatters, DO  Date:  08/18/2022  ID:  Amber Herman, DOB 1945-11-10, MRN [id number]   Chief Complaint   Chief Complaint  Patient presents with   Follow-up    Patient here today for a follow up appointment due to history of anemia. Patient says she stays cold. She denies any sight of dark or bloody stools. She says she does have some issues with dizziness,denies any shortness of breath.Last Hgb was done on 05/08/2022 at 9.8. She takes ferrous sulfate 325 daily. Patient says she got sick on her way here due to motion sickness and wants to know if we can prescribe something for dizziness/ motion sickness. She says she has taken otc nauzene. Had recent TCS done     History of Present Illness  Amber Herman is a 77 y.o. female with a history of Afib on Eliquis, HTN, NSTEMI in 2020, diabetes, hypothyroidism, TIA, anxiety, and anemia with baseline hgb in 10 range presenting today for follow up.   Prior hospitalization in May 2023 for anemia and dark stools.  Hemoglobin stable at 10.9, stool heme-negative.  CTA GI bleed showed questionable area of sigmoid diverticulitis.  She underwent EGD which showed multiple gastric polyps with stigmata of recent bleeding, polyps removed with hot snare and 3 clips placed at 2 different sites.  Pathology revealed hyperplastic gastric polyps, gastric biopsy with mild hyperemia but no H. pylori.  She was encouraged to have outpatient colonoscopy 6 to 8 weeks after discharge.   Visit 02/13/22 in the office patient reported constipation since her prior hospitalization, all day she only has 1 stool is typically hard.  Sometimes has loose stools 3 times a day.  Reported occasional heartburn, denies dysphagia, weight loss.  Recommend to have colonoscopy in the near future, start MiraLAX 1 capful daily.   Labs 03/25/22:  Hemoglobin 9.5, MCV 93, platelets 336.   Visit performed via telephone 02/23/22. Noted pain in the lower abdomen. Constipation reported previously, had recently been having watery stools.  Stools are sometimes have black.  Taking oral iron daily.  Denies need for daily physical use.  Denied nausea, vomiting, fever, or urinary symptoms.  She is empirically treated with Cipro/Flagyl.  Labs were requested from PCP office advised if she had worsening abdominal pain or black/tarry stools or BRBPR that she should go to the ED.  Colonoscopy was postponed by 2 weeks.   Patient called back 03/06/2022 reporting patient still having pain in her lower abdomen.   Last office visit 05/07/22. Having lots of gas and trouble with constipation. Sitting on commode for long time periods. Has associated lower abdominal pain that improves with defecation. Miralax had helped some. Good appetite. Taking pantoprazole BID. Exercising regularly. Taking iron dialy. No chest pain or shortness of breath. Scheduled for colonoscopy. Continue PPI BID and miralax 1 capful daily. Check CBC given fatigue and anemia. Prior diverticulitis in September 2023.   Colonoscopy 06/02/22: -pan colonic diverticulosis -45mm polyp in cecum -41mm ascending colon polyp -polyps sessile serrated adenomas.  -no repeat colonoscopy due to age.    Labs 07/23/22: Hgb 10.4, MCV 94, Plts 374, Na 132, Cr 1.4. Normal LFTs. TSH 0.8, T4 -1.46   Today: Constipation - Has a BM once per day, sometimes more. Sometimes she feels not empty. Is not having loose stools. Taking miralax as needed and a daily probiotic. Reports  she drinks more sodas than water. Is trying to remember to mix some sugar free packets into her water for taste.   Nausea - Has been having issues with motion sickness. She states she started on prazosin nightly and she was waking up dizzy in the mornings with taking 2 of those and now she is tacking one and still experiencing it some. Prior to starting  that medication she was not having any dizziness.   Has been having dark stools but has been taking iron once daily. No brbpr. No issues with hemorrhoids. Does have fatigue but is about the same as it has been. Still exercising everyday - gets on the treadmill and exercise bike and usually does about 1-3 miles. [facility].  No chest pain or shortness of breath. Appetite is pretty good. Blood sugars have been good on her tresiba.   No reflux or dysphagia.    Current Outpatient Medications  Medication Sig Dispense Refill   acetaminophen (TYLENOL) 325 MG tablet Take 2 tablets (650 mg total) by mouth every 8 (eight) hours as needed. 60 tablet 0   amiodarone (PACERONE) 200 MG tablet TAKE 1 TABLET BY MOUTH ONCE DAILY. 30 tablet 6   amLODipine (NORVASC) 5 MG tablet Take 5 mg by mouth 2 (two) times daily.     apixaban (ELIQUIS) 5 MG TABS tablet Take 1 tablet (5 mg total) by mouth 2 (two) times daily. 60 tablet 1   eplerenone (INSPRA) 25 MG tablet Take 25 mg by mouth daily.     ergocalciferol (VITAMIN D2) 1.25 MG (50000 UT) capsule Take 50,000 Units by mouth once a week.     FEROSUL 325 (65 Fe) MG tablet Take 325 mg by mouth daily.      losartan (COZAAR) 50 MG tablet Take 1 tablet (50 mg total) by mouth 2 (two) times daily. 180 tablet 3   magnesium oxide (MAG-OX) 400 (241.3 Mg) MG tablet TAKE 1 TABLET BY MOUTH ONCE DAILY. 28 tablet 11   metFORMIN (GLUCOPHAGE) 850 MG tablet Take 850 mg by mouth 2 (two) times daily with a meal.      metoprolol succinate (TOPROL-XL) 50 MG 24 hr tablet Take 50 mg by mouth daily.     pantoprazole (PROTONIX) 40 MG tablet Take 1 tablet (40 mg total) by mouth 2 (two) times daily before a meal. 60 tablet 2   polyethylene glycol powder (GLYCOLAX/MIRALAX) 17 GM/SCOOP powder Take one capful daily until soft stool, then continue once daily as needed to maintain soft stool. (Patient taking differently: Take 17 g by mouth daily as needed for moderate constipation.) 527  g 3   pravastatin (PRAVACHOL) 40 MG tablet Take 40 mg by mouth at bedtime.      prazosin (MINIPRESS) 2 MG capsule Take 4 mg by mouth at bedtime.     TRESIBA FLEXTOUCH 200 UNIT/ML SOPN Inject 0-20 Units into the skin at bedtime.     vitamin B-12 (CYANOCOBALAMIN) 1000 MCG tablet Take 1,000 mcg by mouth daily.      levothyroxine (SYNTHROID) 100 MCG tablet Take 100 mcg by mouth daily.     No current facility-administered medications for this visit.    Past Medical History:  Diagnosis Date   Allergic rhinitis    Anemia    Atrial fibrillation ([place])    Diagnosed 2013   DM (diabetes mellitus), type 2 (HCC)    GERD (gastroesophageal reflux disease)    Hyperlipidemia    Hypertension    Hypothyroidism  Nephrolithiasis    Rotator cuff tear    Thyroid disease    Transient ischemic attack (TIA)    Vitamin D deficiency     Past Surgical History:  Procedure Laterality Date   BIOPSY  10/05/2021   Procedure: BIOPSY;  Surgeon: Otis Brace, MD;  Location: [facility];  Service: Gastroenterology;;   BIOPSY  06/02/2022   Procedure: BIOPSY;  Surgeon: Eloise Harman, DO;  Location: [facility];  Service: Endoscopy;;   CARDIOVERSION N/A 02/22/2019   Procedure: CARDIOVERSION;  Surgeon: Donato Heinz, MD;  Location: [facility];  Service: Endoscopy;  Laterality: N/A;   CHOLECYSTECTOMY     COLONOSCOPY     normal per patient, more than 10 years ago   COLONOSCOPY WITH PROPOFOL N/A 06/02/2022   Procedure: COLONOSCOPY WITH PROPOFOL;  Surgeon: Eloise Harman, DO;  [facility]: [facility];  Service: Endoscopy;  Laterality: N/A;  7:30 am   ESOPHAGOGASTRODUODENOSCOPY (EGD) WITH PROPOFOL N/A 10/05/2021   Procedure: ESOPHAGOGASTRODUODENOSCOPY (EGD) WITH PROPOFOL;  Surgeon: Otis Brace, MD;  Location: [facility];  Service: Gastroenterology;  Laterality: N/A;   HEMOSTASIS CLIP PLACEMENT  10/05/2021   Procedure: HEMOSTASIS CLIP PLACEMENT;  Surgeon: Otis Brace, MD;  Location: [facility];  Service: Gastroenterology;;   LEFT HEART CATH AND CORONARY ANGIOGRAPHY N/A 02/21/2019   Procedure: LEFT HEART CATH AND CORONARY ANGIOGRAPHY;  Surgeon: [name], MD;  Location: [facility];  Service: Cardiovascular;  Laterality: N/A;   POLYPECTOMY  10/05/2021   Procedure: POLYPECTOMY;  Surgeon: Otis Brace, MD;  Location: [facility];  Service: Gastroenterology;;   POLYPECTOMY  06/02/2022   Procedure: POLYPECTOMY;  Surgeon: Eloise Harman, DO;  [facility];  Service: Endoscopy;;   TEE WITHOUT CARDIOVERSION N/A 02/22/2019   Procedure: TRANSESOPHAGEAL ECHOCARDIOGRAM (TEE);  Surgeon: Donato Heinz, MD;  Location: [facility];  Service: Endoscopy;  Laterality: N/A;    Family History  Problem Relation Age of Onset   Heart disease Mother    Alzheimer's disease Mother    Diabetes Mother    Skin cancer Mother    Hypertension Father    Diabetes Father    Heart disease Brother    Breast cancer Daughter    Colon cancer Neg Hx     Allergies as of 08/18/2022 - Review Complete 08/18/2022  Allergen Reaction Noted   Angiotensin receptor blockers  11/25/2011   Calcium channel blockers  11/25/2011   Elemental sulfur Itching 07/24/2021   Latex Hives and Itching 02/21/2019   Minoxidil Nausea Only 04/17/2021   Nicardipine hcl  09/18/2014   Simvastatin  11/25/2011   Clonidine Other (See Comments) 08/18/2019   Sulfa antibiotics Itching and Rash 11/25/2011   Sulfacetamide sodium Itching and Rash 09/18/2014   Sulfasalazine Itching and Rash 11/25/2011    Social History   Socioeconomic History   Marital status: Widowed    Spouse name: Not on file   Number of children: Not on file   Years of education: Not on file   Highest education level: Not on file  Occupational History   Not on file  Tobacco Use   Smoking status: Never   Smokeless tobacco: Never  Vaping Use   Vaping Use: Never used  Substance and Sexual Activity   Alcohol use: No    Drug use: No   Sexual activity: Not Currently  Other Topics Concern   Not on file  Social History Narrative   Not on file   Social Determinants of Health   Financial  Resource Strain: Low Risk  (02/20/2019)   Overall Financial Resource Strain (CARDIA)    Difficulty of Paying Living Expenses: Not hard at all  Food Insecurity: Unknown (02/20/2019)   Hunger Vital Sign    Worried About Running Out of Food in the Last Year: Patient declined    [place] in the Last Year: Patient declined  Transportation Needs: Unknown (02/20/2019)   [place] - Hydrologist (Medical): Patient declined    Lack of Transportation (Non-Medical): Patient declined  Physical Activity: Unknown (02/20/2019)   Exercise Vital Sign    Days of Exercise per Week: Patient declined    Minutes of Exercise per Session: Patient declined  Stress: Stress Concern Present (02/20/2019)   [employer] [place]    Feeling of Stress : Rather much  Social Connections: Moderately Isolated (02/20/2019)   Social Connection and Isolation Panel [NHANES]    Frequency of Communication with Friends and Family: More than three times a week    Frequency of Social Gatherings with Friends and Family: Twice a week    Attends Religious Services: 1 to 4 times per year    Active Member of [employer] or Organizations: No    Attends Archivist Meetings: Never    Marital Status: Widowed     Review of Systems   Gen: Denies fever, chills, anorexia. Denies fatigue, weakness, weight loss.  CV: Denies chest pain, palpitations, syncope, peripheral edema, and claudication. Resp: Denies dyspnea at rest, cough, wheezing, coughing up blood, and pleurisy. GI: See HPI Derm: Denies rash, itching, dry skin Psych: Denies depression, anxiety, memory loss, confusion. No homicidal or suicidal ideation.  Heme: Denies bruising, bleeding, and enlarged lymph  nodes.   Physical Exam   BP 136/69 (BP Location: Left Arm, Patient Position: Sitting, Cuff Size: Large)   Pulse (!) 55   Temp (!) 97.3 F (36.3 C) (Temporal)   Ht 5\' 4"  (1.626 m)   Wt 142 lb (64.4 kg)   BMI 24.37 kg/m   General:   Alert and oriented. No distress noted. Pleasant and cooperative.  Head:  Normocephalic and atraumatic. Eyes:  Conjuctiva clear without scleral icterus. Mouth:  Oral mucosa pink and moist. Good dentition. No lesions. Lungs:  Clear to auscultation bilaterally. No wheezes, rales, or rhonchi. No distress.  Heart: Mild bradycardia, mild systolic murmur present. Abdomen:  +BS, soft, non-tender and non-distended. No rebound or guarding. No HSM or masses noted. Rectal: deferred Msk:  Symmetrical without gross deformities. Normal posture. Extremities:  Without edema. Neurologic:  Alert and  oriented x4 Psych:  Alert and cooperative. Normal mood and affect.   Assessment  Amber Herman is a 77 y.o. female with a history of  Afib on Eliquis, HTN, NSTEMI in 2020, diabetes, hypothyroidism, TIA, anxiety, and anemia with baseline hgb in 10 range presenting today for follow up.   Normocytic anemia: Baseline hemoglobin in the 10 range.  Had drop late last year to hemoglobin of 9.5.  She had some melena and fatigue in May of last year where she underwent CTA for GI bleed scan which revealed no bleeding but had question of diverticulitis.  She had EGD at that time with a few bleeding gastric polyps.  Recently updated colonoscopy 06/02/2022 with pancolonic diverticulosis and 2 polyps in the colon revealing to be sessile serrated adenomas.  No recommended repeat colonoscopy due to age.  Her most recent hemoglobin was performed 07/23/2022 and was stable at  10.4 with normocytic indices.  She has been having some dark stools but she has maintained on oral iron.  She denies any overt melena and also denies any BRBPR.  She still is experiencing some fatigue as well as feelings of  coldness but she does have a good appetite and weight is stable.  Advised we will continue once daily oral iron and recheck CBC in 6 months.  She will notify for any worsening symptoms and advised her to continue to monitor for any melena or BRBPR.   Constipation: She has noted improvement in bowel function since having her colonoscopy.  Currently using MiraLAX as needed as well as a daily probiotic.  Not having any frequent looser stools.  She is doing a great job of being active as she typically exercises at the senior center almost daily usually completes 1-3 miles.  She is likely lacking in water intake as she primarily drinks sodas versus water.  I encouraged her to increase her water intake and decrease soda intake.  She will continue with current regimen of MiraLAX as needed and probiotic daily.  Nausea: She has been experiencing some nausea recently likely due to motion sickness.  Also possibly related to some dizziness related to blood pressure medication.  Encouraged her to follow-up with her PCP regarding her blood pressure regimen as well as her dizziness.  She states that prior to starting this blood pressure medication she did not have any dizziness.  For now I will give a short course of Zofran for her to use as needed.  Encouraged her for the first couple times that she takes it do not take while driving to ensure she does not have any drowsiness.   PLAN   Continue iron once daily. Zofran 4 mg ODT to use as needed for nausea.  Continue miralax daily as needed and probiotic. Increase water intake CBC in 6 months Monitor for rectal bleeding and melena Follow up in 6 months.     Venetia Night, MSN, FNP-BC, AGACNP-BC [facility]

## 2022-08-18 ENCOUNTER — Encounter: Payer: Self-pay | Admitting: Gastroenterology

## 2022-08-18 ENCOUNTER — Ambulatory Visit (INDEPENDENT_AMBULATORY_CARE_PROVIDER_SITE_OTHER): Payer: Medicare Other | Admitting: Gastroenterology

## 2022-08-18 VITALS — BP 136/69 | HR 55 | Temp 97.3°F | Ht 64.0 in | Wt 142.0 lb

## 2022-08-18 DIAGNOSIS — R110 Nausea: Secondary | ICD-10-CM | POA: Diagnosis not present

## 2022-08-18 DIAGNOSIS — K5900 Constipation, unspecified: Secondary | ICD-10-CM

## 2022-08-18 DIAGNOSIS — D649 Anemia, unspecified: Secondary | ICD-10-CM | POA: Diagnosis not present

## 2022-08-18 MED ORDER — ONDANSETRON 4 MG PO TBDP: 4.0000 mg | ORAL_TABLET | Freq: Three times a day (TID) | ORAL | 0 refills | Status: AC | PRN

## 2022-08-18 NOTE — Patient Instructions (Addendum)
I have sent in Zofran to take 4 mg under the tongue as needed for nausea related to motion sickness.  As we discussed please mention this to your primary care to discuss any alternative medication for vertigo and motion sickness.  Continue taking MiraLAX as needed as well as your probiotic.   He would likely benefit from substituting water for couple of your sodas daily to ensure better hydration.  This can help with your constipation.  Continue taking your iron once daily.  We will plan to recheck your hemoglobin in 6 months.  Continue to monitor for black/tarry stools and any rectal bleeding.  Please reach out to the office if this occurs.  We will see you in 6 months, sooner if needed.  It was a pleasure to see you today. I want to create trusting relationships with patients. If you receive a survey regarding your visit,  I greatly appreciate you taking time to fill this out on paper or through your MyChart. I value your feedback.  Venetia Night, MSN, FNP-BC, AGACNP-BC [facility]

## 2023-01-15 ENCOUNTER — Other Ambulatory Visit (INDEPENDENT_AMBULATORY_CARE_PROVIDER_SITE_OTHER): Payer: Self-pay

## 2023-01-15 DIAGNOSIS — D649 Anemia, unspecified: Secondary | ICD-10-CM

## 2023-01-15 DIAGNOSIS — R1319 Other dysphagia: Secondary | ICD-10-CM

## 2023-01-15 DIAGNOSIS — K625 Hemorrhage of anus and rectum: Secondary | ICD-10-CM

## 2023-01-15 DIAGNOSIS — K922 Gastrointestinal hemorrhage, unspecified: Secondary | ICD-10-CM

## 2023-01-15 DIAGNOSIS — K921 Melena: Secondary | ICD-10-CM

## 2023-02-22 ENCOUNTER — Ambulatory Visit (INDEPENDENT_AMBULATORY_CARE_PROVIDER_SITE_OTHER): Payer: Medicare Other | Admitting: Gastroenterology

## 2023-02-22 ENCOUNTER — Encounter: Payer: Self-pay | Admitting: Gastroenterology

## 2023-02-22 VITALS — BP 139/68 | HR 60 | Temp 97.7°F | Ht 63.0 in | Wt 138.4 lb

## 2023-02-22 DIAGNOSIS — K5904 Chronic idiopathic constipation: Secondary | ICD-10-CM | POA: Diagnosis not present

## 2023-02-22 DIAGNOSIS — D649 Anemia, unspecified: Secondary | ICD-10-CM | POA: Diagnosis not present

## 2023-02-22 MED ORDER — LUBIPROSTONE 8 MCG PO CAPS: 8.0000 ug | ORAL_CAPSULE | Freq: Every day | ORAL | 1 refills | Status: DC

## 2023-02-22 NOTE — Progress Notes (Addendum)
GI Office Note    Referring Provider: Erasmo Downer, NP Primary Care Physician:  Erasmo Downer, NP Primary Gastroenterologist: Hennie Duos. Marletta Lor, DO  Date:  02/22/2023  ID:  Amber, Herman [birth year], MRN [id number]   Chief Complaint   Chief Complaint  Patient presents with   Follow-up    Follow up. No problems    History of Present Illness  Amber Herman is a 77 y.o. female with a history of A-fib on Eliquis, HTN, NSTEMI in 2020, diabetes, hypothyroidism, TIA, anxiety, and anemia (baseline hgb 10) presenting today for follow-up.  Prior hospitalization in May 2023 for anemia and dark stools.  Hemoglobin stable at 10.9, stool heme-negative.  CTA GI bleed showed questionable area of sigmoid diverticulitis.  She underwent EGD which showed multiple gastric polyps with stigmata of recent bleeding, polyps removed with hot snare and 3 clips placed at 2 different sites.  Pathology revealed hyperplastic gastric polyps, gastric biopsy with mild hyperemia but no H. pylori.  She was encouraged to have outpatient colonoscopy 6 to 8 weeks after discharge.   Labs 03/25/22: Hemoglobin 9.5, MCV 93, platelets 336.   Colonoscopy 06/02/22: -pan colonic diverticulosis -2mm polyp in cecum -6mm ascending colon polyp -polyps sessile serrated adenomas.  -no repeat colonoscopy due to age.   Last office visit 08/18/22. Having daily bowel movement, time sometimes more.  Suffering from incomplete emptying and not having any loose stools.  Taking MiraLAX as needed with daily probiotic.  Admits to not drinking enough water.  Having issues with motion sickness and morning nausea.  Was waking up dizzy in the mornings after taking prazosin.  Had some improvement after taking only 1 dose of medication but was not having any dizziness or nausea prior to this.  Symptoms were suspected be secondary to medication and encouraged her to discuss this with PCP.  Taking iron once daily having some darker  stools.  Denied any issues with hemorrhoids.  Continue to have fatigue but it has been about the same as her baseline.  Exercising daily.  Labs 01/21/2023: Hgb 9.6, platelets 376, sodium 133, potassium 5.2, creatinine 1.8.  Normal LFTs.  Lipid panel within normal limits.  TSH 30.19, T40.61  Today: Constipation - Has not been taking miralax. Had BM today but has to sit there for a while and does have to strain some. Stools sometimes hard and sometimes they are softer ([place] 6 and bristol 3). Tries to go everyday but struggles. Has been having some lower abdominal pains as well and gets better after a BM.  Also having some bloating. Not taking a probiotic.   Still taking iron daily. [facility] Does a lot of walking - sometimes 1-2 miles.   Still drinking more drinks and not as much water. Bought drink mix ins but has not been using them much at all.   Does take tramadol as needed for back pain.   Deneis any acid reflux, N/V, dysphagia.    Current Outpatient Medications  Medication Sig Dispense Refill   acetaminophen (TYLENOL) 325 MG tablet Take 2 tablets (650 mg total) by mouth every 8 (eight) hours as needed. 60 tablet 0   amiodarone (PACERONE) 200 MG tablet TAKE 1 TABLET BY MOUTH ONCE DAILY. 30 tablet 6   amLODipine (NORVASC) 5 MG tablet Take 5 mg by mouth 2 (two) times daily.     apixaban (ELIQUIS) 5 MG TABS tablet Take 1 tablet (5 mg total)  by mouth 2 (two) times daily. 60 tablet 1   eplerenone (INSPRA) 25 MG tablet Take 25 mg by mouth daily.     ergocalciferol (VITAMIN D2) 1.25 MG (50000 UT) capsule Take 50,000 Units by mouth once a week.     FEROSUL 325 (65 Fe) MG tablet Take 325 mg by mouth daily.      levothyroxine (SYNTHROID) 100 MCG tablet Take 100 mcg by mouth daily.     losartan (COZAAR) 50 MG tablet Take 1 tablet (50 mg total) by mouth 2 (two) times daily. 180 tablet 3   magnesium oxide (MAG-OX) 400 (241.3 Mg) MG tablet TAKE 1 TABLET  BY MOUTH ONCE DAILY. 28 tablet 11   metFORMIN (GLUCOPHAGE) 850 MG tablet Take 850 mg by mouth 2 (two) times daily with a meal.      metoprolol succinate (TOPROL-XL) 50 MG 24 hr tablet Take 50 mg by mouth daily.     ondansetron (ZOFRAN-ODT) 4 MG disintegrating tablet Take 1 tablet (4 mg total) by mouth every 8 (eight) hours as needed for nausea or vomiting. 10 tablet 0   pantoprazole (PROTONIX) 40 MG tablet Take 1 tablet (40 mg total) by mouth 2 (two) times daily before a meal. 60 tablet 2   pravastatin (PRAVACHOL) 40 MG tablet Take 40 mg by mouth at bedtime.      prazosin (MINIPRESS) 2 MG capsule Take 4 mg by mouth at bedtime.     TRESIBA FLEXTOUCH 200 UNIT/ML SOPN Inject 0-20 Units into the skin at bedtime.     vitamin B-12 (CYANOCOBALAMIN) 1000 MCG tablet Take 1,000 mcg by mouth daily.      polyethylene glycol powder (GLYCOLAX/MIRALAX) 17 GM/SCOOP powder Take one capful daily until soft stool, then continue once daily as needed to maintain soft stool. (Patient not taking: Reported on 02/22/2023) 527 g 3   No current facility-administered medications for this visit.    Past Medical History:  Diagnosis Date   Allergic rhinitis    Anemia    Atrial fibrillation (HCC)    Diagnosed 2013   DM (diabetes mellitus), type 2 (HCC)    GERD (gastroesophageal reflux disease)    Hyperlipidemia    Hypertension    Hypothyroidism    Nephrolithiasis    Rotator cuff tear    Thyroid disease    Transient ischemic attack (TIA)    Vitamin D deficiency     Past Surgical History:  Procedure Laterality Date   BIOPSY  10/05/2021   Procedure: BIOPSY;  Surgeon: Kathi Der, MD;  Location: [facility];  Service: Gastroenterology;;   BIOPSY  06/02/2022   Procedure: BIOPSY;  Surgeon: Lanelle Bal, DO;  Location: [facility];  Service: Endoscopy;;   CARDIOVERSION N/A 02/22/2019   Procedure: CARDIOVERSION;  Surgeon: Little Ishikawa, MD;  Location: [facility] ENDOSCOPY;  Service: Endoscopy;  Laterality:  N/A;   CHOLECYSTECTOMY     COLONOSCOPY     normal per patient, more than 10 years ago   COLONOSCOPY WITH PROPOFOL N/A 06/02/2022   Procedure: COLONOSCOPY WITH PROPOFOL;  Surgeon: Lanelle Bal, DO;  Location: [facility];  Service: Endoscopy;  Laterality: N/A;  7:30 am   ESOPHAGOGASTRODUODENOSCOPY (EGD) WITH PROPOFOL N/A 10/05/2021   Procedure: ESOPHAGOGASTRODUODENOSCOPY (EGD) WITH PROPOFOL;  Surgeon: Kathi Der, MD;  Location: [facility];  Service: Gastroenterology;  Laterality: N/A;   HEMOSTASIS CLIP PLACEMENT  10/05/2021   Procedure: HEMOSTASIS CLIP PLACEMENT;  Surgeon: Kathi Der, MD;  Location: [facility];  Service: Gastroenterology;;   LEFT HEART  CATH AND CORONARY ANGIOGRAPHY N/A 02/21/2019   Procedure: LEFT HEART CATH AND CORONARY ANGIOGRAPHY;  Surgeon: Tonny Bollman, MD;  Location: [facility] INVASIVE CV LAB;  Service: Cardiovascular;  Laterality: N/A;   POLYPECTOMY  10/05/2021   Procedure: POLYPECTOMY;  Surgeon: Kathi Der, MD;  Location: [facility];  Service: Gastroenterology;;   POLYPECTOMY  06/02/2022   Procedure: POLYPECTOMY;  Surgeon: Lanelle Bal, DO;  Location: [facility];  Service: Endoscopy;;   TEE WITHOUT CARDIOVERSION N/A 02/22/2019   Procedure: TRANSESOPHAGEAL ECHOCARDIOGRAM (TEE);  Surgeon: Little Ishikawa, MD;  Location: [facility] ENDOSCOPY;  Service: Endoscopy;  Laterality: N/A;    Family History  Problem Relation Age of Onset   Heart disease Mother    Alzheimer's disease Mother    Diabetes Mother    Skin cancer Mother    Hypertension Father    Diabetes Father    Heart disease Brother    Breast cancer Daughter    Colon cancer Neg Hx     Allergies as of 02/22/2023 - Review Complete 02/22/2023  Allergen Reaction Noted   Angiotensin receptor blockers  11/25/2011   Calcium channel blockers  11/25/2011   Elemental sulfur Itching 07/24/2021   Latex Hives and Itching 02/21/2019   Minoxidil Nausea Only 04/17/2021   Nicardipine hcl   09/18/2014   Simvastatin  11/25/2011   Clonidine Other (See Comments) 08/18/2019   Sulfa antibiotics Itching and Rash 11/25/2011   Sulfacetamide sodium Itching and Rash 09/18/2014   Sulfasalazine Itching and Rash 11/25/2011    Social History   Socioeconomic History   Marital status: Widowed    Spouse name: Not on file   Number of children: Not on file   Years of education: Not on file   Highest education level: Not on file  Occupational History   Not on file  Tobacco Use   Smoking status: Never   Smokeless tobacco: Never  Vaping Use   Vaping status: Never Used  Substance and Sexual Activity   Alcohol use: No   Drug use: No   Sexual activity: Not Currently  Other Topics Concern   Not on file  Social History Narrative   Not on file   Social Determinants of Health   Financial Resource Strain: Low Risk  (04/01/2020)   Received [facility], [facility]   Overall Financial Resource Strain (CARDIA)    Difficulty of Paying Living Expenses: Not very hard  Food Insecurity: No Food Insecurity (04/01/2020)   Received from [facility], [facility]   Hunger Vital Sign    Worried About Running Out of Food in the Last Year: Never true    Ran Out of Food in the Last Year: Never true  Transportation Needs: No Transportation Needs (03/17/2021)   Received from [facility], [facility]   [facility] - Transportation    In the past 12 months, has lack of transportation kept you from medical appointments or from getting medications?: No    Lack of Transportation (Non-Medical): No  Physical Activity: Unknown (02/20/2019)   Exercise Vital Sign    Days of Exercise per Week: Patient declined    Minutes of Exercise per Session: Patient declined  Stress: Stress Concern Present (02/20/2019)   [employer] [facility] [employer] - Occupational Stress Questionnaire    Feeling of Stress : Rather much  Social Connections: Unknown (10/03/2021)    Received from [facility], [facility]   Social Network    Social Network: Not  on file     Review of Systems   Gen: Denies fever, chills, anorexia. Denies fatigue, weakness, weight loss.  CV: Denies chest pain, palpitations, syncope, peripheral edema, and claudication. Resp: Denies dyspnea at rest, cough, wheezing, coughing up blood, and pleurisy. GI: See HPI Derm: Denies rash, itching, dry skin Psych: Denies depression, anxiety, memory loss, confusion. No homicidal or suicidal ideation.  Heme: Denies bruising, bleeding, and enlarged lymph nodes.   Physical Exam   BP 139/68 (BP Location: Right Arm, Patient Position: Sitting, Cuff Size: Normal)   Pulse 60   Temp 97.7 F (36.5 C) (Temporal)   Ht 5\' 3"  (1.6 m)   Wt 138 lb 6.4 oz (62.8 kg)   SpO2 99%   BMI 24.52 kg/m   General:   Alert and oriented. No distress noted. Pleasant and cooperative.  Head:  Normocephalic and atraumatic. Eyes:  Conjuctiva clear without scleral icterus. Mouth:  Oral mucosa pink and moist. Good dentition. No lesions. Lungs:  Clear to auscultation bilaterally. No wheezes, rales, or rhonchi. No distress.  Heart:  S1, S2 present without murmurs appreciated.  Abdomen:  +BS, soft, non-tender and non-distended. No rebound or guarding. No HSM or masses noted. Rectal: deferred Msk:  Symmetrical without gross deformities. Normal posture. Extremities:  Without edema. Neurologic:  Alert and  oriented x4 Psych:  Alert and cooperative. Normal mood and affect.   Assessment  Amber Herman is a 77 y.o. female with a history of A-fib on Eliquis, HTN, NSTEMI in 2020, diabetes, hypothyroidism, TIA, anxiety, and anemia (baseline hgb 10) presenting today for follow-up.  Normocytic anemia: Baseline hemoglobin in the 10 range.  Last year had a drop to 9.5 and was having some melena and fatigue.  Prior CTA GI bleed scan was negative but with question of diverticulitis.  EGD with few bleeding gastric polyps which  likely contributed.  Colonoscopy up-to-date with pancolonic diverticulosis and 2 polyps removed, otherwise no etiology for anemia.  In February she had hemoglobin within her normal range.  Recent hemoglobin in August with slight drop to 9.6 but currently not having any melena or BRBPR.  Fatigue remains about the same.  Advised her that we should consider capsule endoscopy in future if further drops in hgb.  Advised continue to monitor for any signs of GI bleeding and she will continue on daily iron therapy.  Constipation: Likely exacerbated by intermittent opioid use as well as daily iron intake. Likely has combination of chronic idiopathic constipation along with intermittent opioid-induced constipation.  Since last visit she has stopped taking MiraLAX and has been struggling again with constipation.  Having daily stools for the most part but having to strain and sit for long periods of time and is usually small amount.  Encouraged resumption of MiraLAX however she would like to consider prescription medication and therefore we will trial Amitiza once daily and we will do an alternative as needed.  Although she is very active if she is lacking in water intake therefore I again encouraged this as well as an increase in fiber in her diet.  PLAN   Amitiza 8 mcg daily  Continue miralax 17g daily in coffee or water until able to start Amitiza.  Increase fiber in diet Increase water intake.  Continue daily iron Reassess labs at next follow-up.  Consider capsule endoscopy if  further drop of hemoglobin in the future. Follow up in 4 months    Brooke Bonito, MSN, FNP-BC, AGACNP-BC [facility]

## 2023-02-22 NOTE — Patient Instructions (Addendum)
I will send in Amitiza for you to take 8 mcg (1 tablet) daily to help with your constipation.  If there is any issues with insurance coverage we will send an alternative that is covered.  You should take this with food to avoid nausea.  In the meantime I want you to start taking MiraLAX 17 g once daily again in 8 ounces of water, coffee, or juice daily.  Please increase your water intake.  You can do a Gatorade mix in or lemonade/other water flavoring as needed as well in order to increase your intake.  Follow a high-fiber diet.  Handout attached for you.  We will plan to follow-up in 4 months, sooner if needed.  Please let me know if you have any issues with Amitiza or MiraLAX.  It was a pleasure to see you today. I want to create trusting relationships with patients. If you receive a survey regarding your visit,  I greatly appreciate you taking time to fill this out on paper or through your MyChart. I value your feedback.  Brooke Bonito, MSN, FNP-BC, AGACNP-BC [facility]

## 2023-05-21 ENCOUNTER — Other Ambulatory Visit (HOSPITAL_COMMUNITY): Payer: Self-pay | Admitting: Nephrology

## 2023-05-21 DIAGNOSIS — E118 Type 2 diabetes mellitus with unspecified complications: Secondary | ICD-10-CM

## 2023-05-21 DIAGNOSIS — N189 Chronic kidney disease, unspecified: Secondary | ICD-10-CM

## 2023-05-21 DIAGNOSIS — N1832 Chronic kidney disease, stage 3b: Secondary | ICD-10-CM

## 2023-06-09 NOTE — Progress Notes (Signed)
 GI Office Note    Referring Provider: [name], NP Primary Care Physician:  [name], NP Primary Gastroenterologist: Carlin POUR. Cindie, DO  Date:  06/10/2023  ID:  Amber Herman, DOB [birth year], MRN [id number]  Chief Complaint   Chief Complaint  Patient presents with   Follow-up    Needs refills   History of Present Illness  Amber Herman is a 78 y.o. female with a history of -fib on Eliquis , HTN, NSTEMI in 2020, diabetes, hypothyroidism, TIA, anxiety, and anemia (baseline hgb 10) presenting today for medication refill.   Prior hospitalization in May 2023 for anemia and dark stools.  Hemoglobin stable at 10.9, stool heme-negative.  CTA GI bleed showed questionable area of sigmoid diverticulitis.  She underwent EGD which showed multiple gastric polyps with stigmata of recent bleeding, polyps removed with hot snare and 3 clips placed at 2 different sites.  Pathology revealed hyperplastic gastric polyps, gastric biopsy with mild hyperemia but no H. pylori.  She was encouraged to have outpatient colonoscopy 6 to 8 weeks after discharge.   Labs 03/25/22: Hemoglobin 9.5, MCV 93, platelets 336.   Colonoscopy 06/02/22: -pan colonic diverticulosis -2mm polyp in cecum -6mm ascending colon polyp -polyps sessile serrated adenomas.  -no repeat colonoscopy due to age.    Last office visit 08/18/22. Having daily bowel movement, time sometimes more.  Suffering from incomplete emptying and not having any loose stools.  Taking MiraLAX  as needed with daily probiotic.  Admits to not drinking enough water.  Having issues with motion sickness and morning nausea.  Was waking up dizzy in the mornings after taking prazosin .  Had some improvement after taking only 1 dose of medication but was not having any dizziness or nausea prior to this.  Symptoms were suspected be secondary to medication and encouraged her to discuss this with PCP.  Taking iron once daily having some darker stools.   Denied any issues with hemorrhoids.  Continue to have fatigue but it has been about the same as her baseline. Exercising daily.  Last office visit 02/22/23.  Not taking MiraLAX .  Having to strain with bowel movements.  Sometimes stools hard and sometimes softer.  Tries to go daily but continues to struggle.  Also having lower abdominal pain which does improve after bowel movements.  Also with some mild bloating and not taking a probiotic.  Does take iron daily and [facility].  Takes tramadol  as needed for back pain.  Denied any upper GI symptoms. Advised Amitiza  8 mcg daily.  Continue MiraLAX  daily in coffee or water until she was able to start Amitiza .  Increase fiber and water intake daily.  Continue daily iron supplementation.  Repeat labs at next follow-up and consider capsule endoscopy if further drop in hemoglobin.     Latest Ref Rng & Units 05/08/2022    2:10 PM 03/25/2022   10:59 AM 10/14/2021    4:38 PM  CBC  WBC 3.4 - 10.8 x10E3/uL 16.3  8.7  11.2   Hemoglobin 11.1 - 15.9 g/dL 9.8  9.5  89.1   Hematocrit 34.0 - 46.6 % 30.1  28.1  32.6   Platelets 150 - 450 x10E3/uL 409  336  387        Latest Ref Rng & Units 05/28/2022   10:29 AM 10/14/2021    4:38 PM 10/04/2021    6:16 AM  CMP  Glucose 70 - 99 mg/dL 895  95  895   BUN  8 - 23 mg/dL 24  21  16    Creatinine 0.44 - 1.00 mg/dL 8.34  8.84  9.09   Sodium 135 - 145 mmol/L 132  132  137   Potassium 3.5 - 5.1 mmol/L 4.5  4.7  3.8   Chloride 98 - 111 mmol/L 98  96  102   CO2 22 - 32 mmol/L 25  24  26    Calcium 8.9 - 10.3 mg/dL 8.8  9.5  8.7   Total Protein 6.5 - 8.1 g/dL  7.4  6.7   Total Bilirubin 0.3 - 1.2 mg/dL  0.4  0.5   Alkaline Phos 38 - 126 U/L  60  52   AST 15 - 41 U/L  21  14   ALT 0 - 44 U/L  18  14     Reviewed [facility] database: Labs August 2024 - hemoglobin 9.6, Potassium 5.2, sodium 133, cr 1.8. normal LFTs. TSH 30 and T4 0.61  Today:  Doing well on the Amitiza . Not taking miralax  or colace.  Has a good daily BM, sometimes twice per day. Does not spend long time on the commode. Still goes to the senior center 3-5 day a week. Does not drink much water but has bene drinking sodas throughout the day (dt. Mt dew, dt coke, etc). May drink 2 of these a day - may drink a bottle or can depending on what is on sale. If she drinks tea then she does unsweetened with lemon.   She does admit to feeling cold more.   Denies chest pain, sob, lightheadedness, dizziness, falls. She has blood work due with Dr. Rachele.    Wt Readings from Last 3 Encounters:  06/10/23 140 lb (63.5 kg)  02/22/23 138 lb 6.4 oz (62.8 kg)  08/18/22 142 lb (64.4 kg)    Current Outpatient Medications  Medication Sig Dispense Refill   acetaminophen  (TYLENOL ) 325 MG tablet Take 2 tablets (650 mg total) by mouth every 8 (eight) hours as needed. 60 tablet 0   amiodarone  (PACERONE ) 200 MG tablet TAKE 1 TABLET BY MOUTH ONCE DAILY. 30 tablet 6   amLODipine  (NORVASC ) 5 MG tablet Take 5 mg by mouth 2 (two) times daily.     apixaban  (ELIQUIS ) 5 MG TABS tablet Take 1 tablet (5 mg total) by mouth 2 (two) times daily. 60 tablet 1   eplerenone (INSPRA) 25 MG tablet Take 25 mg by mouth daily.     ergocalciferol  (VITAMIN D2) 1.25 MG (50000 UT) capsule Take 50,000 Units by mouth once a week.     FEROSUL 325 (65 Fe) MG tablet Take 325 mg by mouth daily.      levothyroxine  (SYNTHROID ) 100 MCG tablet Take 100 mcg by mouth daily.     losartan  (COZAAR ) 50 MG tablet Take 1 tablet (50 mg total) by mouth 2 (two) times daily. 180 tablet 3   lubiprostone  (AMITIZA ) 8 MCG capsule Take 1 capsule (8 mcg total) by mouth daily with breakfast. 60 capsule 1   magnesium  oxide (MAG-OX) 400 (241.3 Mg) MG tablet TAKE 1 TABLET BY MOUTH ONCE DAILY. 28 tablet 11   metFORMIN  (GLUCOPHAGE ) 850 MG tablet Take 850 mg by mouth 2 (two) times daily with a meal.      metoprolol  succinate (TOPROL -XL) 50 MG 24 hr tablet Take 50 mg by mouth daily.     ondansetron   (ZOFRAN -ODT) 4 MG disintegrating tablet Take 1 tablet (4 mg total) by mouth every 8 (eight) hours as needed for nausea or vomiting.  10 tablet 0   pantoprazole  (PROTONIX ) 40 MG tablet Take 1 tablet (40 mg total) by mouth 2 (two) times daily before a meal. 60 tablet 2   polyethylene glycol powder (GLYCOLAX /MIRALAX ) 17 GM/SCOOP powder Take one capful daily until soft stool, then continue once daily as needed to maintain soft stool. 527 g 3   pravastatin  (PRAVACHOL ) 40 MG tablet Take 40 mg by mouth at bedtime.      traMADol  (ULTRAM ) 50 MG tablet Take 50 mg by mouth every 6 (six) hours as needed for severe pain or moderate pain.     TRESIBA  FLEXTOUCH 200 UNIT/ML SOPN Inject 0-20 Units into the skin at bedtime.     vitamin B-12 (CYANOCOBALAMIN ) 1000 MCG tablet Take 1,000 mcg by mouth daily.      No current facility-administered medications for this visit.    Past Medical History:  Diagnosis Date   Allergic rhinitis    Anemia    Atrial fibrillation (HCC)    Diagnosed 2013   DM (diabetes mellitus), type 2 (HCC)    GERD (gastroesophageal reflux disease)    Hyperlipidemia    Hypertension    Hypothyroidism    Nephrolithiasis    Rotator cuff tear    Thyroid  disease    Transient ischemic attack (TIA)    Vitamin D  deficiency     Past Surgical History:  Procedure Laterality Date   BIOPSY  10/05/2021   Procedure: BIOPSY;  Surgeon: Elicia Claw, MD;  Location: [facility];  Service: Gastroenterology;;   BIOPSY  06/02/2022   Procedure: BIOPSY;  Surgeon: Cindie Carlin POUR, DO;  Location: [facility];  Service: Endoscopy;;   CARDIOVERSION N/A 02/22/2019   Procedure: CARDIOVERSION;  Surgeon: Kate Lonni CROME, MD;  Location: [facility] ENDOSCOPY;  Service: Endoscopy;  Laterality: N/A;   CHOLECYSTECTOMY     COLONOSCOPY     normal per patient, more than 10 years ago   COLONOSCOPY WITH PROPOFOL  N/A 06/02/2022   Procedure: COLONOSCOPY WITH PROPOFOL ;  Surgeon: Cindie Carlin POUR, DO;  Location: [facility];  Service: Endoscopy;  Laterality: N/A;  7:30 am   ESOPHAGOGASTRODUODENOSCOPY (EGD) WITH PROPOFOL  N/A 10/05/2021   Procedure: ESOPHAGOGASTRODUODENOSCOPY (EGD) WITH PROPOFOL ;  Surgeon: Elicia Claw, MD;  Location: [facility] ENDOS[facility];  Service: Gastroenterology;  Laterality: N/A;   HEMOSTASIS CLIP PLACEMENT  10/05/2021   Procedure: HEMOSTASIS CLIP PLACEMENT;  Surgeon: Elicia Claw, MD;  Location: [facility]OS[facility];  Service: Gastroenterology;;   LEFT HEART CATH AND CORONARY ANGIOGRAPHY N/A 02/21/2019   Procedure: LEFT HEART CATH AND CORONARY ANGIOGRAPHY;  Surgeon: Wonda Sharper, MD;  Location: [facility] INVASIVE CV LAB;  Service: Cardiovascular;  Laterality: N/A;   POLYPECTOMY  10/05/2021   Procedure: POLYPECTOMY;  Surgeon: Elicia Claw, MD;  Location: [facility] ENDOSCOPY;  Service: Gastroenterology;;   POLYPECTOMY  06/02/2022   Procedure: POLYPECTOMY;  Surgeon: Cindie Carlin POUR, DO;  Location: [facility];  Service: Endoscopy;;   TEE WITHOUT CARDIOVERSION N/A 02/22/2019   Procedure: TRANSESOPHAGEAL ECHOCARDIOGRAM (TEE);  Surgeon: Kate Lonni CROME, MD;  Location: [facility] ENDOSCOPY;  Service: Endoscopy;  Laterality: N/A;    Family History  Problem Relation Age of Onset   Heart disease Mother    Alzheimer's disease Mother    Diabetes Mother    Skin cancer Mother    Hypertension Father    Diabetes Father    Heart disease Brother    Breast cancer Daughter    Colon cancer Neg Hx     Allergies as of 06/10/2023 - Review Complete 06/10/2023  Allergen  Reaction Noted   Angiotensin receptor blockers  11/25/2011   Calcium channel blockers  11/25/2011   Elemental sulfur Itching 07/24/2021   Latex Hives and Itching 02/21/2019   Minoxidil Nausea Only 04/17/2021   Nicardipine hcl  09/18/2014   Simvastatin  11/25/2011   Clonidine  Other (See Comments) 08/18/2019   Sulfa antibiotics Itching and Rash 11/25/2011   Sulfacetamide sodium Itching and Rash 09/18/2014   Sulfasalazine Itching and Rash 11/25/2011     Social History   Socioeconomic History   Marital status: Widowed    Spouse name: Not on file   Number of children: Not on file   Years of education: Not on file   Highest education level: Not on file  Occupational History   Not on file  Tobacco Use   Smoking status: Never   Smokeless tobacco: Never  Vaping Use   Vaping status: Never Used  Substance and Sexual Activity   Alcohol  use: No   Drug use: No   Sexual activity: Not Currently  Other Topics Concern   Not on file  Social History Narrative   Not on file   Social Drivers of Health   Financial Resource Strain: Low Risk  (04/01/2020)   Received [facility]   Overall Financial Resource Strain (CARDIA)    Difficulty of Paying Living Expenses: Not very hard  Food Insecurity: No Food Insecurity (04/01/2020)   Received from [facility], [facility]   Hunger Vital Sign    Worried About Running Out of Food in the Last Year: Never true    Ran Out of Food in the Last Year: Never true  Transportation Needs: No Transportation Needs (03/17/2021)   Received from [facility], [facility]   [facility] - Transportation    In the past 12 months, has lack of transportation kept you from medical appointments or from getting medications?: No    Lack of Transportation (Non-Medical): No  Physical Activity: Unknown (02/20/2019)   Exercise Vital Sign    Days of Exercise per Week: Patient declined    Minutes of Exercise per Session: Patient declined  Stress: Stress Concern Present (02/20/2019)   [employer] - Occupational Stress Questionnaire    Feeling of Stress : Rather much  Social Connections: Unknown (10/03/2021)   Received from [facility], [facility]   Social Network    Social Network: Not on file     Review of Systems   Gen: Denies fever, chills, anorexia. Denies fatigue, weakness, weight loss.  CV: Denies chest pain,  palpitations, syncope, peripheral edema, and claudication. Resp: Denies dyspnea at rest, cough, wheezing, coughing up blood, and pleurisy. GI: See HPI Derm: Denies rash, itching, dry skin Psych: Denies depression, anxiety, memory loss, confusion. No homicidal or suicidal ideation.  Heme: Denies bruising, bleeding, and enlarged lymph nodes.  Physical Exam   BP 139/62 (BP Location: Right Arm, Patient Position: Sitting, Cuff Size: Normal)   Pulse (!) 59   Temp 98.7 F (37.1 C) (Oral)   Resp (!) 98   Ht 5' 4 (1.626 m)   Wt 140 lb (63.5 kg)   BMI 24.03 kg/m   General:   Alert and oriented. No distress noted. Pleasant and cooperative.  Head:  Normocephalic and atraumatic. Eyes:  Conjuctiva clear without scleral icterus. Mouth:  Oral mucosa pink and moist. Good dentition. No lesions. Abdomen:  +BS, soft, non-tender and non-distended. No rebound or guarding. No HSM or masses  noted. Rectal: deferred Msk:  Symmetrical without gross deformities. Normal posture. Extremities:  Without edema. Neurologic:  Alert and oriented x4 Psych:  Alert and cooperative. Normal mood and affect.  Assessment  Amber Herman is a 78 y.o. female with a history of A-fib on Eliquis , HTN, NSTEMI in 2020, diabetes, hypothyroidism, TIA, anxiety, and anemia (baseline hgb 10) presenting today for follow up.  Constipation: Doing fairly well with Amitiza  8 mcg a few times per week.  Has 1-2 bowel movements daily without the need to strain.  Denies any incomplete emptying.  Does have some increased gassiness/flatulence however no abdominal pain.  Requested refill of medication today.  Will continue Amitiza  few times weekly and increase to daily if needed.  For gassiness I recommended Gas-X, Phazyme, or Beano.  I also advised her to avoid higher gas producing foods and reduce her intake of carbonated beverages.  Normocytic anemia: Her baseline hemoglobin has been within the 10 range.  In 2023 she had a drop to 9.5 and in  late 2023 she had a hemoglobin of 9.8.  Per review of her chart she had a recent hemoglobin in August of this year in which her hemoglobin was stable at 9.6.  She denies any melena, BRBPR, chest pain, shortness of breath, lightheadedness, or dizziness.  She takes iron once weekly currently.  Her anemia is likely in the setting of chronic disease given CKD with most recent creatinine in August at 1.8.  She is to have another renal ultrasound upcoming and will be seeing Dr. Rachele.  I recommend for us  to recheck her hemoglobin as well as her iron panel currently as she may need to increase her intake of iron.  Colonoscopy up-to-date with removal of 2 polyps in January 2024.  Last EGD in May 2023 with bleeding gastric polyps.  We have discussed potentially pursuing capsule study if she has significant drop in hemoglobin.  PLAN   CBC, iron panel Amitiza  8 mcg daily.  Continue iron once weekly.  High fiber diet Gas ex as needed.  Discussed potential capsule study pending lab results. Follow up in 6 months.    Charmaine Melia, MSN, FNP-BC, AGACNP-BC [facility]

## 2023-06-10 ENCOUNTER — Ambulatory Visit (INDEPENDENT_AMBULATORY_CARE_PROVIDER_SITE_OTHER): Payer: Medicare Other | Admitting: Gastroenterology

## 2023-06-10 ENCOUNTER — Encounter: Payer: Self-pay | Admitting: Gastroenterology

## 2023-06-10 VITALS — BP 139/62 | HR 59 | Temp 98.7°F | Resp 98 | Ht 64.0 in | Wt 140.0 lb

## 2023-06-10 DIAGNOSIS — Z8719 Personal history of other diseases of the digestive system: Secondary | ICD-10-CM

## 2023-06-10 DIAGNOSIS — K5900 Constipation, unspecified: Secondary | ICD-10-CM | POA: Diagnosis not present

## 2023-06-10 DIAGNOSIS — K5904 Chronic idiopathic constipation: Secondary | ICD-10-CM

## 2023-06-10 DIAGNOSIS — D649 Anemia, unspecified: Secondary | ICD-10-CM

## 2023-06-10 MED ORDER — LUBIPROSTONE 8 MCG PO CAPS: 8.0000 ug | ORAL_CAPSULE | Freq: Every day | ORAL | 3 refills | Status: DC

## 2023-06-10 NOTE — Patient Instructions (Addendum)
 I am providing you with some lab slips today.  Please take these with you when you go to have your blood work for Dr. Rachele (the kidney doctor).   I have refilled your Amitiza .  Continue to take 8 mcg a few times a week.  If you begin to feel more constipated you can take this every morning with food.  The goal is for you to have a good soft bowel movement once daily or every other day.  Continue a high-fiber diet.  You may use Gas-X, Phazyme, or Beano for gassiness.  Avoid gas-producing foods (eg, garlic, beans,cabbage, legumes, onions, broccoli, brussel sprouts, wheat, and potatoes).   Follow-up in 6 months.  Sooner if needed.  It was a pleasure to see you today. I want to create trusting relationships with patients. If you receive a survey regarding your visit,  I greatly appreciate you taking time to fill this out on paper or through your MyChart. I value your feedback.  Charmaine Melia, MSN, FNP-BC, AGACNP-BC [facility]

## 2023-06-24 ENCOUNTER — Ambulatory Visit (HOSPITAL_COMMUNITY)
Admission: RE | Admit: 2023-06-24 | Discharge: 2023-06-24 | Disposition: A | Discharge status: Home / Self Care | Payer: Medicare Other | Source: Ambulatory Visit | Attending: Nephrology | Admitting: Nephrology

## 2023-06-24 DIAGNOSIS — E118 Type 2 diabetes mellitus with unspecified complications: Secondary | ICD-10-CM | POA: Insufficient documentation

## 2023-06-24 DIAGNOSIS — N189 Chronic kidney disease, unspecified: Secondary | ICD-10-CM | POA: Insufficient documentation

## 2023-06-24 DIAGNOSIS — N1832 Chronic kidney disease, stage 3b: Secondary | ICD-10-CM | POA: Insufficient documentation

## 2023-12-04 ENCOUNTER — Emergency Department
Admission: EM | Admit: 2023-12-04 | Discharge: 2023-12-04 | Disposition: A | Discharge status: Home / Self Care | Attending: Emergency Medicine | Admitting: Emergency Medicine

## 2023-12-04 ENCOUNTER — Other Ambulatory Visit: Payer: Self-pay

## 2023-12-04 DIAGNOSIS — N390 Urinary tract infection, site not specified: Secondary | ICD-10-CM | POA: Insufficient documentation

## 2023-12-04 DIAGNOSIS — E119 Type 2 diabetes mellitus without complications: Secondary | ICD-10-CM | POA: Diagnosis not present

## 2023-12-04 DIAGNOSIS — M545 Low back pain, unspecified: Secondary | ICD-10-CM | POA: Insufficient documentation

## 2023-12-04 DIAGNOSIS — R350 Frequency of micturition: Secondary | ICD-10-CM | POA: Diagnosis present

## 2023-12-04 LAB — CBC WITH DIFFERENTIAL/PLATELET
Abs Immature Granulocytes: 0.04 K/uL (ref 0.00–0.07)
Basophils Absolute: 0 K/uL (ref 0.0–0.1)
Basophils Relative: 0 %
Eosinophils Absolute: 0.2 K/uL (ref 0.0–0.5)
Eosinophils Relative: 2 %
HCT: 28.7 % — ABNORMAL LOW (ref 36.0–46.0)
Hemoglobin: 9.4 g/dL — ABNORMAL LOW (ref 12.0–15.0)
Immature Granulocytes: 0 %
Lymphocytes Relative: 17 %
Lymphs Abs: 1.7 K/uL (ref 0.7–4.0)
MCH: 31.2 pg (ref 26.0–34.0)
MCHC: 32.8 g/dL (ref 30.0–36.0)
MCV: 95.3 fL (ref 80.0–100.0)
Monocytes Absolute: 1.1 K/uL — ABNORMAL HIGH (ref 0.1–1.0)
Monocytes Relative: 11 %
Neutro Abs: 7 K/uL (ref 1.7–7.7)
Neutrophils Relative %: 70 %
Platelets: 323 K/uL (ref 150–400)
RBC: 3.01 MIL/uL — ABNORMAL LOW (ref 3.87–5.11)
RDW: 12.7 % (ref 11.5–15.5)
WBC: 10 K/uL (ref 4.0–10.5)
nRBC: 0 % (ref 0.0–0.2)

## 2023-12-04 LAB — URINALYSIS, ROUTINE W REFLEX MICROSCOPIC
Bilirubin Urine: NEGATIVE
Glucose, UA: NEGATIVE mg/dL
Hgb urine dipstick: NEGATIVE
Ketones, ur: NEGATIVE mg/dL
Nitrite: NEGATIVE
Protein, ur: NEGATIVE mg/dL
Specific Gravity, Urine: 1.006 (ref 1.005–1.030)
pH: 6 (ref 5.0–8.0)

## 2023-12-04 LAB — COMPREHENSIVE METABOLIC PANEL WITH GFR
ALT: 13 U/L (ref 0–44)
AST: 15 U/L (ref 15–41)
Albumin: 3.7 g/dL (ref 3.5–5.0)
Alkaline Phosphatase: 69 U/L (ref 38–126)
Anion gap: 11 (ref 5–15)
BUN: 34 mg/dL — ABNORMAL HIGH (ref 8–23)
CO2: 25 mmol/L (ref 22–32)
Calcium: 8.9 mg/dL (ref 8.9–10.3)
Chloride: 96 mmol/L — ABNORMAL LOW (ref 98–111)
Creatinine, Ser: 2.07 mg/dL — ABNORMAL HIGH (ref 0.44–1.00)
GFR, Estimated: 24 mL/min — ABNORMAL LOW (ref >60)
Glucose, Bld: 121 mg/dL — ABNORMAL HIGH (ref 70–99)
Potassium: 4.4 mmol/L (ref 3.5–5.1)
Sodium: 132 mmol/L — ABNORMAL LOW (ref 135–145)
Total Bilirubin: 0.2 mg/dL (ref 0.0–1.2)
Total Protein: 7.6 g/dL (ref 6.5–8.1)

## 2023-12-04 MED ORDER — CEPHALEXIN 500 MG PO CAPS: 500.0000 mg | ORAL_CAPSULE | Freq: Three times a day (TID) | ORAL | 0 refills | Status: DC

## 2023-12-04 MED ORDER — CEPHALEXIN 500 MG PO CAPS: 500.0000 mg | ORAL_CAPSULE | Freq: Three times a day (TID) | ORAL | 0 refills | Status: AC

## 2023-12-04 NOTE — ED Provider Notes (Signed)
 [facility] Provider Note    Event Date/Time   First MD Initiated Contact with Patient 12/04/23 1341     (approximate)   History   Back Pain and Urinary Frequency   HPI  Amber Herman is a 78 y.o. female with a history of diabetes, atrial fibrillation on blood thinner, hypertension who presents with multiple complaints, chief among them is bilateral low back pain, intermittent urinary hesitancy.  She also describes intermittent pain in her hands.  Has seen her PCP for this and had been prescribed tramadol  and Flexeril  with only mild improvement however she has been taking medication sporadically.  No fevers no flank pain     Physical Exam   Triage Vital Signs: ED Triage Vitals  Encounter Vitals Group     BP 12/04/23 1317 (!) 155/57     Girls Systolic BP Percentile --      Girls Diastolic BP Percentile --      Boys Systolic BP Percentile --      Boys Diastolic BP Percentile --      Pulse Rate 12/04/23 1317 79     Resp 12/04/23 1317 18     Temp 12/04/23 1317 97.8 F (36.6 C)     Temp Source 12/04/23 1317 Oral     SpO2 12/04/23 1317 100 %     Weight --      Height --      Head Circumference --      Peak Flow --      Pain Score 12/04/23 1314 2     Pain Loc --      Pain Education --      Exclude from Growth Chart --     Most recent vital signs: Vitals:   12/04/23 1415 12/04/23 1418  BP:    Pulse: 80   Resp:    Temp:    SpO2: 100% 100%     General: Awake, no distress.  CV:  Good peripheral perfusion.  Resp:  Normal effort.  Abd:  No distention.  Soft, nontender, no bruit, normal pulses in the lower extremities bilateral Other:  Back exam is reassuring, no vertebral tenderness to palpation, no rash, mild paraspinal tenderness on the right, no CVA tenderness   ED Results / Procedures / Treatments   Labs (all labs ordered are listed, but only abnormal results are displayed) Labs Reviewed  CBC WITH DIFFERENTIAL/PLATELET -  Abnormal; Notable for the following components:      Result Value   RBC 3.01 (*)    Hemoglobin 9.4 (*)    HCT 28.7 (*)    Monocytes Absolute 1.1 (*)    All other components within normal limits  COMPREHENSIVE METABOLIC PANEL WITH GFR - Abnormal; Notable for the following components:   Sodium 132 (*)    Chloride 96 (*)    Glucose, Bld 121 (*)    BUN 34 (*)    Creatinine, Ser 2.07 (*)    GFR, Estimated 24 (*)    All other components within normal limits  URINALYSIS, ROUTINE W REFLEX MICROSCOPIC - Abnormal; Notable for the following components:   Color, Urine STRAW (*)    APPearance CLEAR (*)    Leukocytes,Ua LARGE (*)    Bacteria, UA RARE (*)    Non Squamous Epithelial PRESENT (*)    All other components within normal limits     EKG     RADIOLOGY     PROCEDURES:  Critical Care performed:   Procedures  MEDICATIONS ORDERED IN ED: Medications - No data to display   IMPRESSION / MDM / ASSESSMENT AND PLAN / ED COURSE  I reviewed the triage vital signs and the nursing notes. Patient's presentation is most consistent with acute illness / injury with system symptoms.  Patient presents with symptoms as above, overall she is well-appearing and in no acute distress, she is ambulating well without difficulty.  She does wince when she gets out of bed when she twists her torso, suspicious for musculoskeletal back pain, not consistent with pyelonephritis, lab work is reassuring.  She does have likely mild UTI which may be partially into her symptoms, will start her on p.o. Keflex , no indication for admission at this time, discussed scheduled Tylenol  and tramadol  during acute phase of illness, close outpatient follow-up recommended, no indication for admission.        FINAL CLINICAL IMPRESSION(S) / ED DIAGNOSES   Final diagnoses:  Lower urinary tract infectious disease  Right-sided low back pain without sciatica, unspecified chronicity     Rx / DC Orders   ED  Discharge Orders          Ordered    cephALEXin  (KEFLEX ) 500 MG capsule  3 times daily,   Status:  Discontinued        12/04/23 1437    cephALEXin  (KEFLEX ) 500 MG capsule  3 times daily        12/04/23 1447             Note:  This document was prepared using Dragon voice recognition software and may include unintentional dictation errors.   Arlander Charleston, MD 12/04/23 1520

## 2023-12-04 NOTE — ED Notes (Signed)
 Fall precautions in place for Pt. This RN placed fall band, fall grip socks, bed alarm and fall sign.

## 2023-12-04 NOTE — ED Triage Notes (Addendum)
 Came from [facility], for lower back pain.  States having urinary issues of frequency and Daughter has noted new confusion. States been having symptoms for last week. Took Muscle relaxer this morning but wearing off.

## 2023-12-08 ENCOUNTER — Ambulatory Visit: Payer: Medicare Other | Admitting: Gastroenterology

## 2023-12-21 NOTE — Progress Notes (Unsigned)
 GI Office Note    Referring Provider: [name], NP Primary Care Physician:  [name], NP Primary Gastroenterologist: Carlin POUR. Cindie, DO  Date:  12/22/2023  ID:  Amber Herman, DOB 07-16-1945, MRN [id number]  Chief Complaint   Chief Complaint  Patient presents with   Follow-up    Follow up on constipation. Pt states she is better   History of Present Illness  Amber Herman is a 78 y.o. female with a history of A-fib on Eliquis , HTN, NSTEMI in 2020, diabetes, hypothyroidism, TIA, anxiety, and anemia presenting today to follow u-p on constipation.  Hospitalization May 2060for anemia and dark stools.  Hemoglobin stable at 10.9, stool heme-negative.  CTA GI bleed showed questionable area of sigmoid diverticulitis.  She underwent EGD which showed multiple gastric polyps with stigmata of recent bleeding, polyps removed with hot snare and 3 clips placed at 2 different sites.  Pathology revealed hyperplastic gastric polyps, gastric biopsy with mild hyperemia but no H. pylori.  She was encouraged to have outpatient colonoscopy 6 to 8 weeks after discharge.   Labs 03/25/22: Hemoglobin 9.5, MCV 93, platelets 336.   Colonoscopy 06/02/22: -pan colonic diverticulosis -2mm polyp in cecum -6mm ascending colon polyp -polyps sessile serrated adenomas.  -no repeat colonoscopy due to age.    Last office visit 08/18/22. Having daily bowel movement, time sometimes more.  Suffering from incomplete emptying and not having any loose stools.  Taking MiraLAX  as needed with daily probiotic.  Admits to not drinking enough water.  Having issues with motion sickness and morning nausea.  Was waking up dizzy in the mornings after taking prazosin .  Had some improvement after taking only 1 dose of medication but was not having any dizziness or nausea prior to this.  Symptoms were suspected be secondary to medication and encouraged her to discuss this with PCP.  Taking iron once daily having some  darker stools.  Denied any issues with hemorrhoids.  Continue to have fatigue but it has been about the same as her baseline. Exercising daily.   OV 02/22/23.  Not taking MiraLAX .  Having to strain with bowel movements.  Sometimes stools hard and sometimes softer.  Tries to go daily but continues to struggle.  Also having lower abdominal pain which does improve after bowel movements.  Also with some mild bloating and not taking a probiotic.  Does take iron daily and [facility] the senior center.  Takes tramadol  as needed for back pain.  Denied any upper GI symptoms. Advised Amitiza  8 mcg daily.  Continue MiraLAX  daily in coffee or water until she was able to start Amitiza .  Increase fiber and water intake daily.  Continue daily iron supplementation.  Repeat labs at next follow-up and consider capsule endoscopy if further drop in hemoglobin.   Reviewed [facility] database: Labs August 2024 - hemoglobin 9.6, Potassium 5.2, sodium 133, cr 1.8. normal LFTs. TSH 30 and T4 0.61   Last office visit 06/10/23.  Constipation fairly well-controlled with Amitiza .  Not taking MiraLAX  or Colace and having a good daily bowel movement.  Had been drinking sodas and less water.  Admitted to some more feelings of cold but denied chest pain, dizziness, lightheadedness, or falls.  CBC and iron panel ordered.  Advised continue Amitiza  and iron.  CBC and iron panel not performed after last visit.  ED visit 12/04/2023 for UTI.  Hemoglobin 9.4, MCV 95.3, sodium 132, creatinine 2.07, GFR 24.  Normal LFTs.  Albumin 3.7.  UA with rare bacteria, large leukocyte Estrace.  Was given Keflex  for treatment.     Latest Ref Rng & Units 12/04/2023    1:15 PM 05/08/2022    2:10 PM 03/25/2022   10:59 AM  CBC  WBC 4.0 - 10.5 K/uL 10.0  16.3  8.7   Hemoglobin 12.0 - 15.0 g/dL 9.4  9.8  9.5   Hematocrit 36.0 - 46.0 % 28.7  30.1  28.1   Platelets 150 - 400 K/uL 323  409  336      Today:  She can not remember the last time she took  Amitiza  but states she is having a BM 1-2 times a day. Not straining. No melena or brbpr. States stool is a dark green color at times and states it is brown like its supposed to be as well. No overt melena. Still going to senior center and exercising. Tries to do 1-2 miles per day.   States she got motion sickness on the [place] on the way here this morning. Does not have anything for dizziness and motion sickness. States not a while after she got sick and vomited. She states she did take Dexilant this morning.   Denies chest pain or shortness of breath.   Denies trouble swallowing and N/V other than this morning.    Wt Readings from Last 5 Encounters:  12/22/23 144 lb 9.6 oz (65.6 kg)  06/10/23 140 lb (63.5 kg)  02/22/23 138 lb 6.4 oz (62.8 kg)  08/18/22 142 lb (64.4 kg)  06/02/22 141 lb (64 kg)    Current Outpatient Medications  Medication Sig Dispense Refill   acetaminophen  (TYLENOL ) 325 MG tablet Take 2 tablets (650 mg total) by mouth every 8 (eight) hours as needed. 60 tablet 0   amiodarone  (PACERONE ) 200 MG tablet TAKE 1 TABLET BY MOUTH ONCE DAILY. 30 tablet 6   amLODipine  (NORVASC ) 5 MG tablet Take 5 mg by mouth 2 (two) times daily.     apixaban  (ELIQUIS ) 5 MG TABS tablet Take 1 tablet (5 mg total) by mouth 2 (two) times daily. 60 tablet 1   eplerenone (INSPRA) 25 MG tablet Take 25 mg by mouth daily.     ergocalciferol  (VITAMIN D2) 1.25 MG (50000 UT) capsule Take 50,000 Units by mouth once a week.     FEROSUL 325 (65 Fe) MG tablet Take 325 mg by mouth daily.      levothyroxine  (SYNTHROID ) 100 MCG tablet Take 100 mcg by mouth daily.     losartan  (COZAAR ) 50 MG tablet Take 1 tablet (50 mg total) by mouth 2 (two) times daily. 180 tablet 3   magnesium  oxide (MAG-OX) 400 (241.3 Mg) MG tablet TAKE 1 TABLET BY MOUTH ONCE DAILY. 28 tablet 11   metoprolol  succinate (TOPROL -XL) 50 MG 24 hr tablet Take 50 mg by mouth daily.     ondansetron  (ZOFRAN -ODT) 4 MG disintegrating tablet Take 1 tablet  (4 mg total) by mouth every 8 (eight) hours as needed for nausea or vomiting. 10 tablet 0   pantoprazole  (PROTONIX ) 40 MG tablet Take 1 tablet (40 mg total) by mouth 2 (two) times daily before a meal. 60 tablet 2   pravastatin  (PRAVACHOL ) 40 MG tablet Take 40 mg by mouth at bedtime.      traMADol  (ULTRAM ) 50 MG tablet Take 50 mg by mouth every 6 (six) hours as needed for severe pain or moderate pain.     TRESIBA  FLEXTOUCH 200 UNIT/ML SOPN Inject 0-20 Units into the skin at bedtime.  vitamin B-12 (CYANOCOBALAMIN) 1000 MCG tablet Take 1,000 mcg by mouth daily.      lubiprostone  (AMITIZA ) 8 MCG capsule Take 1 capsule (8 mcg total) by mouth daily with breakfast. (Patient not taking: Reported on 12/22/2023) 60 capsule 3   metFORMIN  (GLUCOPHAGE ) 850 MG tablet Take 850 mg by mouth 2 (two) times daily with a meal.  (Patient not taking: Reported on 12/22/2023)     No current facility-administered medications for this visit.    Past Medical History:  Diagnosis Date   Allergic rhinitis    Anemia    Atrial fibrillation (HCC)    Diagnosed 2013   DM (diabetes mellitus), type 2 (HCC)    GERD (gastroesophageal reflux disease)    Hyperlipidemia    Hypertension    Hypothyroidism    Nephrolithiasis    Rotator cuff tear    Thyroid  disease    Transient ischemic attack (TIA)    Vitamin D  deficiency     Past Surgical History:  Procedure Laterality Date   BIOPSY  10/05/2021   Procedure: BIOPSY;  Surgeon: Elicia Claw, MD;  Location: [facility];  Service: Gastroenterology;;   BIOPSY  06/02/2022   Procedure: BIOPSY;  Surgeon: Cindie Carlin POUR, DO;  Location: [facility];  Service: Endoscopy;;   CARDIOVERSION N/A 02/22/2019   Procedure: CARDIOVERSION;  Surgeon: Kate Lonni CROME, MD;  Location: [facility] ENDOSCOPY;  Service: Endoscopy;  Laterality: N/A;   CHOLECYSTECTOMY     COLONOSCOPY     normal per patient, more than 10 years ago   COLONOSCOPY WITH PROPOFOL  N/A 06/02/2022   Procedure:  COLONOSCOPY WITH PROPOFOL ;  Surgeon: Cindie Carlin POUR, DO;  Location: [facility];  Service: Endoscopy;  Laterality: N/A;  7:30 am   ESOPHAGOGASTRODUODENOSCOPY (EGD) WITH PROPOFOL  N/A 10/05/2021   Procedure: ESOPHAGOGASTRODUODENOSCOPY (EGD) WITH PROPOFOL ;  Surgeon: Elicia Claw, MD;  Location: [facility];  Service: Gastroenterology;  Laterality: N/A;   HEMOSTASIS CLIP PLACEMENT  10/05/2021   Procedure: HEMOSTASIS CLIP PLACEMENT;  Surgeon: Elicia Claw, MD;  Location: [facility];  Service: Gastroenterology;;   LEFT HEART CATH AND CORONARY ANGIOGRAPHY N/A 02/21/2019   Procedure: LEFT HEART CATH AND CORONARY ANGIOGRAPHY;  Surgeon: Wonda Sharper, MD;  Location[facility] INVASIVE CV LAB;  Service: Cardiovascular;  Laterality: N/A;   POLYPECTOMY  10/05/2021   Procedure: POLYPECTOMY;  Surgeon: Elicia Claw, MD;  Location: [facility];  Service: Gastroenterology;;   POLYPECTOMY  06/02/2022   Procedure: POLYPECTOMY;  Surgeon: Cindie Carlin POUR, DO;  Location: [facility];  Service: Endoscopy;;   TEE WITHOUT CARDIOVERSION N/A 02/22/2019   Procedure: TRANSESOPHAGEAL ECHOCARDIOGRAM (TEE);  Surgeon: Kate Lonni CROME, MD;  Location: [facility] ENDOSCOPY;  Service: Endoscopy;  Laterality: N/A;    Family History  Problem Relation Age of Onset   Heart disease Mother    Alzheimer's disease Mother    Diabetes Mother    Skin cancer Mother    Hypertension Father    Diabetes Father    Heart disease Brother    Breast cancer Daughter    Colon cancer Neg Hx     Allergies as of 12/22/2023 - Review Complete 12/22/2023  Allergen Reaction Noted   Angiotensin receptor blockers  11/25/2011   Calcium channel blockers  11/25/2011   Elemental sulfur Itching 07/24/2021   Latex Hives and Itching 02/21/2019   Minoxidil Nausea Only 04/17/2021   Nicardipine hcl  09/18/2014   Simvastatin  11/25/2011   Clonidine  Other (See Comments) 08/18/2019   Sulfa antibiotics Itching and Rash 11/25/2011   Sulfacetamide  sodium Itching and Rash 09/18/2014   Sulfasalazine Itching and Rash 11/25/2011    Social History   Socioeconomic History   Marital status: Widowed    Spouse name: Not on file   Number of children: Not on file   Years of education: Not on file   Highest education level: Not on file  Occupational History   Not on file  Tobacco Use   Smoking status: Never   Smokeless tobacco: Never  Vaping Use   Vaping status: Never Used  Substance and Sexual Activity   Alcohol  use: No   Drug use: No   Sexual activity: Not Currently  Other Topics Concern   Not on file  Social History Narrative   Not on file   Social Drivers of Health   Financial Resource Strain: Low Risk  (04/01/2020)   [facility]   Overall Financial Resource Strain (CARDIA)    Difficulty of Paying Living Expenses: Not very hard  Food Insecurity: No Food Insecurity (04/01/2020)   Received from [facility]   Hunger Vital Sign    Within the past 12 months, you worried that your food would run out before you got the money to buy more.: Never true    Within the past 12 months, the food you bought just didn't last and you didn't have money to get more.: Never true  Transportation Needs: No Transportation Needs (03/17/2021)   Received [facility] - Transportation    In the past 12 months, has lack of transportation kept you from medical appointments or from getting medications?: No    Lack of Transportation (Non-Medical): No  Physical Activity: Unknown (02/20/2019)   Exercise Vital Sign    Days of Exercise per Week: Patient declined    Minutes of Exercise per Session: Patient declined  Stress: Stress Concern Present (02/20/2019)   [employer] [facility] [employer] - Occupational Stress Questionnaire    Feeling of Stress : Rather much  Social Connections: Unknown (10/03/2021)   Received from [facility]   Social Network    Social Network: Not on file     Review  of Systems   Gen: Denies fever, chills, anorexia. Denies fatigue, weakness, weight loss.  CV: Denies chest pain, palpitations, syncope, peripheral edema, and claudication. Resp: Denies dyspnea at rest, cough, wheezing, coughing up blood, and pleurisy. GI: See HPI Derm: Denies rash, itching, dry skin Psych: Denies depression, anxiety, memory loss, confusion. No homicidal or suicidal ideation.  Heme: Denies bruising, bleeding, and enlarged lymph nodes.  Physical Exam   BP (!) 161/63   Pulse 60   Temp 98.6 F (37 C)   Ht 5' 4 (1.626 m)   Wt 144 lb 9.6 oz (65.6 kg)   BMI 24.82 kg/m   General:   Alert and oriented. No distress noted. Pleasant and cooperative.  Head:  Normocephalic and atraumatic. Eyes:  Conjuctiva clear without scleral icterus. Mouth:  Oral mucosa pink and moist. Good dentition. No lesions. Abdomen:  +BS, soft, non-tender and non-distended. No rebound or guarding. No HSM or masses noted. Rectal: deferred Msk:  Symmetrical without gross deformities. Normal posture. Extremities:  Without edema. Neurologic:  Alert and  oriented x4 Psych:  Alert and cooperative. Normal mood and affect.  Assessment  Amber Herman is a 78 y.o. female presenting today to follow up on constipation.   Anemia: -Has had a history of bleeding gastric polyps on EGD in 2023 -Colonoscopy up-to-date with removal  of 2 polyps. -Most recent hemoglobin in the ED visit was stable at 9.4, was 9.6 in August 2024 and 9.8 in December 2023. - Prior anemia likely secondary to some chronic blood loss however per labs she has chronic anemia which is likely secondary to CKD (as creatinine has been elevated chronically) -follows with Dr. Rachele. - Denies any recent melena or BRBPR.  Stools sometimes dark green but no overt blood presents. - She will continue daily iron.  Constipation: No longer requiring Amtiiza. Having good daily BM without any medication.  Tries to keep up with her diet and regular  exercise.  Encouraged good water intake given her CKD as well.  GERD: Recently switched by PCP from pantoprazole  to Dexilant.  Reports she was having some breakthrough symptoms previously therefore she was switched.  Currently doing well.  Only having nausea/vomiting secondary to motion sickness currently.  Denies any dysphagia.  PLAN   Continue iron daily at noon and B12 Continue high-fiber diet Continue Dexilant 60 mg daily.  Meclizine as needed otc for motion sickness.  Goal 64 ounces water daily. Advised over-the-counter Dramamine (meclizine) as needed for motion sickness.  Encouraged ongoing discussion with PCP regarding this. Follow up in 1 year    Charmaine Melia, MSN, FNP-BC, AGACNP-BC [facility]

## 2023-12-22 ENCOUNTER — Ambulatory Visit (INDEPENDENT_AMBULATORY_CARE_PROVIDER_SITE_OTHER): Admitting: Gastroenterology

## 2023-12-22 ENCOUNTER — Encounter: Payer: Self-pay | Admitting: Gastroenterology

## 2023-12-22 VITALS — BP 161/63 | HR 60 | Temp 98.6°F | Ht 64.0 in | Wt 144.6 lb

## 2023-12-22 DIAGNOSIS — K5900 Constipation, unspecified: Secondary | ICD-10-CM | POA: Diagnosis not present

## 2023-12-22 DIAGNOSIS — D649 Anemia, unspecified: Secondary | ICD-10-CM | POA: Diagnosis not present

## 2023-12-22 DIAGNOSIS — K219 Gastro-esophageal reflux disease without esophagitis: Secondary | ICD-10-CM

## 2023-12-22 DIAGNOSIS — K922 Gastrointestinal hemorrhage, unspecified: Secondary | ICD-10-CM

## 2023-12-22 DIAGNOSIS — K5904 Chronic idiopathic constipation: Secondary | ICD-10-CM

## 2023-12-22 NOTE — Patient Instructions (Addendum)
 You may take 1/2 to 1 tablet of meclizine before car rides as needed. It can make you a little drowsy so do not drive.     Continue exercise and good water intake (64oz daily). Continue dexilant 60 mg once daily before breakfast and iron daily at noon.   Please make sure you get your updated medication list from the pharmacy with your packs.   Follow up in 1 year, sooner if needed.   It was a pleasure to see you today. I want to create trusting relationships with patients. If you receive a survey regarding your visit,  I greatly appreciate you taking time to fill this out on paper or through your MyChart. I value your feedback.  Charmaine Melia, MSN, FNP-BC, AGACNP-BC [facility]

## 2024-01-26 NOTE — Progress Notes (Signed)
 [facility]  -  RETURN VISIT   PRIMARY CARE PROVIDER:   [name], NP [address] [place] [address] [phone]                 Amber Herman 01/26/2024  DOB: 1946-04-20 Age: 78 y.o.   PRIMARY CARDIOLOGIST : Amber Hays, MD   HPI   PATIENT PROFILE: Amber Herman  is a 78 y.o. female with a PMH of significant for pAF, HTN, nonobstructive CAD (2020 cath [facility]), DMII, hypothyroid, TIA  who presents for evaluation and management of the following problems:  1. On amiodarone  therapy   2. Mixed hyperlipidemia   3. Primary hypertension   4. Paroxysmal atrial fibrillation (CMS/HHS-HCC)   5. Coronary artery disease involving native coronary artery of native heart without angina pectoris    CLINICAL SUMMARY: Interval Histories copied/summarized from previous clinic notes for reference:  07/27/23 Amber Herman falls.  Tolerating meds now.   BP at home this AM was 140.  Typically 110-140 at home.  Herman CP.  Herman DOE.  Herman swelling, had some in the past now gone.  Herman orthopnea.  Does 2 miles total a day using machines over 2 hrs.  Herman problems with her meds except too many  INTERVAL HISTORY: 01/26/2024 Amber Herman)  History of Present Illness Amber Herman is a 78 year old female with paroxysmal atrial fibrillation, hypertension who presents for routine cardiology follow-up.  She has a history of paroxysmal atrial fibrillation and is currently taking amiodarone . She is unsure of the last time she experienced atrial fibrillation. Herman chest pain or shortness of breath, but she feels fatigued throughout the day.  Her blood pressure has been fluctuating at home, with a reading of 113 mmHg this morning, but it is sometimes higher. She exercises regularly, about three to four days a week, and was previously exercising five days a week.  Her A1c was 8.4 in July, and she was started on Tresiba  at that time. She is Herman longer taking metformin , as it was  replaced with Tresiba . She is also taking Synthroid , although she did not take it this morning.  Her LDL cholesterol was 102, and she is currently on pravastatin .  She also mentions some swelling but notes it has not been recent due to increased activity.  She also mentions waking up to urinate at night but denies waking up due to shortness of breath. She sleeps on three pillows but states it is by preference rather than due to shortness of breath.   HISTORY   PROBLEM LIST: Patient Active Problem List  Diagnosis  . Anemia  . Diabetes mellitus, type II (CMS/HHS-HCC)  . Change in bowel function  . Hypertension  . Hypomagnesemia  . Hyponatremia  . Nausea with vomiting, unspecified  . TIA (transient ischemic attack)  . Acute confusion, unspecified  . Loss of memory  . Paroxysmal atrial fibrillation (CMS/HHS-HCC)  . Anxiety  . Flatulence  . GERD (gastroesophageal reflux disease)  . Pyelonephritis  . Urinary tract infection  . Body mass index (BMI) 25.0-25.9, adult  . Dry eyes  . Hypothyroidism, unspecified  . Mixed hyperlipidemia  . Unspecified abnormal finding in specimens from other organs, systems and tissues  . Vitamin D  deficiency  . Type 2 diabetes mellitus without complications (CMS/HHS-HCC)  . Gastroesophageal reflux disease  . Long term (current) use of insulin  (CMS/HHS-HCC)  . Hypertensive urgency   SOCIAL HX: Social History   Socioeconomic History  . Marital status:  Widowed  Tobacco Use  . Smoking status: Never    Passive exposure: Never  . Smokeless tobacco: Never  Vaping Use  . Vaping status: Never Used  Substance and Sexual Activity  . Alcohol  use: Herman  . Drug use: Herman  . Sexual activity: Defer   Social Drivers of Health   Financial Resource Strain: Low Risk  (04/01/2020)   [facility]   Overall Financial Resource Strain (CARDIA)   . Difficulty of Paying Living Expenses: Not very hard  Food Insecurity: Herman Food Insecurity (04/01/2020)    [facility]   Hunger Vital Sign   . Within the past 12 months, you worried that your food would run out before you got the money to buy more.: Never true   . Within the past 12 months, the food you bought just didn't last and you didn't have money to get more.: Never true  Transportation Needs: Herman Transportation Needs (03/17/2021)   PRAPARE - Transportation   . Lack of Transportation (Medical): Herman   . Lack of Transportation (Non-Medical): Herman  Physical Activity: Unknown (02/20/2019)   Received from [facility]   Exercise Vital Sign   . On average, how many days per week do you engage in moderate to strenuous exercise (like a brisk walk)?: Patient declined   . On average, how many minutes do you engage in exercise at this level?: Patient declined  Stress: Stress Concern Present (02/20/2019)   Received from [facility] - Occupational Stress Questionnaire   . Feeling of Stress : Rather much  Social Connections: Moderately Isolated (02/20/2019)   Received from [facility]   Social Connection and Isolation Panel   . In a typical week, how many times do you talk on the phone with family, friends, or neighbors?: More than three times a week   . How often do you get together with friends or relatives?: Twice a week   . How often do you attend church or religious services?: 1 to 4 times per year   . Do you belong to any clubs or organizations such as church groups, unions, fraternal or athletic groups, or school groups?: Herman   . How often do you attend meetings of the clubs or organizations you belong to?: Never   . Are you married, widowed, divorced, separated, never married, or living with a partner?: Widowed  Housing Stability: Unknown (07/27/2023)   Housing Stability Vital Sign   . Homeless in the Last Year: Herman    FAMILY HX: Family History  Problem Relation Age of Onset  . Herman Known Problems Mother   . Herman Known Problems Father      CURRENT MEDICATIONS AND ALLERGIES   Current Outpatient Medications  Medication Sig Dispense Refill  . AMIOdarone  (PACERONE ) 200 MG tablet TAKE 1/2 TABLET BY MOUTH AT BEDTIME 45 tablet 3  . amLODIPine  (NORVASC ) 5 MG tablet Take 1 tablet (5 mg total) by mouth 2 (two) times daily 180 tablet 3  . apixaban  (ELIQUIS ) 5 mg tablet Take 5 mg by mouth every 12 (twelve) hours    . cyanocobalamin (VITAMIN B12) 1000 MCG tablet Take 1,000 mcg by mouth once daily    . cyclobenzaprine  (FLEXERIL ) 10 MG tablet 1 tablet as needed Orally Three times a day; Duration: 10 days    . eplerenone (INSPRA) 25 MG tablet Take 1 tablet (25 mg total) by mouth once daily TAKE AT NOON 30 tablet 11  .  ferrous sulfate 325 (65 FE) MG tablet Take 325 mg by mouth daily with breakfast    . FUROsemide (LASIX) 20 MG tablet Take 1 tablet (20 mg total) by mouth once daily as needed for Edema 30 tablet 11  . insulin  DEGLUDEC (TRESIBA  FLEXTOUCH) pen injector (concentration 100 units/mL) Inject 16 Units subcutaneously once daily Patient stated taking this insulin  as needed.    . losartan  (COZAAR ) 50 MG tablet TAKE 1 TABLET BY MOUTH TWICE DAILY 180 tablet 1  . magnesium  oxide (MAG-OX) 400 mg (241.3 mg magnesium ) tablet TAKE 1 TABLET BY MOUTH TWICE DAILY 180 tablet 3  . metoprolol  succinate (TOPROL -XL) 50 MG XL tablet Take 1 tablet (50 mg total) by mouth at bedtime 30 tablet 11  . metroNIDAZOLE  (METROCREAM ) 0.75 % cream     . nitroGLYcerin  (NITROSTAT ) 0.4 MG SL tablet prn      . ondansetron  (ZOFRAN ) 8 MG tablet 1 tablet as needed    . pravastatin  (PRAVACHOL ) 40 MG tablet Take 40 mg by mouth nightly.    . prazosin  (MINIPRESS ) 1 MG capsule TAKE ONE CAPSULE BY MOUTH AT BEDTIME 30 capsule 3  . SYNTHROID  100 mcg tablet TAKE ONE TABLET BY MOUTH IN THE MORNING ON AN EMPTY STOMACH.; Duration: 30    . traMADoL  (ULTRAM ) 50 mg tablet 1 tablet twice daily Orally as needed for pain; Duration: 30 days    . TRESIBA  FLEXTOUCH U-200 pen injector  (concentration 200 units/mL) Inject subcutaneously Using sliding scale after checking blood glucose nightly.  Usually 16-20 units.    SABRA ezetimibe (ZETIA) 10 mg tablet Take 1 tablet (10 mg total) by mouth once daily 90 tablet 3  . levothyroxine  (SYNTHROID ) 75 MCG tablet Take 1 tablet (75 mcg total) by mouth once daily Take on an empty stomach with a glass of water at least 30-60 minutes before breakfast. 30 tablet 11   Herman current facility-administered medications for this visit.    Allergies  Allergen Reactions  . Clonidine  Nausea and Syncope  . Amlodipine  Unknown  . Arb-Angiotensin Receptor Antagonist Unknown  . Calcium Channel Blocking Agent Diltiazem  Analogues Unknown  . Escitalopram Other (See Comments)  . Latex Hives and Itching  . Minoxidil Nausea  . Nicardipine Hcl Unknown  . Simvastatin Unknown  . Sulfur (Not Sulfa) Itching  . Sulfa (Sulfonamide Antibiotics) Itching and Rash  . Sulfacetamide Sodium Itching and Rash  . Sulfasalazine Itching and Rash    REVIEW OF SYSTEMS  Comprehensive review of systems is otherwise negative except as indicated below and in the HPI  Constitutional: []  fever, []  rigors, []  nightsweats, []  unexpected weight change, []  fatigue, []  loud snoring, []  daytime somnolence  Opthalmologic: []  vision changes  Otolaryngologic: []  recent hearing changes  Oropharnyx: []  congestion []  oral sores/ulcers  Respiratory: []  dyspnea, []  cough, []  hemoptysis  Cardiovascular: []  chest pain, []  palpitations, []  orthopnea, []  PND,   [x]  peripheral edema, []  syncope, []  leg pain with walking  Gastrointestinal:              []  abdominal pain, []  nausea, []  vomiting, []  diarrhea,  []  constipation, []  melena, []  acid reflux  Genitourinary: []  dysuria, []  hematuria, []  nocturia  Musculoskeletal:             []  joint pain, []  myalgias  Neurologic:   Endocrine:            []  headache, []  neuropathy, []  localized weakness []  excessive urination, []  excessive thirst   Hematologic: []  bruising/bleeding  Dermatologic: []  skin excoriations []  rash   PHYSICAL EXAM   Vitals:   01/26/24 0939  BP: 132/58  Pulse: 54  Resp: 18        Wt Readings from Last 3 Encounters:  01/26/24 66.4 kg (146 lb 6.4 oz)  12/04/23 63.5 kg (140 lb)  07/27/23 63.5 kg (140 lb)     BP Readings from Last 3 Encounters:  01/26/24 132/58  12/04/23 (!) 145/66  07/27/23 (!) 175/52       General Appearance:  Alert, cooperative, Herman distress, appears stated age  HEENT:  PERRL,oropharynx clear  Neck: Herman carotid bruits, JVD appears normal  Lungs:   Clear to auscultation bilaterally, respirations unlabored  Heart:  Regular rate and rhythm, normal S1 and S2, II/VI systolic murmur, non-displaced PMI, Herman RV heave  Abdomen:   Soft, non-tender, bowel sounds active  Extremities: Herman cyanosis, Herman edema today  Pulses: Dorsalis pedis 2+ and symmetric bilaterally  Skin: Herman lower extremity rashes or ulcers  Neurologic: Alert, interactive, and appropriate, grossly moving all 4 extremities   DATA/RESULTS   Recent Labs    07/24/21 1145 01/21/23 1047  CHOLTOTAL 153 164  HDL 49 50  LDLCALC 83 88  TRIG 105 132    Recent Labs    04/03/21 0907 04/17/21 1212 07/24/21 1145 07/23/22 1027 01/21/23 1047 07/27/23 1109  NA 134*   < > 135 132* 133* 131*  K 4.5   < > 4.3 4.9 5.2* 4.5  CL 99   < > 101 96* 99 99  CO2 27   < > 24 20* 24 22  BUN 11   < > 14 18 16  22*  CREATININE 0.9   < > 0.9 1.4* 1.8* 1.7*  BUNCRE 12   < > 16 13 9 13   GFR 67   < > 67 39 29 31  GLUCOSE 109   < > 148* 163* 116 157*  MG 1.9  --  1.9  --   --  2.0  ALT  --   --  18 17 16 12   AST  --   --  22 21 21 17   TBILI  --   --  0.3* 0.6 0.3* 0.5  ALB  --   --  3.8 3.7 3.7 3.6   < > = values in this interval not displayed.    Recent Labs    07/23/22 1027 01/21/23 1047 07/27/23 1109  WBC 11.1* 9.0 9.5  HGB 10.4* 9.6* 9.4*  HCT 32.4* 30.4* 29.6*  PLT 374 376 396    Recent Labs    02/04/21 1136  03/14/21 1210 07/24/21 1145 07/23/22 1027 01/21/23 1047 07/27/23 1109  TSH 3.32  --    < > 0.80 30.19* 0.80  PROBNP 593 906*  --   --   --  561   < > = values in this interval not displayed.     Herman results for input(s): LIPOA in the last 73719 hours.   Herman results found for this visit on 01/26/24.   Cardiac Studies Results for orders placed or performed in visit on 07/27/23  ECG 12-lead   Collection Time: 07/27/23 10:45 AM  Result Value Ref Range   Vent Rate (bpm) 64    PR Interval (msec) 186    QRS Interval (msec) 86    QT Interval (msec) 432    QTc (msec) 445    Herman results found for this or any previous visit.  2021 Echo INTERPRETATION ---------------------------------------------------------------  NORMAL LEFT VENTRICULAR SYSTOLIC FUNCTION WITH MODERATE LVH   NORMAL LA PRESSURES WITH DIASTOLIC DYSFUNCTION   NORMAL RIGHT VENTRICULAR SYSTOLIC FUNCTION   VALVULAR REGURGITATION: TRIVIAL MR, MILD PR, MILD TR   Herman VALVULAR STENOSIS   TRIVIAL PERICARDIAL EFFUSION   MID CAVITARY GRADIENT=2.8m/s,26mmHg(Rest)/4.54m/s,70mmHg Valsalva   Herman PRIOR STUDY FOR COMPARISON  2020 [facility] 1. Moderate mid-LAD stenosis after the origin of the second diagonal  branch - favor medical therapy  2. Widely patent left main, left circumflex, and RCA  3. Normal LV systolic function and normal LVEDP   Pt with low BP throughout the procedure but awake and stable. Treated with  fluid bolus and discontinuation of IV NTG.   Will review with rounding team. Will resume eliquis  tonight. Consider  TEE/cardioversion tomorrow.    2022 CT Impression:   Herman significant renal artery stenosis. 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 ASSESSMENT AND PLAN  Ms. Titsworth presents today for follow up of the following problems:  1. On amiodarone  therapy -     Flow Volume Loop (Spirometry); Future -     PFD Panel - Lung Volumes; Future -     PFD Panel - Carbon monoxide diffusing  capacity (DLCO); Future -     PFD Panel - Maximal voluntary ventilation; Future -     ECG 12-lead; Future  2. Mixed hyperlipidemia -     ezetimibe (ZETIA) 10 mg tablet; Take 1 tablet (10 mg total) by mouth once daily  Dispense: 90 tablet; Refill: 3  3. Primary hypertension  4. Paroxysmal atrial fibrillation (CMS/HHS-HCC)  5. Coronary artery disease involving native coronary artery of native heart without angina pectoris  HTN- Long standing chronic HTN that was previously  difficult to control due to medication non-adherence and intolerances. Prior secondary w/u was nonrevealing. Continue meds as is and encourage adherence            -Current Regimen:                        - continue eplerenone 25mg  @ noon                       - continue metoprolol  XL 50mg  nightly                       - continue Prazosin  to 1mg  nightly                       - continue amlodipine  5mg  BID                       - continue losartan  50mg  twice daily             - PRN lasix- has not needed recently   Prior Intolerances:  - spiro/HCTZ - caused hyponatremia  - coreg  25 bid briefly stopped due to bradycardia 11/21- but restarted and switched to metoprolol  in the past - took dilt and verapamil  until 2021 but became intolerant (unclear side effect)  - intolerant to clonidine  -syncope and nausea   Paroxysmal atrial fibrillation Regular rhythm on exam today. Herman reports of AF- although I am not sure she remembers prior episodes. Continue amiodarone  at 100mg  now and apixaban  5mg  q12. She on her synthroid  and PCP is managing with local labs. Repeat PFTS in 3 months. Did not obtain EKG today and will obtain then.  CAD/ Mixed hyperlipidemia Nonobstructive CAD on cath in 2020. Denies anginal sx today. Staying active. Continues on pravastatin  40mg  daily and with last LDL >100 on local records, will add ezetimibe. Ideally with her DM her LDL would be less than 70.   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  Her aortic stenosis and sclerosis may also be causing the murmur that is heard.  Euvolemic on exam today.  Herman dizziness or syncope.  RTC March 2026 DISPOSITION   Herman follow-ups on file.  Future Appointments     Date/Time Provider Department Center Visit Type   04/14/2024 10:00 AM PFT LAB [facility] [place] SPIROMETRY/DLCO/LUNG VOLUMES   04/14/2024 11:15 AM (Arrive by 10:45 AM) NURSE ADULTS-SD CARD [facility] ELECTROCARDIOGRAM   08/08/2024 10:00 AM (Arrive by 9:30 AM) Clive Harlene Caldron, MD [facility]  CARD RETURN       I spent a total of 30 minutes in both face-to-face and non-face-to-face activities, excluding procedures performed, for this visit on the date of this encounter.  Medication list was reviewed with patient, changes/additions updated at time of visit, and prescriptions refilled appropriately per e-prescribing system. There were Herman barriers to communication. She understands to call with any questions or concerns. She was educated regarding dx, tx plan, medications and possible side effects. She was educated regarding emergent signs and symptoms, and knows to notify our office immediately or seek emergent medical attention if necessary. She voiced understanding and agreement with tx plan.   Attestation Statement:   I personally performed the service, non-incident to. (WP)   STEPHANIE MACARIO AO, NP       Follow-up     Normal Orders This Visit   Follow up in Cardiology [REF12F Custom]    Questions:   Who is this follow-up with?: Specific Provider   Provider: NICHOLLS, STEPHANIE LYNN   What visit type is needed?: Card Return   Allow telemedicine?: In Person Only   Does this order need to be coordinated with another visit or should it be hidden from the patient portal? If yes to either, the  patient will need to stop by the front desk or call to schedule.: Herman   Can this appointment be overbooked by a scheduler?: Herman             Portions of the record may have been created with voice recognition software. Occasional wrong-word or 'sound-a-like' substitutions may have occurred due to the inherent limitations of voice recognition software. Read the chart carefully and recognize, using context, where substitutions have occurred.    This note has been created using automated tools and reviewed for accuracy by STEPHANIE LYNN NICHOLLS.

## 2024-03-09 NOTE — Progress Notes (Signed)
 [facility]   [phone] Fax:([phone]   Clinic New Consult Note   Patient Care Team: [name], NP as PCP - General (Nurse Practitioner) Debera Jayson MATSU, MD as PCP - Cardiology (Cardiology) Harvey Margo CROME, MD (Inactive) as Consulting Physician (Gastroenterology) 03/09/2024  CHIEF COMPLAINTS/PURPOSE OF CONSULTATION: anemia   REFERRING PHYSICIAN: Rachele Gaynell RAMAN, MD   Discussed the use of AI scribe software for clinical note transcription with the patient, who gave verbal consent to proceed.  History of Present Illness      MEDICAL HISTORY:  Past Medical History:  Diagnosis Date   Allergic rhinitis    Anemia    Atrial fibrillation (HCC)    Diagnosed 2013   DM (diabetes mellitus), type 2 (HCC)    GERD (gastroesophageal reflux disease)    Hyperlipidemia    Hypertension    Hypothyroidism    Nephrolithiasis    Rotator cuff tear    Thyroid  disease    Transient ischemic attack (TIA)    Vitamin D  deficiency     SURGICAL HISTORY: Past Surgical History:  Procedure Laterality Date   BIOPSY  10/05/2021   Procedure: BIOPSY;  Surgeon: Elicia Claw, MD;  Location: [facility] ENDOSCOPY;  Service: Gastroenterology;;   BIOPSY  06/02/2022   Procedure: BIOPSY;  Surgeon: Cindie Carlin POUR, DO;  Location: [facility] ENDO SUITE;  Service: Endoscopy;;   CARDIOVERSION N/A 02/22/2019   Procedure: CARDIOVERSION;  Surgeon: Kate Lonni CROME, MD;  Location: [facility] ENDOSCOPY;  Service: Endoscopy;  Laterality: N/A;   CHOLECYSTECTOMY     COLONOSCOPY     normal per patient, more than 10 years ago   COLONOSCOPY WITH PROPOFOL  N/A 06/02/2022   Procedure: COLONOSCOPY WITH PROPOFOL ;  Surgeon: Cindie Carlin POUR, DO;  Location: [facility];  Service: Endoscopy;  Laterality: N/A;  7:30 am   ESOPHAGOGASTRODUODENOSCOPY (EGD) WITH PROPOFOL  N/A 10/05/2021   Procedure: ESOPHAGOGASTRODUODENOSCOPY (EGD) WITH PROPOFOL ;  Surgeon: Elicia Claw, MD;  Location: [facility]  ENDOSCOPY;  Service: Gastroenterology;  Laterality: N/A;   HEMOSTASIS CLIP PLACEMENT  10/05/2021   Procedure: HEMOSTASIS CLIP PLACEMENT;  Surgeon: Elicia Claw, MD;  Location: [facility] ENDOSCOPY;  Service: Gastroenterology;;   LEFT HEART CATH AND CORONARY ANGIOGRAPHY N/A 02/21/2019   Procedure: LEFT HEART CATH AND CORONARY ANGIOGRAPHY;  Surgeon: Wonda Sharper, MD;  Location: [facility] INVASIVE CV LAB;  Service: Cardiovascular;  Laterality: N/A;   POLYPECTOMY  10/05/2021   Procedure: POLYPECTOMY;  Surgeon: Elicia Claw, MD;  Location: [facility] ENDOSCOPY;  Service: Gastroenterology;;   POLYPECTOMY  06/02/2022   Procedure: POLYPECTOMY;  Surgeon: Cindie Carlin POUR, DO;  Location: [facility];  Service: Endoscopy;;   TEE WITHOUT CARDIOVERSION N/A 02/22/2019   Procedure: TRANSESOPHAGEAL ECHOCARDIOGRAM (TEE);  Surgeon: Kate Lonni CROME, MD;  Location: [facility] ENDOSCOPY;  Service: Endoscopy;  Laterality: N/A;    SOCIAL HISTORY: Social History   Socioeconomic History   Marital status: Widowed    Spouse name: Not on file   Number of children: Not on file   Years of education: Not on file   Highest education level: Not on file  Occupational History   Not on file  Tobacco Use   Smoking status: Never   Smokeless tobacco: Never  Vaping Use   Vaping status: Never Used  Substance and Sexual Activity   Alcohol  use: No   Drug use: No   Sexual activity: Not Currently  Other Topics Concern   Not on file  Social History Narrative   Not on file   Social Drivers  of Health   Financial Resource Strain: Low Risk  (04/01/2020)   Received from [facility]   Overall Financial Resource Strain (CARDIA)    Difficulty of Paying Living Expenses: Not very hard  Food Insecurity: No Food Insecurity (04/01/2020)   Received from [facility]   Hunger Vital Sign    Within the past 12 months, you worried that your food would run out before you got the money to buy more.: Never true    Within the past 12 months, the  food you bought just didn't last and you didn't have money to get more.: Never true  Transportation Needs: No Transportation Needs (03/17/2021)   Received from [facility] - Transportation    In the past 12 months, has lack of transportation kept you from medical appointments or from getting medications?: No    Lack of Transportation (Non-Medical): No  Physical Activity: Unknown (02/20/2019)   Exercise Vital Sign    Days of Exercise per Week: Patient declined    Minutes of Exercise per Session: Patient declined  Stress: Stress Concern Present (02/20/2019)   [employer] [facility] [employer] - Occupational Stress Questionnaire    Feeling of Stress : Rather much  Social Connections: Unknown (10/03/2021)   Received from [facility]   Social Network    Social Network: Not on file  Intimate Partner Violence: Unknown (09/05/2021)   Received from [facility]   HITS    Physically Hurt: Not on file    Insult or Talk Down To: Not on file    Threaten Physical Harm: Not on file    Scream or Curse: Not on file    FAMILY HISTORY: Family History  Problem Relation Age of Onset   Heart disease Mother    Alzheimer's disease Mother    Diabetes Mother    Skin cancer Mother    Hypertension Father    Diabetes Father    Heart disease Brother    Breast cancer Daughter    Colon cancer Neg Hx     ALLERGIES:  is allergic to angiotensin receptor blockers, calcium channel blockers, elemental sulfur, latex, minoxidil, nicardipine hcl, simvastatin, clonidine , sulfa antibiotics, sulfacetamide sodium, and sulfasalazine.  MEDICATIONS:  Current Outpatient Medications  Medication Sig Dispense Refill   acetaminophen  (TYLENOL ) 325 MG tablet Take 2 tablets (650 mg total) by mouth every 8 (eight) hours as needed. 60 tablet 0   amiodarone  (PACERONE ) 200 MG tablet TAKE 1 TABLET BY MOUTH ONCE DAILY. 30 tablet 6   amLODipine  (NORVASC ) 5 MG tablet Take 5 mg by mouth 2 (two) times  daily.     apixaban  (ELIQUIS ) 5 MG TABS tablet Take 1 tablet (5 mg total) by mouth 2 (two) times daily. 60 tablet 1   clotrimazole (LOTRIMIN) 1 % cream Apply topically 2 (two) times daily.     cyclobenzaprine  (FLEXERIL ) 10 MG tablet 1 tablet as needed Orally Three times a day; Duration: 10 days     dexlansoprazole (DEXILANT) 60 MG capsule Take 1 capsule by mouth every morning.     eplerenone (INSPRA) 25 MG tablet Take 25 mg by mouth daily.     ergocalciferol  (VITAMIN D2) 1.25 MG (50000 UT) capsule Take 50,000 Units by mouth once a week.     FEROSUL 325 (65 Fe) MG tablet Take 325 mg by mouth daily.      levothyroxine  (SYNTHROID ) 100 MCG tablet Take 100 mcg by mouth daily.     losartan  (COZAAR ) 50 MG  tablet Take 1 tablet (50 mg total) by mouth 2 (two) times daily. 180 tablet 3   magnesium  oxide (MAG-OX) 400 (241.3 Mg) MG tablet TAKE 1 TABLET BY MOUTH ONCE DAILY. 28 tablet 11   metoprolol  succinate (TOPROL -XL) 50 MG 24 hr tablet Take 50 mg by mouth daily.     metroNIDAZOLE  (METROCREAM ) 0.75 % cream      ondansetron  (ZOFRAN -ODT) 4 MG disintegrating tablet Take 1 tablet (4 mg total) by mouth every 8 (eight) hours as needed for nausea or vomiting. 10 tablet 0   pravastatin  (PRAVACHOL ) 40 MG tablet Take 40 mg by mouth at bedtime.      prazosin  (MINIPRESS ) 2 MG capsule TAKE TWO TABLETS BY MOUTH AT BEDTIME; Duration: 30 days     scopolamine (TRANSDERM-SCOP) 1 MG/3DAYS 1 patch every 3 (three) days.     traMADol  (ULTRAM ) 50 MG tablet Take 50 mg by mouth every 6 (six) hours as needed for severe pain or moderate pain.     TRESIBA  FLEXTOUCH 200 UNIT/ML SOPN Inject 0-20 Units into the skin at bedtime.     triamcinolone (KENALOG) 0.025 % cream Apply topically.     vitamin B-12 (CYANOCOBALAMIN) 1000 MCG tablet Take 1,000 mcg by mouth daily.      No current facility-administered medications for this visit.    REVIEW OF SYSTEMS:   Constitutional: Denies fevers, chills or abnormal night sweats Eyes: Denies  blurriness of vision, double vision or watery eyes Ears, nose, mouth, throat, and face: Denies mucositis or sore throat Respiratory: Denies cough, dyspnea or wheezes Cardiovascular: Denies palpitation, chest discomfort or lower extremity swelling Gastrointestinal:  Denies nausea, heartburn or change in bowel habits Skin: Denies abnormal skin rashes Lymphatics: Denies new lymphadenopathy or easy bruising Neurological:Denies numbness, tingling or new weaknesses Behavioral/Psych: Mood is stable, no new changes  All other systems were reviewed with the patient and are negative.  PHYSICAL EXAMINATION: ECOG PERFORMANCE STATUS: {CHL ONC ECOG PS:[id number]}  There were no vitals filed for this visit. There were no vitals filed for this visit.  GENERAL:alert, no distress and comfortable SKIN: skin color, texture, turgor are normal, no rashes or significant lesions EYES: normal, conjunctiva are pink and non-injected, sclera clear OROPHARYNX:no exudate, no erythema and lips, buccal mucosa, and tongue normal  NECK: supple, thyroid  normal size, non-tender, without nodularity LYMPH:  no palpable lymphadenopathy in the cervical, axillary or inguinal LUNGS: clear to auscultation and percussion with normal breathing effort HEART: regular rate & rhythm and no murmurs and no lower extremity edema ABDOMEN:abdomen soft, non-tender and normal bowel sounds Musculoskeletal:no cyanosis of digits and no clubbing  PSYCH: alert & oriented x 3 with fluent speech NEURO: no focal motor/sensory deficits  Physical Exam   LABORATORY DATA:  I have reviewed the data as listed    Latest Ref Rng & Units 12/04/2023    1:15 PM 05/08/2022    2:10 PM 03/25/2022   10:59 AM  CBC  WBC 4.0 - 10.5 K/uL 10.0  16.3  8.7   Hemoglobin 12.0 - 15.0 g/dL 9.4  9.8  9.5   Hematocrit 36.0 - 46.0 % 28.7  30.1  28.1   Platelets 150 - 400 K/uL 323  409  336     @cmpl @  RADIOGRAPHIC STUDIES: I have personally reviewed the  radiological images as listed and agreed with the findings in the report. No results found.  ASSESSMENT & PLAN:   Assessment and Plan Assessment & Plan      No orders of the defined  types were placed in this encounter.   All questions were answered. The patient knows to call the clinic with any problems, questions or concerns. I spent {CHL ONC TIME VISIT - DTPQU:[id number]} counseling the patient face to face. The total time spent in the appointment was {CHL ONC TIME VISIT - DTPQU:[id number]} including review of chart and various tests results, discussions about plan of care and coordination of care plan.     Onita Mattock, MD 03/09/2024 9:44 PM

## 2024-03-10 ENCOUNTER — Inpatient Hospital Stay

## 2024-03-10 ENCOUNTER — Encounter: Payer: Self-pay | Admitting: Hematology

## 2024-03-10 ENCOUNTER — Inpatient Hospital Stay: Attending: Hematology | Admitting: Hematology

## 2024-03-10 VITALS — BP 135/52 | HR 57 | Temp 97.7°F | Resp 18 | Wt 147.6 lb

## 2024-03-10 DIAGNOSIS — I129 Hypertensive chronic kidney disease with stage 1 through stage 4 chronic kidney disease, or unspecified chronic kidney disease: Secondary | ICD-10-CM | POA: Diagnosis not present

## 2024-03-10 DIAGNOSIS — M19041 Primary osteoarthritis, right hand: Secondary | ICD-10-CM | POA: Diagnosis not present

## 2024-03-10 DIAGNOSIS — E1122 Type 2 diabetes mellitus with diabetic chronic kidney disease: Secondary | ICD-10-CM | POA: Insufficient documentation

## 2024-03-10 DIAGNOSIS — I4891 Unspecified atrial fibrillation: Secondary | ICD-10-CM | POA: Diagnosis not present

## 2024-03-10 DIAGNOSIS — M19042 Primary osteoarthritis, left hand: Secondary | ICD-10-CM | POA: Diagnosis not present

## 2024-03-10 DIAGNOSIS — N189 Chronic kidney disease, unspecified: Secondary | ICD-10-CM | POA: Diagnosis not present

## 2024-03-10 DIAGNOSIS — D649 Anemia, unspecified: Secondary | ICD-10-CM

## 2024-03-10 DIAGNOSIS — D631 Anemia in chronic kidney disease: Secondary | ICD-10-CM | POA: Insufficient documentation

## 2024-03-10 DIAGNOSIS — Z7901 Long term (current) use of anticoagulants: Secondary | ICD-10-CM | POA: Diagnosis not present

## 2024-03-10 LAB — CBC WITH DIFFERENTIAL/PLATELET
Abs Immature Granulocytes: 0.03 K/uL (ref 0.00–0.07)
Basophils Absolute: 0 K/uL (ref 0.0–0.1)
Basophils Relative: 0 %
Eosinophils Absolute: 0.1 K/uL (ref 0.0–0.5)
Eosinophils Relative: 1 %
HCT: 35.3 % — ABNORMAL LOW (ref 36.0–46.0)
Hemoglobin: 11.2 g/dL — ABNORMAL LOW (ref 12.0–15.0)
Immature Granulocytes: 0 %
Lymphocytes Relative: 14 %
Lymphs Abs: 1.4 K/uL (ref 0.7–4.0)
MCH: 31.2 pg (ref 26.0–34.0)
MCHC: 31.7 g/dL (ref 30.0–36.0)
MCV: 98.3 fL (ref 80.0–100.0)
Monocytes Absolute: 0.8 K/uL (ref 0.1–1.0)
Monocytes Relative: 8 %
Neutro Abs: 7.6 K/uL (ref 1.7–7.7)
Neutrophils Relative %: 77 %
Platelets: 319 K/uL (ref 150–400)
RBC: 3.59 MIL/uL — ABNORMAL LOW (ref 3.87–5.11)
RDW: 13.1 % (ref 11.5–15.5)
WBC: 9.9 K/uL (ref 4.0–10.5)
nRBC: 0 % (ref 0.0–0.2)

## 2024-03-10 LAB — RETIC PANEL
Immature Retic Fract: 6.3 % (ref 2.3–15.9)
RBC.: 3.57 MIL/uL — ABNORMAL LOW (ref 3.87–5.11)
Retic Count, Absolute: 42.5 K/uL (ref 19.0–186.0)
Retic Ct Pct: 1.2 % (ref 0.4–3.1)
Reticulocyte Hemoglobin: 34.4 pg (ref >27.9)

## 2024-03-10 LAB — IRON AND TIBC
Iron: 54 ug/dL (ref 28–170)
Saturation Ratios: 15 % (ref 10.4–31.8)
TIBC: 364 ug/dL (ref 250–450)
UIBC: 310 ug/dL

## 2024-03-10 LAB — FERRITIN: Ferritin: 115 ng/mL (ref 11–307)

## 2024-03-10 LAB — FOLATE: Folate: 12 ng/mL (ref >5.9)

## 2024-03-10 LAB — VITAMIN B12: Vitamin B-12: 592 pg/mL (ref 180–914)

## 2024-03-11 LAB — ERYTHROPOIETIN: Erythropoietin: 7 m[IU]/mL (ref 2.6–18.5)

## 2024-03-13 LAB — KAPPA/LAMBDA LIGHT CHAINS
Kappa free light chain: 53.1 mg/L — ABNORMAL HIGH (ref 3.3–19.4)
Kappa, lambda light chain ratio: 1.65 (ref 0.26–1.65)
Lambda free light chains: 32.2 mg/L — ABNORMAL HIGH (ref 5.7–26.3)

## 2024-03-19 LAB — METHYLMALONIC ACID, SERUM: Methylmalonic Acid, Quantitative: 317 nmol/L (ref 0–378)

## 2024-03-29 ENCOUNTER — Inpatient Hospital Stay: Admitting: Oncology

## 2024-03-29 VITALS — BP 181/46 | HR 57 | Temp 98.7°F | Resp 17 | Ht 64.0 in | Wt 147.0 lb

## 2024-03-29 DIAGNOSIS — D508 Other iron deficiency anemias: Secondary | ICD-10-CM | POA: Diagnosis not present

## 2024-03-29 DIAGNOSIS — N189 Chronic kidney disease, unspecified: Secondary | ICD-10-CM | POA: Insufficient documentation

## 2024-03-29 MED ORDER — VITAMIN C 250 MG PO TABS: 250.0000 mg | ORAL_TABLET | Freq: Every day | ORAL | 0 refills | Status: AC

## 2024-03-29 NOTE — Progress Notes (Signed)
 [facility] OFFICE PROGRESS NOTE  Amber Morna FALCON, NP  ASSESSMENT & PLAN:  Assessment & Plan Other iron deficiency anemia Patient has had stable anemia between 9 and 10 over the past 2 years. Denies any bright red blood per rectum, melena or hematochezia. Reports she has had a colonoscopy last was on 06/02/2022 and EGD on 10/05/2021. She is on Eliquis  but denies any bleeding that she is aware of. She is currently taking iron, B12 and vitamin D  supplements. Labs from 03/10/2024 show normal B12 and MMA, ferritin 115 iron saturation is 15% with normal TIBC, erythropoietin 7.0, no evidence of hemolysis, folate normal.  Hemoglobin 11.2 (9.4).  Differential unremarkable.  Myeloma labs negative. No need for bone marrow biopsy at this time. We discussed iron saturations are a little bit on the low side and would prefer them closer to 30% in the setting of CKD but given improvement of hemoglobin, patient would like to continue oral iron instead of trying IV iron.  We discussed rechecking labs in 3 months just to make sure she is not going in the wrong direction.  She will return to clinic in 6 months with labs and see me back. Continue iron, B12 and vitamin D  supplements.  Discussed adding vitamin C with iron to increase absorption.  New prescription sent over. Chronic kidney disease, unspecified CKD stage Chronic kidney disease likely secondary to hypertension and diabetes. Contributing to anemia due to decreased erythropoietin production.  Erythropoietin level 7.0.  Given improvement of hemoglobin, no need for EPO at this time.  Orders Placed This Encounter  Procedures   Ferritin    Standing Status:   Future    Expected Date:   06/29/2024    Expiration Date:   09/27/2024   Iron and TIBC    Standing Status:   Future    Expected Date:   06/29/2024    Expiration Date:   09/27/2024   Vitamin B12    Standing Status:   Future    Expected Date:   06/29/2024    Expiration Date:    09/27/2024   CBC with Differential/Platelet    Standing Status:   Future    Expected Date:   06/29/2024    Expiration Date:   09/27/2024   Methylmalonic acid, serum    Standing Status:   Future    Expected Date:   06/29/2024    Expiration Date:   09/27/2024   Ferritin    Standing Status:   Future    Expected Date:   09/27/2024    Expiration Date:   12/26/2024   Iron and TIBC    Standing Status:   Future    Expected Date:   09/27/2024    Expiration Date:   12/26/2024   Vitamin B12    Standing Status:   Future    Expected Date:   09/27/2024    Expiration Date:   12/26/2024   CBC with Differential/Platelet    Standing Status:   Future    Expected Date:   09/27/2024    Expiration Date:   12/26/2024   Methylmalonic acid, serum    Standing Status:   Future    Expected Date:   09/27/2024    Expiration Date:   12/26/2024   INTERVAL HISTORY: [facility] is a 78 year old female with anemia who presents for a new consult. She is accompanied by her daughter, Amber [facility]. She was referred by her kidney doctor, Dr. Judith, for evaluation of  low blood counts.   She experiences symptoms of anemia, including feeling cold and tired. Her hemoglobin is 9.8 g/dL, with a previous level of 9.4 g/dL in July 7974. She takes oral iron and has never had a blood transfusion. There is no history of bleeding or blood donation.   Her medical history includes hypertension and diabetes, managed with losartan , metoprolol , amlodipine , and insulin . She has atrial fibrillation, treated with Eliquis  and amiodarone . She takes pantoprazole  for acid reflux and prazosin . She underwent a colonoscopy in 2024 with multiple polyps removed and an upper endoscopy in May 2023 for hematemesis, revealing stomach polyps and inflammation. She experiences chronic gassiness.   Her family history includes anemia in her parents. She does not smoke or drink alcohol . No bleeding, blood in stool, or melena. Occasional swelling in her feet.  SUMMARY OF  HEMATOLOGIC HISTORY: Oncology History   No history exists.     CBC    Component Value Date/Time   WBC 9.9 03/10/2024 0943   RBC 3.57 (L) 03/10/2024 0943   RBC 3.59 (L) 03/10/2024 0943   HGB 11.2 (L) 03/10/2024 0943   HGB 9.8 (L) 05/08/2022 1410   HCT 35.3 (L) 03/10/2024 0943   HCT 30.1 (L) 05/08/2022 1410   PLT 319 03/10/2024 0943   PLT 409 05/08/2022 1410   MCV 98.3 03/10/2024 0943   MCV 95 05/08/2022 1410   MCH 31.2 03/10/2024 0943   MCHC 31.7 03/10/2024 0943   RDW 13.1 03/10/2024 0943   RDW 12.3 05/08/2022 1410   LYMPHSABS 1.4 03/10/2024 0943   MONOABS 0.8 03/10/2024 0943   EOSABS 0.1 03/10/2024 0943   BASOSABS 0.0 03/10/2024 0943       Latest Ref Rng & Units 12/04/2023    1:15 PM 05/28/2022   10:29 AM 10/14/2021    4:38 PM  CMP  Glucose 70 - 99 mg/dL 878  895  95   BUN 8 - 23 mg/dL 34  24  21   Creatinine 0.44 - 1.00 mg/dL 7.92  8.34  8.84   Sodium 135 - 145 mmol/L 132  132  132   Potassium 3.5 - 5.1 mmol/L 4.4  4.5  4.7   Chloride 98 - 111 mmol/L 96  98  96   CO2 22 - 32 mmol/L 25  25  24    Calcium 8.9 - 10.3 mg/dL 8.9  8.8  9.5   Total Protein 6.5 - 8.1 g/dL 7.6   7.4   Total Bilirubin 0.0 - 1.2 mg/dL 0.2   0.4   Alkaline Phos 38 - 126 U/L 69   60   AST 15 - 41 U/L 15   21   ALT 0 - 44 U/L 13   18      Lab Results  Component Value Date   FERRITIN 115 03/10/2024   VITAMINB12 592 03/10/2024    Vitals:   03/29/24 1317 03/29/24 1324  BP: (!) 174/50 (!) 181/46  Pulse: (!) 57   Resp: 17   Temp: 98.7 F (37.1 C)   SpO2: 100%     Review of System:  Review of Systems  Constitutional:  Positive for malaise/fatigue.  Gastrointestinal:  Negative for abdominal pain, blood in stool, diarrhea, nausea and vomiting.  Musculoskeletal:  Positive for joint pain.  Neurological:  Positive for sensory change.    Physical Exam: Physical Exam Constitutional:      Appearance: Normal appearance. She is obese.  HENT:     Head: Normocephalic and atraumatic.   Eyes:  Pupils: Pupils are equal, round, and reactive to light.  Cardiovascular:     Rate and Rhythm: Normal rate and regular rhythm.     Heart sounds: Normal heart sounds. No murmur heard. Pulmonary:     Effort: Pulmonary effort is normal.     Breath sounds: Normal breath sounds. No wheezing.  Abdominal:     General: Bowel sounds are normal. There is no distension.     Palpations: Abdomen is soft.     Tenderness: There is no abdominal tenderness.  Musculoskeletal:        General: Normal range of motion.     Cervical back: Normal range of motion.  Skin:    General: Skin is warm and dry.     Findings: No rash.  Neurological:     Mental Status: She is alert and oriented to person, place, and time.     Gait: Gait is intact.  Psychiatric:        Mood and Affect: Mood and affect normal.        Cognition and Memory: Memory normal.        Judgment: Judgment normal.      I spent 25 minutes dedicated to the care of this patient (face-to-face and non-face-to-face) on the date of the encounter to include what is described in the assessment and plan.,  Delon Hope, NP 03/29/2024 1:50 PM

## 2024-03-29 NOTE — Assessment & Plan Note (Addendum)
 Patient has had stable anemia between 9 and 10 over the past 2 years. Denies any bright red blood per rectum, melena or hematochezia. Reports she has had a colonoscopy last was on 06/02/2022 and EGD on 10/05/2021. She is on Eliquis  but denies any bleeding that she is aware of. She is currently taking iron, B12 and vitamin D  supplements. Labs from 03/10/2024 show normal B12 and MMA, ferritin 115 iron saturation is 15% with normal TIBC, erythropoietin 7.0, no evidence of hemolysis, folate normal.  Hemoglobin 11.2 (9.4).  Differential unremarkable.  Myeloma labs negative. No need for bone marrow biopsy at this time. We discussed iron saturations are a little bit on the low side and would prefer them closer to 30% in the setting of CKD but given improvement of hemoglobin, patient would like to continue oral iron instead of trying IV iron.  We discussed rechecking labs in 3 months just to make sure she is not going in the wrong direction.  She will return to clinic in 6 months with labs and see me back. Continue iron, B12 and vitamin D  supplements.  Discussed adding vitamin C with iron to increase absorption.  New prescription sent over.

## 2024-03-29 NOTE — Assessment & Plan Note (Addendum)
 Chronic kidney disease likely secondary to hypertension and diabetes. Contributing to anemia due to decreased erythropoietin production.  Erythropoietin level 7.0.  Given improvement of hemoglobin, no need for EPO at this time.

## 2024-06-09 ENCOUNTER — Other Ambulatory Visit: Payer: Self-pay | Admitting: Oncology

## 2024-06-09 DIAGNOSIS — N189 Chronic kidney disease, unspecified: Secondary | ICD-10-CM

## 2024-06-09 DIAGNOSIS — D508 Other iron deficiency anemias: Secondary | ICD-10-CM

## 2024-06-29 ENCOUNTER — Inpatient Hospital Stay

## 2024-07-05 ENCOUNTER — Inpatient Hospital Stay

## 2024-09-27 ENCOUNTER — Inpatient Hospital Stay: Attending: Hematology

## 2024-10-04 ENCOUNTER — Inpatient Hospital Stay: Attending: Hematology | Admitting: Oncology
